# Patient Record
Sex: Female | Born: 1950 | Race: White | Hispanic: No | Marital: Single | Smoking: Current every day smoker
Health system: Southern US, Community
[De-identification: ages and names within clinical notes are randomized; demographics above are authoritative.]

## PROBLEM LIST (undated history)

## (undated) ENCOUNTER — Encounter: Attending: Anesthesiology | Primary: Anesthesiology

## (undated) ENCOUNTER — Telehealth

## (undated) ENCOUNTER — Telehealth
Attending: Student in an Organized Health Care Education/Training Program | Primary: Student in an Organized Health Care Education/Training Program

## (undated) ENCOUNTER — Ambulatory Visit: Payer: MEDICARE

## (undated) ENCOUNTER — Encounter

## (undated) ENCOUNTER — Ambulatory Visit

## (undated) ENCOUNTER — Ambulatory Visit
Payer: MEDICARE | Attending: Student in an Organized Health Care Education/Training Program | Primary: Student in an Organized Health Care Education/Training Program

## (undated) ENCOUNTER — Ambulatory Visit: Payer: Medicare (Managed Care)

## (undated) ENCOUNTER — Encounter: Attending: Physical Medicine & Rehabilitation | Primary: Physical Medicine & Rehabilitation

## (undated) ENCOUNTER — Ambulatory Visit
Payer: Medicare (Managed Care) | Attending: Student in an Organized Health Care Education/Training Program | Primary: Student in an Organized Health Care Education/Training Program

## (undated) ENCOUNTER — Telehealth: Attending: Otolaryngology | Primary: Otolaryngology

## (undated) ENCOUNTER — Encounter
Attending: Student in an Organized Health Care Education/Training Program | Primary: Student in an Organized Health Care Education/Training Program

## (undated) ENCOUNTER — Encounter: Attending: Cardiovascular Disease | Primary: Cardiovascular Disease

## (undated) ENCOUNTER — Encounter: Payer: MEDICARE | Attending: Physical Medicine & Rehabilitation | Primary: Physical Medicine & Rehabilitation

## (undated) ENCOUNTER — Telehealth: Attending: Federally Qualified Health Center (FQHC) | Primary: Federally Qualified Health Center (FQHC)

## (undated) ENCOUNTER — Ambulatory Visit: Payer: Medicare (Managed Care) | Attending: Pulmonary Disease | Primary: Pulmonary Disease

## (undated) ENCOUNTER — Encounter: Attending: Radiation Oncology | Primary: Radiation Oncology

## (undated) ENCOUNTER — Ambulatory Visit: Payer: MEDICARE | Attending: Physical Medicine & Rehabilitation | Primary: Physical Medicine & Rehabilitation

## (undated) ENCOUNTER — Encounter: Attending: Audiologist | Primary: Audiologist

## (undated) ENCOUNTER — Encounter: Attending: Naturopath | Primary: Naturopath

## (undated) ENCOUNTER — Ambulatory Visit: Attending: Physical Medicine & Rehabilitation | Primary: Physical Medicine & Rehabilitation

## (undated) ENCOUNTER — Non-Acute Institutional Stay: Payer: MEDICARE | Attending: Physical Medicine & Rehabilitation | Primary: Physical Medicine & Rehabilitation

## (undated) ENCOUNTER — Encounter: Payer: MEDICARE | Attending: Medical | Primary: Medical

## (undated) ENCOUNTER — Telehealth: Attending: Physical Medicine & Rehabilitation | Primary: Physical Medicine & Rehabilitation

## (undated) ENCOUNTER — Ambulatory Visit: Payer: MEDICARE | Attending: Otolaryngology | Primary: Otolaryngology

## (undated) ENCOUNTER — Ambulatory Visit: Attending: Family Medicine | Primary: Family Medicine

## (undated) ENCOUNTER — Encounter: Payer: MEDICARE | Attending: Cardiovascular Disease | Primary: Cardiovascular Disease

## (undated) ENCOUNTER — Encounter: Attending: Family Medicine | Primary: Family Medicine

## (undated) ENCOUNTER — Encounter: Attending: Spinal Cord Injury Medicine | Primary: Spinal Cord Injury Medicine

## (undated) DIAGNOSIS — C801 Malignant (primary) neoplasm, unspecified: Secondary | ICD-10-CM

## (undated) DIAGNOSIS — I10 Essential (primary) hypertension: Secondary | ICD-10-CM

---
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
## 2011-09-29 DIAGNOSIS — F17200 Nicotine dependence, unspecified, uncomplicated: Secondary | ICD-10-CM | POA: Insufficient documentation

## 2011-09-29 DIAGNOSIS — I70209 Unspecified atherosclerosis of native arteries of extremities, unspecified extremity: Secondary | ICD-10-CM | POA: Insufficient documentation

## 2011-09-29 DIAGNOSIS — E782 Mixed hyperlipidemia: Secondary | ICD-10-CM | POA: Insufficient documentation

## 2015-07-20 DIAGNOSIS — I10 Essential (primary) hypertension: Secondary | ICD-10-CM | POA: Insufficient documentation

## 2016-04-24 DIAGNOSIS — Z5320 Procedure and treatment not carried out because of patient's decision for unspecified reasons: Secondary | ICD-10-CM | POA: Insufficient documentation

## 2016-09-21 ENCOUNTER — Ambulatory Visit
Admission: RE | Admit: 2016-09-21 | Discharge: 2016-09-21 | Disposition: A | Discharge status: Home / Self Care | Attending: Physician Assistant

## 2016-09-21 DIAGNOSIS — G8929 Other chronic pain: Secondary | ICD-10-CM

## 2016-09-21 DIAGNOSIS — M545 Low back pain: Principal | ICD-10-CM

## 2016-09-21 MED ORDER — GABAPENTIN 300 MG CAPSULE
ORAL_CAPSULE | 2 refills | 0 days | Status: CP
Start: 2016-09-21 — End: 2016-11-02

## 2016-10-17 MED ORDER — CYCLOBENZAPRINE 10 MG TABLET
ORAL_TABLET | Freq: Every evening | ORAL | 1 refills | 0.00000 days | Status: SS
Start: 2016-10-17 — End: 2017-04-04

## 2016-10-17 MED ORDER — LISINOPRIL 20 MG-HYDROCHLOROTHIAZIDE 25 MG TABLET
ORAL_TABLET | Freq: Every day | ORAL | 1 refills | 0.00000 days | Status: SS
Start: 2016-10-17 — End: 2017-04-23

## 2016-10-17 MED ORDER — AMLODIPINE 5 MG TABLET
ORAL_TABLET | Freq: Every day | ORAL | 1 refills | 0.00000 days | Status: CP
Start: 2016-10-17 — End: 2017-04-27

## 2016-10-23 ENCOUNTER — Ambulatory Visit
Admission: RE | Admit: 2016-10-23 | Discharge: 2016-10-26 | Disposition: A | Discharge status: Home / Self Care | Attending: Rehabilitative and Restorative Service Providers" | Admitting: Rehabilitative and Restorative Service Providers"

## 2016-10-23 DIAGNOSIS — G8929 Other chronic pain: Secondary | ICD-10-CM

## 2016-10-23 DIAGNOSIS — M545 Low back pain: Principal | ICD-10-CM

## 2016-11-02 ENCOUNTER — Ambulatory Visit: Admission: RE | Admit: 2016-11-02 | Discharge: 2016-11-02

## 2016-11-02 DIAGNOSIS — H43811 Vitreous degeneration, right eye: Principal | ICD-10-CM

## 2017-01-29 MED ORDER — DICLOFENAC SODIUM 75 MG TABLET,DELAYED RELEASE
ORAL_TABLET | Freq: Two times a day (BID) | ORAL | 2 refills | 0 days | Status: CP
Start: 2017-01-29 — End: 2017-04-17

## 2017-04-02 DIAGNOSIS — G40901 Epilepsy, unspecified, not intractable, with status epilepticus: Principal | ICD-10-CM

## 2017-04-03 ENCOUNTER — Ambulatory Visit
Admit: 2017-04-03 | Discharge: 2017-04-17 | Disposition: A | Discharge status: Rehabilitation Facility | Payer: MEDICARE | Admitting: Neurology

## 2017-04-03 DIAGNOSIS — G40901 Epilepsy, unspecified, not intractable, with status epilepticus: Principal | ICD-10-CM

## 2017-04-04 DIAGNOSIS — G40901 Epilepsy, unspecified, not intractable, with status epilepticus: Principal | ICD-10-CM

## 2017-04-05 DIAGNOSIS — G40901 Epilepsy, unspecified, not intractable, with status epilepticus: Principal | ICD-10-CM

## 2017-04-06 DIAGNOSIS — G40901 Epilepsy, unspecified, not intractable, with status epilepticus: Principal | ICD-10-CM

## 2017-04-17 ENCOUNTER — Ambulatory Visit
Admission: TF | Admit: 2017-04-17 | Discharge: 2017-04-27 | Disposition: A | Discharge status: Home / Self Care | Payer: MEDICARE | Source: Intra-hospital | Admitting: Spinal Cord Injury Medicine

## 2017-04-17 MED ORDER — MULTIVITAMIN-IRON 9 MG-FOLIC ACID 400 MCG-CALCIUM AND MINERALS TABLET
Freq: Every day | ORAL | 0 refills | 0.00000 days
Start: 2017-04-17 — End: 2017-04-27

## 2017-04-17 MED ORDER — ALBUTEROL SULFATE 2.5 MG/3 ML (0.083 %) SOLUTION FOR NEBULIZATION
Freq: Three times a day (TID) | RESPIRATORY_TRACT | 11 refills | 0.00000 days
Start: 2017-04-17 — End: 2017-04-27

## 2017-04-17 MED ORDER — FLUTICASONE 250 MCG-SALMETEROL 50 MCG/DOSE BLISTR POWDR FOR INHALATION
Freq: Two times a day (BID) | RESPIRATORY_TRACT | 11 refills | 0.00000 days
Start: 2017-04-17 — End: 2017-04-27

## 2017-04-17 MED ORDER — NICOTINE (POLACRILEX) 4 MG BUCCAL LOZENGE
Freq: Every day | BUCCAL | 0 refills | 0.00000 days | PRN
Start: 2017-04-17 — End: 2017-04-27

## 2017-04-17 MED ORDER — CHLORDIAZEPOXIDE 25 MG CAPSULE
ORAL_CAPSULE | Freq: Two times a day (BID) | ORAL | 0 refills | 0.00000 days | Status: SS
Start: 2017-04-17 — End: 2017-04-20

## 2017-04-17 MED ORDER — CITALOPRAM 10 MG TABLET
ORAL_TABLET | Freq: Every day | ORAL | 0 refills | 0.00000 days
Start: 2017-04-17 — End: 2017-04-27

## 2017-04-17 MED ORDER — HYDROXYZINE HCL 25 MG TABLET
ORAL_CAPSULE | Freq: Four times a day (QID) | ORAL | 0 refills | 0.00000 days | PRN
Start: 2017-04-17 — End: 2017-04-27

## 2017-04-17 MED ORDER — FOLIC ACID 1 MG TABLET
ORAL_TABLET | Freq: Every day | ORAL | 11 refills | 0.00000 days
Start: 2017-04-17 — End: 2017-04-27

## 2017-04-17 MED ORDER — CEFDINIR 300 MG CAPSULE
Freq: Two times a day (BID) | ORAL | 0 refills | 0.00000 days
Start: 2017-04-17 — End: 2017-04-27

## 2017-04-17 MED ORDER — IPRATROPIUM BROMIDE 0.02 % SOLUTION FOR INHALATION
Freq: Three times a day (TID) | RESPIRATORY_TRACT | 12 refills | 0.00000 days
Start: 2017-04-17 — End: 2017-04-27

## 2017-04-17 MED ORDER — NICOTINE 14 MG/24 HR DAILY TRANSDERMAL PATCH
MEDICATED_PATCH | Freq: Every day | TRANSDERMAL | 0 refills | 0.00000 days | PRN
Start: 2017-04-17 — End: 2017-04-27

## 2017-04-17 MED ORDER — NICOTINE 21 MG/24 HR DAILY TRANSDERMAL PATCH
MEDICATED_PATCH | Freq: Every day | TRANSDERMAL | 0 refills | 0.00000 days
Start: 2017-04-17 — End: 2017-04-27

## 2017-04-19 MED ORDER — CHLORDIAZEPOXIDE 25 MG CAPSULE
ORAL_CAPSULE | ORAL | 0 refills | 0.00000 days
Start: 2017-04-19 — End: 2017-04-27

## 2017-04-23 MED ORDER — DOCUSATE SODIUM 100 MG CAPSULE: 100 mg | capsule | 0 refills | 0 days | Status: AC

## 2017-04-23 MED ORDER — NICOTINE 21 MG/24 HR DAILY TRANSDERMAL PATCH
0 refills | 0 days
Start: 2017-04-23 — End: 2018-04-23

## 2017-04-23 MED ORDER — FLUTICASONE 250 MCG-SALMETEROL 50 MCG/DOSE BLISTR POWDR FOR INHALATION: 1 | each | Freq: Two times a day (BID) | 0 refills | 0 days | Status: AC

## 2017-04-23 MED ORDER — MELATONIN 3 MG TABLET
Freq: Every evening | ORAL | 0 refills | 0.00000 days | Status: CP
Start: 2017-04-23 — End: 2017-04-23

## 2017-04-23 MED ORDER — LISINOPRIL 20 MG-HYDROCHLOROTHIAZIDE 25 MG TABLET
ORAL_TABLET | Freq: Every day | ORAL | 0 refills | 0.00000 days | Status: CP
Start: 2017-04-23 — End: 2018-04-30

## 2017-04-23 MED ORDER — CITALOPRAM 20 MG TABLET
ORAL_TABLET | ORAL | 0 refills | 0 days | Status: CP
Start: 2017-04-23 — End: 2018-03-19

## 2017-04-23 MED ORDER — MELATONIN 3 MG TABLET: 6 mg | tablet | Freq: Every evening | 0 refills | 0 days | Status: AC

## 2017-04-23 MED ORDER — FOLIC ACID 1 MG TABLET
ORAL_TABLET | ORAL | 0 refills | 0 days | Status: CP
Start: 2017-04-23 — End: 2018-03-19

## 2017-04-23 MED ORDER — MULTIVITAMIN-IRON 9 MG-FOLIC ACID 400 MCG-CALCIUM AND MINERALS TABLET
ORAL | 0 refills | 0 days
Start: 2017-04-23 — End: 2018-04-30

## 2017-04-23 MED ORDER — NICOTINE (POLACRILEX) 4 MG BUCCAL LOZENGE
Freq: Every day | BUCCAL | 0 refills | 0 days | Status: CP | PRN
Start: 2017-04-23 — End: 2017-05-23

## 2017-04-23 MED ORDER — OXYCODONE 5 MG TABLET
ORAL_TABLET | Freq: Four times a day (QID) | ORAL | 0 refills | 0.00000 days | Status: CP | PRN
Start: 2017-04-23 — End: 2017-04-28

## 2017-04-23 MED ORDER — GUAIFENESIN 400 MG TABLET
ORAL_TABLET | Freq: Three times a day (TID) | ORAL | 0 refills | 0 days | Status: CP
Start: 2017-04-23 — End: 2018-03-19

## 2017-04-23 MED ORDER — DOCUSATE SODIUM 100 MG CAPSULE
ORAL_CAPSULE | Freq: Two times a day (BID) | ORAL | 0 refills | 0.00000 days | Status: CP
Start: 2017-04-23 — End: 2017-04-23

## 2017-04-23 MED ORDER — FLUTICASONE 250 MCG-SALMETEROL 50 MCG/DOSE BLISTR POWDR FOR INHALATION
Freq: Two times a day (BID) | RESPIRATORY_TRACT | 0 refills | 0.00000 days | Status: CP
Start: 2017-04-23 — End: 2017-04-23

## 2017-04-23 MED ORDER — ACETAMINOPHEN 500 MG TABLET: 1000 mg | tablet | Freq: Three times a day (TID) | 0 refills | 0 days | Status: AC

## 2017-04-23 MED ORDER — AMLODIPINE 5 MG TABLET
ORAL_TABLET | ORAL | 0 refills | 0 days
Start: 2017-04-23 — End: 2017-04-23

## 2017-04-23 MED ORDER — ACETAMINOPHEN 500 MG TABLET
ORAL_TABLET | Freq: Three times a day (TID) | ORAL | 0 refills | 0.00000 days | Status: CP
Start: 2017-04-23 — End: 2017-04-23

## 2017-04-23 MED ORDER — LISINOPRIL 20 MG-HYDROCHLOROTHIAZIDE 25 MG TABLET: 1 | tablet | Freq: Every day | 0 refills | 0 days | Status: AC

## 2017-04-23 MED FILL — CITALOPRAM/20MG/TABS: CITALOPRAM/20MG/TABS | 30 days supply | Qty: 15 | Fill #0

## 2017-04-23 MED FILL — MELATONIN/3MG/TABS: MELATONIN/3MG/TABS | 50 days supply | Qty: 100 | Fill #0

## 2017-04-23 MED FILL — DOCUSATE/100MG/CAPS: DOCUSATE/100MG/CAPS | 30 days supply | Qty: 60 | Fill #0

## 2017-04-23 MED FILL — ACETAMINOPHEN/500MG/TABS: ACETAMINOPHEN/500MG/TABS | 30 days supply | Qty: 180 | Fill #0

## 2017-04-23 MED FILL — LISINOPRIL/HCTZ/20-25MG/TABS: LISINOPRIL/HCTZ/20-25MG/TABS | 30 days supply | Qty: 30 | Fill #0

## 2017-04-23 MED FILL — OXYCODONE HCL/5MG/TABS: OXYCODONE HCL/5MG/TABS | 3 days supply | Qty: 15 | Fill #0

## 2017-04-23 MED FILL — ADVAIR DISKUS/250/50/INH: ADVAIR DISKUS/250/50/INH | 30 days supply | Qty: 1 | Fill #0

## 2017-04-23 MED FILL — ASPIRIN/81MG/CHE: ASPIRIN/81MG/CHE | 36 days supply | Qty: 1 | Fill #0

## 2017-04-23 MED FILL — AMLODIPINE/5MG/TABS: AMLODIPINE/5MG/TABS | 30 days supply | Qty: 30 | Fill #0

## 2017-04-23 MED FILL — THERA-M//TAB: THERA-M//TAB | 130 days supply | Qty: 1 | Fill #0

## 2017-04-23 MED FILL — FOLIC ACID/1MG/TABS: FOLIC ACID/1MG/TABS | 30 days supply | Qty: 30 | Fill #0

## 2017-04-23 MED FILL — NICOTINE TRANSDERM/21MG/24HR/PAT: NICOTINE TRANSDERM/21MG/24HR/PAT | 28 days supply | Qty: 2 | Fill #0

## 2017-04-23 MED FILL — NICOTINE LOZENGE/4MG MINT/LOZG: NICOTINE LOZENGE/4MG MINT/LOZG | 72 days supply | Qty: 72 | Fill #0

## 2017-04-24 DIAGNOSIS — J449 Chronic obstructive pulmonary disease, unspecified: Secondary | ICD-10-CM | POA: Insufficient documentation

## 2017-04-24 MED ORDER — NICOTINE 21 MG/24 HR DAILY TRANSDERMAL PATCH
MEDICATED_PATCH | Freq: Every day | TRANSDERMAL | 0 refills | 0 days | Status: CP
Start: 2017-04-24 — End: 2017-04-24

## 2017-04-24 MED ORDER — CITALOPRAM 10 MG TABLET
ORAL_TABLET | Freq: Every day | ORAL | 0 refills | 0 days | Status: CP
Start: 2017-04-24 — End: 2018-03-19

## 2017-04-24 MED ORDER — LISINOPRIL 20 MG TABLET
ORAL_TABLET | ORAL | 0 refills | 0 days
Start: 2017-04-24 — End: 2017-04-24

## 2017-04-24 MED ORDER — FLUTICASONE 250 MCG-SALMETEROL 50 MCG/DOSE BLISTR POWDR FOR INHALATION: 1 | each | 0 refills | 0 days

## 2017-04-24 MED ORDER — FOLIC ACID 1 MG TABLET
ORAL_TABLET | Freq: Every day | ORAL | 0 refills | 0 days | Status: CP
Start: 2017-04-24 — End: 2017-04-24

## 2017-04-24 MED ORDER — ASPIRIN 81 MG CHEWABLE TABLET
ORAL_TABLET | Freq: Every day | ORAL | 0 refills | 0.00000 days | Status: CP
Start: 2017-04-24 — End: 2018-04-30

## 2017-04-24 MED ORDER — AMLODIPINE 5 MG TABLET
ORAL_TABLET | Freq: Every day | ORAL | 0 refills | 0 days | Status: CP
Start: 2017-04-24 — End: 2017-07-09

## 2017-04-24 MED ORDER — FLUTICASONE 250 MCG-SALMETEROL 50 MCG/DOSE BLISTR POWDR FOR INHALATION
Freq: Two times a day (BID) | RESPIRATORY_TRACT | 0 refills | 0.00000 days | Status: CP
Start: 2017-04-24 — End: 2018-04-24

## 2017-04-24 MED ORDER — HYDROCHLOROTHIAZIDE 25 MG TABLET
ORAL_TABLET | ORAL | 0 refills | 0 days | Status: CP
Start: 2017-04-24 — End: 2018-03-19

## 2017-04-24 MED ORDER — MELATONIN 3 MG TABLET
ORAL | 0 refills | 0 days | Status: CP
Start: 2017-04-24 — End: 2018-03-19

## 2017-04-24 MED ORDER — MULTIVITAMIN-IRON 9 MG-FOLIC ACID 400 MCG-CALCIUM AND MINERALS TABLET
ORAL_TABLET | Freq: Every day | ORAL | 0 refills | 0 days | Status: CP
Start: 2017-04-24 — End: 2017-04-24

## 2017-04-24 MED ORDER — MELATONIN 10 MG TABLET
ORAL_TABLET | Freq: Every evening | ORAL | 0 refills | 0.00000 days | Status: CP
Start: 2017-04-24 — End: 2018-03-19

## 2017-04-25 MED ORDER — HYDROCHLOROTHIAZIDE 25 MG TABLET
ORAL_TABLET | Freq: Every day | ORAL | 0 refills | 0.00000 days | Status: CP
Start: 2017-04-25 — End: 2017-04-25

## 2017-04-25 MED ORDER — LISINOPRIL 20 MG TABLET
ORAL_TABLET | Freq: Every day | ORAL | 0 refills | 0.00000 days | Status: CP
Start: 2017-04-25 — End: 2017-07-09

## 2017-07-09 ENCOUNTER — Ambulatory Visit: Admit: 2017-07-09 | Discharge: 2017-07-10 | Attending: Family Medicine | Primary: Family Medicine

## 2017-07-09 DIAGNOSIS — G40901 Epilepsy, unspecified, not intractable, with status epilepticus: Secondary | ICD-10-CM

## 2017-07-09 DIAGNOSIS — F17200 Nicotine dependence, unspecified, uncomplicated: Secondary | ICD-10-CM

## 2017-07-09 DIAGNOSIS — I10 Essential (primary) hypertension: Principal | ICD-10-CM

## 2017-07-09 DIAGNOSIS — J42 Unspecified chronic bronchitis: Secondary | ICD-10-CM

## 2017-07-09 MED ORDER — AMLODIPINE 5 MG TABLET
ORAL_TABLET | Freq: Every day | ORAL | 1 refills | 0.00000 days | Status: CP
Start: 2017-07-09 — End: 2017-07-09

## 2017-07-09 MED ORDER — LISINOPRIL 20 MG TABLET
ORAL_TABLET | Freq: Every day | ORAL | 1 refills | 0.00000 days | Status: CP
Start: 2017-07-09 — End: 2017-07-09

## 2017-07-09 MED ORDER — LISINOPRIL 20 MG TABLET: 20 mg | tablet | Freq: Every day | 1 refills | 0 days | Status: AC

## 2017-07-09 MED ORDER — AMLODIPINE 5 MG TABLET: 5 mg | tablet | Freq: Every day | 1 refills | 0 days | Status: AC

## 2017-11-13 ENCOUNTER — Ambulatory Visit: Admit: 2017-11-13 | Discharge: 2017-11-13 | Payer: MEDICARE

## 2017-11-13 ENCOUNTER — Encounter: Admit: 2017-11-13 | Discharge: 2017-11-13 | Payer: MEDICARE

## 2017-11-13 DIAGNOSIS — G8929 Other chronic pain: Secondary | ICD-10-CM

## 2017-11-13 DIAGNOSIS — M546 Pain in thoracic spine: Principal | ICD-10-CM

## 2017-11-13 DIAGNOSIS — M545 Low back pain: Principal | ICD-10-CM

## 2018-01-01 ENCOUNTER — Ambulatory Visit
Admit: 2018-01-01 | Discharge: 2018-01-30 | Payer: MEDICARE | Attending: Rehabilitative and Restorative Service Providers" | Primary: Rehabilitative and Restorative Service Providers"

## 2018-01-01 ENCOUNTER — Encounter
Admit: 2018-01-01 | Discharge: 2018-01-30 | Payer: MEDICARE | Attending: Rehabilitative and Restorative Service Providers" | Primary: Rehabilitative and Restorative Service Providers"

## 2018-01-01 DIAGNOSIS — Z7409 Other reduced mobility: Secondary | ICD-10-CM

## 2018-01-01 DIAGNOSIS — M549 Dorsalgia, unspecified: Principal | ICD-10-CM

## 2018-01-15 DIAGNOSIS — Z7409 Other reduced mobility: Secondary | ICD-10-CM

## 2018-01-15 DIAGNOSIS — M549 Dorsalgia, unspecified: Principal | ICD-10-CM

## 2018-01-29 DIAGNOSIS — M549 Dorsalgia, unspecified: Principal | ICD-10-CM

## 2018-01-29 DIAGNOSIS — Z7409 Other reduced mobility: Secondary | ICD-10-CM

## 2018-02-25 ENCOUNTER — Ambulatory Visit: Admit: 2018-02-25 | Discharge: 2018-02-25 | Disposition: A | Discharge status: Home / Self Care | Payer: MEDICARE

## 2018-02-25 ENCOUNTER — Encounter: Admit: 2018-02-25 | Discharge: 2018-02-25 | Disposition: A | Discharge status: Home / Self Care | Payer: MEDICARE

## 2018-02-25 ENCOUNTER — Emergency Department: Admit: 2018-02-25 | Discharge: 2018-02-25 | Disposition: A | Discharge status: Home / Self Care | Payer: MEDICARE

## 2018-02-25 DIAGNOSIS — M25551 Pain in right hip: Principal | ICD-10-CM

## 2018-02-25 DIAGNOSIS — J441 Chronic obstructive pulmonary disease with (acute) exacerbation: Principal | ICD-10-CM

## 2018-02-25 MED ORDER — PREDNISONE 20 MG TABLET
ORAL_TABLET | Freq: Every day | ORAL | 0 refills | 0 days | Status: CP
Start: 2018-02-25 — End: 2018-03-19

## 2018-02-25 MED ORDER — ALBUTEROL SULFATE HFA 90 MCG/ACTUATION AEROSOL INHALER
RESPIRATORY_TRACT | 0 refills | 0.00000 days | Status: CP | PRN
Start: 2018-02-25 — End: 2019-02-25

## 2018-02-25 MED ORDER — DOXYCYCLINE HYCLATE 100 MG CAPSULE
ORAL_CAPSULE | Freq: Two times a day (BID) | ORAL | 0 refills | 0.00000 days | Status: CP
Start: 2018-02-25 — End: 2018-03-19

## 2018-02-26 ENCOUNTER — Encounter
Admit: 2018-02-26 | Discharge: 2018-03-01 | Payer: MEDICARE | Attending: Rehabilitative and Restorative Service Providers" | Primary: Rehabilitative and Restorative Service Providers"

## 2018-02-26 DIAGNOSIS — M549 Dorsalgia, unspecified: Principal | ICD-10-CM

## 2018-02-26 DIAGNOSIS — Z7409 Other reduced mobility: Secondary | ICD-10-CM

## 2018-03-04 ENCOUNTER — Encounter
Admit: 2018-03-04 | Discharge: 2018-03-05 | Payer: MEDICARE | Attending: Physical Medicine & Rehabilitation | Primary: Physical Medicine & Rehabilitation

## 2018-03-04 DIAGNOSIS — M545 Low back pain: Principal | ICD-10-CM

## 2018-03-04 DIAGNOSIS — S32020S Wedge compression fracture of second lumbar vertebra, sequela: Secondary | ICD-10-CM

## 2018-03-04 DIAGNOSIS — M5136 Other intervertebral disc degeneration, lumbar region: Secondary | ICD-10-CM

## 2018-03-04 DIAGNOSIS — M47816 Spondylosis without myelopathy or radiculopathy, lumbar region: Secondary | ICD-10-CM

## 2018-03-04 DIAGNOSIS — M79604 Pain in right leg: Secondary | ICD-10-CM

## 2018-03-09 ENCOUNTER — Encounter: Admit: 2018-03-09 | Discharge: 2018-03-09 | Payer: MEDICARE

## 2018-03-09 DIAGNOSIS — M545 Low back pain: Principal | ICD-10-CM

## 2018-03-19 ENCOUNTER — Encounter
Admit: 2018-03-19 | Discharge: 2018-03-20 | Payer: MEDICARE | Attending: Cardiovascular Disease | Primary: Cardiovascular Disease

## 2018-03-19 DIAGNOSIS — I25119 Atherosclerotic heart disease of native coronary artery with unspecified angina pectoris: Principal | ICD-10-CM

## 2018-03-19 MED ORDER — METOPROLOL SUCCINATE ER 25 MG TABLET,EXTENDED RELEASE 24 HR
ORAL_TABLET | Freq: Every day | ORAL | 6 refills | 0.00000 days | Status: CP
Start: 2018-03-19 — End: 2018-05-01

## 2018-04-04 ENCOUNTER — Encounter
Admit: 2018-04-04 | Discharge: 2018-04-05 | Payer: MEDICARE | Attending: Physical Medicine & Rehabilitation | Primary: Physical Medicine & Rehabilitation

## 2018-04-04 ENCOUNTER — Encounter: Admit: 2018-04-04 | Discharge: 2018-04-05 | Payer: MEDICARE

## 2018-04-04 DIAGNOSIS — M47816 Spondylosis without myelopathy or radiculopathy, lumbar region: Principal | ICD-10-CM

## 2018-04-04 DIAGNOSIS — M47817 Spondylosis without myelopathy or radiculopathy, lumbosacral region: Principal | ICD-10-CM

## 2018-04-04 DIAGNOSIS — M7138 Other bursal cyst, other site: Secondary | ICD-10-CM

## 2018-04-12 ENCOUNTER — Encounter
Admit: 2018-04-12 | Discharge: 2018-04-13 | Payer: MEDICARE | Attending: Orthopaedic Surgery | Primary: Orthopaedic Surgery

## 2018-04-12 DIAGNOSIS — G8929 Other chronic pain: Secondary | ICD-10-CM

## 2018-04-12 DIAGNOSIS — M7061 Trochanteric bursitis, right hip: Secondary | ICD-10-CM

## 2018-04-12 DIAGNOSIS — M25551 Pain in right hip: Principal | ICD-10-CM

## 2018-04-30 ENCOUNTER — Encounter
Admit: 2018-04-30 | Discharge: 2018-05-01 | Payer: MEDICARE | Attending: Physical Medicine & Rehabilitation | Primary: Physical Medicine & Rehabilitation

## 2018-04-30 DIAGNOSIS — M5136 Other intervertebral disc degeneration, lumbar region: Principal | ICD-10-CM

## 2018-04-30 DIAGNOSIS — M79604 Pain in right leg: Principal | ICD-10-CM

## 2018-04-30 DIAGNOSIS — M5441 Lumbago with sciatica, right side: Principal | ICD-10-CM

## 2018-04-30 DIAGNOSIS — G8929 Other chronic pain: Principal | ICD-10-CM

## 2018-05-01 MED ORDER — CARVEDILOL 3.125 MG TABLET
ORAL_TABLET | Freq: Two times a day (BID) | ORAL | 6 refills | 0 days | Status: CP
Start: 2018-05-01 — End: 2018-05-31

## 2018-05-07 ENCOUNTER — Encounter: Admit: 2018-05-07 | Discharge: 2018-05-07 | Payer: MEDICARE

## 2018-05-07 DIAGNOSIS — I25119 Atherosclerotic heart disease of native coronary artery with unspecified angina pectoris: Principal | ICD-10-CM

## 2018-05-07 DIAGNOSIS — R931 Abnormal findings on diagnostic imaging of heart and coronary circulation: Principal | ICD-10-CM

## 2018-06-25 MED ORDER — TRAMADOL 50 MG TABLET
ORAL_TABLET | Freq: Four times a day (QID) | ORAL | 0 refills | 0 days | Status: CP | PRN
Start: 2018-06-25 — End: ?

## 2018-09-19 ENCOUNTER — Encounter: Admit: 2018-09-19 | Discharge: 2018-09-20 | Payer: MEDICARE

## 2018-09-19 ENCOUNTER — Encounter
Admit: 2018-09-19 | Discharge: 2018-09-20 | Payer: MEDICARE | Attending: Physical Medicine & Rehabilitation | Primary: Physical Medicine & Rehabilitation

## 2018-09-19 DIAGNOSIS — M5416 Radiculopathy, lumbar region: Principal | ICD-10-CM

## 2018-09-19 DIAGNOSIS — G8929 Other chronic pain: Secondary | ICD-10-CM

## 2018-09-19 DIAGNOSIS — M5136 Other intervertebral disc degeneration, lumbar region: Secondary | ICD-10-CM

## 2018-09-19 DIAGNOSIS — M79604 Pain in right leg: Principal | ICD-10-CM

## 2018-09-19 DIAGNOSIS — M5441 Lumbago with sciatica, right side: Secondary | ICD-10-CM

## 2018-10-14 ENCOUNTER — Encounter
Admit: 2018-10-14 | Discharge: 2018-10-15 | Payer: MEDICARE | Attending: Physical Medicine & Rehabilitation | Primary: Physical Medicine & Rehabilitation

## 2018-10-14 DIAGNOSIS — M7138 Other bursal cyst, other site: Secondary | ICD-10-CM

## 2018-10-14 DIAGNOSIS — M545 Low back pain: Secondary | ICD-10-CM

## 2018-10-14 DIAGNOSIS — M7062 Trochanteric bursitis, left hip: Secondary | ICD-10-CM

## 2018-10-14 DIAGNOSIS — M7061 Trochanteric bursitis, right hip: Secondary | ICD-10-CM

## 2018-10-14 DIAGNOSIS — M47816 Spondylosis without myelopathy or radiculopathy, lumbar region: Principal | ICD-10-CM

## 2018-11-13 ENCOUNTER — Encounter
Admit: 2018-11-13 | Discharge: 2018-11-14 | Payer: MEDICARE | Attending: Cardiovascular Disease | Primary: Cardiovascular Disease

## 2018-11-13 DIAGNOSIS — M79604 Pain in right leg: Secondary | ICD-10-CM

## 2018-11-13 DIAGNOSIS — M25472 Effusion, left ankle: Secondary | ICD-10-CM

## 2018-11-13 DIAGNOSIS — M79605 Pain in left leg: Secondary | ICD-10-CM

## 2018-11-13 DIAGNOSIS — M25471 Effusion, right ankle: Principal | ICD-10-CM

## 2018-11-14 ENCOUNTER — Encounter
Admit: 2018-11-14 | Discharge: 2018-11-14 | Payer: MEDICARE | Attending: Physical Medicine & Rehabilitation | Primary: Physical Medicine & Rehabilitation

## 2018-11-14 ENCOUNTER — Ambulatory Visit: Admit: 2018-11-14 | Discharge: 2018-11-14 | Payer: MEDICARE

## 2018-11-14 DIAGNOSIS — M7061 Trochanteric bursitis, right hip: Secondary | ICD-10-CM

## 2018-11-14 DIAGNOSIS — M25559 Pain in unspecified hip: Secondary | ICD-10-CM

## 2018-11-14 DIAGNOSIS — M47817 Spondylosis without myelopathy or radiculopathy, lumbosacral region: Secondary | ICD-10-CM

## 2018-11-14 DIAGNOSIS — M7138 Other bursal cyst, other site: Secondary | ICD-10-CM

## 2018-11-14 DIAGNOSIS — M7062 Trochanteric bursitis, left hip: Secondary | ICD-10-CM

## 2018-11-14 DIAGNOSIS — M47816 Spondylosis without myelopathy or radiculopathy, lumbar region: Secondary | ICD-10-CM

## 2018-11-20 ENCOUNTER — Encounter: Admit: 2018-11-20 | Discharge: 2018-11-21 | Payer: MEDICARE

## 2018-11-20 ENCOUNTER — Ambulatory Visit: Admit: 2018-11-20 | Discharge: 2018-11-21 | Payer: MEDICARE

## 2018-11-20 DIAGNOSIS — M25471 Effusion, right ankle: Secondary | ICD-10-CM

## 2018-11-20 DIAGNOSIS — M79605 Pain in left leg: Secondary | ICD-10-CM

## 2018-11-20 DIAGNOSIS — M25472 Effusion, left ankle: Secondary | ICD-10-CM

## 2018-11-20 DIAGNOSIS — M79604 Pain in right leg: Secondary | ICD-10-CM

## 2018-11-27 ENCOUNTER — Encounter: Admit: 2018-11-27 | Discharge: 2018-11-27 | Payer: MEDICARE

## 2018-11-27 DIAGNOSIS — R93429 Abnormal radiologic findings on diagnostic imaging of unspecified kidney: Secondary | ICD-10-CM

## 2018-11-27 DIAGNOSIS — N281 Cyst of kidney, acquired: Secondary | ICD-10-CM

## 2018-11-27 DIAGNOSIS — R93422 Abnormal radiologic findings on diagnostic imaging of left kidney: Secondary | ICD-10-CM

## 2018-11-29 ENCOUNTER — Encounter
Admit: 2018-11-29 | Discharge: 2018-11-30 | Payer: MEDICARE | Attending: Student in an Organized Health Care Education/Training Program | Primary: Student in an Organized Health Care Education/Training Program

## 2018-11-29 DIAGNOSIS — I749 Embolism and thrombosis of unspecified artery: Secondary | ICD-10-CM

## 2018-12-13 ENCOUNTER — Encounter: Admit: 2018-12-13 | Discharge: 2018-12-14 | Payer: MEDICARE

## 2018-12-13 ENCOUNTER — Encounter
Admit: 2018-12-13 | Discharge: 2018-12-14 | Payer: MEDICARE | Attending: Student in an Organized Health Care Education/Training Program | Primary: Student in an Organized Health Care Education/Training Program

## 2019-01-31 ENCOUNTER — Encounter
Admit: 2019-01-31 | Discharge: 2019-01-31 | Disposition: A | Discharge status: Home / Self Care | Payer: MEDICARE | Attending: Family Medicine

## 2019-01-31 DIAGNOSIS — J441 Chronic obstructive pulmonary disease with (acute) exacerbation: Principal | ICD-10-CM

## 2019-01-31 MED ORDER — PREDNISONE 20 MG TABLET
ORAL_TABLET | Freq: Every day | ORAL | 0 refills | 4 days | Status: CP
Start: 2019-01-31 — End: 2019-02-04

## 2019-01-31 MED ORDER — DOXYCYCLINE HYCLATE 100 MG CAPSULE
ORAL_CAPSULE | Freq: Two times a day (BID) | ORAL | 0 refills | 7.00000 days | Status: CP
Start: 2019-01-31 — End: 2019-02-07

## 2019-01-31 MED ORDER — ALBUTEROL SULFATE HFA 90 MCG/ACTUATION AEROSOL INHALER
RESPIRATORY_TRACT | 0 refills | 0 days | Status: CP | PRN
Start: 2019-01-31 — End: 2020-01-31

## 2019-03-19 ENCOUNTER — Encounter: Admit: 2019-03-19 | Discharge: 2019-03-20 | Payer: MEDICARE

## 2019-03-26 ENCOUNTER — Encounter
Admit: 2019-03-26 | Discharge: 2019-03-27 | Payer: MEDICARE | Attending: Student in an Organized Health Care Education/Training Program | Primary: Student in an Organized Health Care Education/Training Program

## 2019-03-26 MED ORDER — HYOSCYAMINE SULFATE 0.125 MG TABLET
ORAL_TABLET | ORAL | 2 refills | 15 days | Status: CP | PRN
Start: 2019-03-26 — End: ?

## 2019-04-13 ENCOUNTER — Encounter: Admit: 2019-04-13 | Discharge: 2019-04-14 | Payer: MEDICARE

## 2019-04-16 ENCOUNTER — Ambulatory Visit: Admit: 2019-04-16 | Discharge: 2019-04-17 | Payer: MEDICARE

## 2019-05-05 ENCOUNTER — Ambulatory Visit: Admit: 2019-05-05 | Discharge: 2019-05-06 | Payer: MEDICARE | Attending: Anesthesiology | Primary: Anesthesiology

## 2019-05-05 ENCOUNTER — Encounter: Admit: 2019-05-05 | Discharge: 2019-05-06 | Payer: MEDICARE

## 2019-05-05 DIAGNOSIS — M47817 Spondylosis without myelopathy or radiculopathy, lumbosacral region: Principal | ICD-10-CM

## 2019-05-17 ENCOUNTER — Ambulatory Visit: Admit: 2019-05-17 | Discharge: 2019-05-18 | Payer: MEDICARE

## 2019-05-20 ENCOUNTER — Encounter: Admit: 2019-05-20 | Discharge: 2019-05-20 | Payer: MEDICARE

## 2019-05-20 ENCOUNTER — Encounter: Admit: 2019-05-20 | Discharge: 2019-05-20 | Payer: MEDICARE | Attending: Anesthesiology | Primary: Anesthesiology

## 2019-06-03 ENCOUNTER — Encounter
Admit: 2019-06-03 | Discharge: 2019-06-04 | Payer: MEDICARE | Attending: Emergency Medicine | Primary: Emergency Medicine

## 2019-09-04 ENCOUNTER — Ambulatory Visit: Admit: 2019-09-04 | Discharge: 2019-09-05 | Payer: MEDICARE

## 2019-09-04 DIAGNOSIS — R928 Other abnormal and inconclusive findings on diagnostic imaging of breast: Principal | ICD-10-CM

## 2019-09-08 ENCOUNTER — Encounter
Admit: 2019-09-08 | Discharge: 2019-09-09 | Payer: MEDICARE | Attending: Physical Medicine & Rehabilitation | Primary: Physical Medicine & Rehabilitation

## 2019-09-11 ENCOUNTER — Encounter
Admit: 2019-09-11 | Discharge: 2019-09-12 | Payer: MEDICARE | Attending: Physical Medicine & Rehabilitation | Primary: Physical Medicine & Rehabilitation

## 2019-09-11 ENCOUNTER — Ambulatory Visit: Admit: 2019-09-11 | Discharge: 2019-09-12 | Payer: MEDICARE

## 2019-09-11 DIAGNOSIS — M47817 Spondylosis without myelopathy or radiculopathy, lumbosacral region: Principal | ICD-10-CM

## 2019-10-14 ENCOUNTER — Ambulatory Visit
Admit: 2019-10-14 | Discharge: 2019-10-15 | Payer: MEDICARE | Attending: Emergency Medicine | Primary: Emergency Medicine

## 2019-11-04 ENCOUNTER — Encounter
Admit: 2019-11-04 | Discharge: 2019-11-05 | Payer: MEDICARE | Attending: Physical Medicine & Rehabilitation | Primary: Physical Medicine & Rehabilitation

## 2019-11-04 DIAGNOSIS — M5417 Radiculopathy, lumbosacral region: Principal | ICD-10-CM

## 2019-11-04 DIAGNOSIS — M5441 Lumbago with sciatica, right side: Secondary | ICD-10-CM

## 2019-11-04 DIAGNOSIS — M5442 Lumbago with sciatica, left side: Principal | ICD-10-CM

## 2019-11-04 DIAGNOSIS — M5136 Other intervertebral disc degeneration, lumbar region: Principal | ICD-10-CM

## 2019-11-04 DIAGNOSIS — G8929 Other chronic pain: Principal | ICD-10-CM

## 2019-11-04 MED ORDER — PREGABALIN 100 MG CAPSULE
ORAL_CAPSULE | Freq: Three times a day (TID) | ORAL | 2 refills | 30.00000 days | Status: CP
Start: 2019-11-04 — End: ?

## 2019-12-06 ENCOUNTER — Encounter: Admit: 2019-12-06 | Discharge: 2019-12-07 | Payer: MEDICARE

## 2019-12-23 DIAGNOSIS — J441 Chronic obstructive pulmonary disease with (acute) exacerbation: Principal | ICD-10-CM

## 2019-12-24 ENCOUNTER — Encounter: Admit: 2019-12-24 | Discharge: 2019-12-24 | Payer: MEDICARE

## 2019-12-24 DIAGNOSIS — I2510 Atherosclerotic heart disease of native coronary artery without angina pectoris: Principal | ICD-10-CM

## 2019-12-24 DIAGNOSIS — Z87891 Personal history of nicotine dependence: Principal | ICD-10-CM

## 2019-12-24 DIAGNOSIS — J449 Chronic obstructive pulmonary disease, unspecified: Principal | ICD-10-CM

## 2019-12-24 DIAGNOSIS — F17200 Nicotine dependence, unspecified, uncomplicated: Principal | ICD-10-CM

## 2019-12-24 MED ORDER — PREDNISONE 20 MG TABLET
ORAL_TABLET | Freq: Every day | ORAL | 0 refills | 5.00000 days | Status: CP
Start: 2019-12-24 — End: ?

## 2019-12-24 MED ORDER — ALBUTEROL SULFATE 2.5 MG/3 ML (0.083 %) SOLUTION FOR NEBULIZATION
RESPIRATORY_TRACT | 3 refills | 1 days | Status: CP | PRN
Start: 2019-12-24 — End: 2020-12-23

## 2019-12-24 MED ORDER — TIOTROPIUM BROMIDE 2.5 MCG/ACTUATION MIST FOR INHALATION
Freq: Every day | RESPIRATORY_TRACT | 3 refills | 0.00000 days | Status: CP
Start: 2019-12-24 — End: ?

## 2020-01-01 ENCOUNTER — Ambulatory Visit: Admit: 2020-01-01 | Discharge: 2020-01-01 | Payer: MEDICARE

## 2020-01-01 ENCOUNTER — Encounter
Admit: 2020-01-01 | Discharge: 2020-01-01 | Payer: MEDICARE | Attending: Physical Medicine & Rehabilitation | Primary: Physical Medicine & Rehabilitation

## 2020-01-01 DIAGNOSIS — M5116 Intervertebral disc disorders with radiculopathy, lumbar region: Principal | ICD-10-CM

## 2020-01-01 DIAGNOSIS — G8929 Other chronic pain: Principal | ICD-10-CM

## 2020-01-01 DIAGNOSIS — M5442 Lumbago with sciatica, left side: Principal | ICD-10-CM

## 2020-01-01 DIAGNOSIS — M5136 Other intervertebral disc degeneration, lumbar region: Principal | ICD-10-CM

## 2020-01-01 DIAGNOSIS — M5417 Radiculopathy, lumbosacral region: Principal | ICD-10-CM

## 2020-01-01 DIAGNOSIS — M5441 Lumbago with sciatica, right side: Principal | ICD-10-CM

## 2020-01-12 ENCOUNTER — Encounter: Admit: 2020-01-12 | Discharge: 2020-01-13 | Payer: MEDICARE

## 2020-01-12 DIAGNOSIS — Z87891 Personal history of nicotine dependence: Principal | ICD-10-CM

## 2020-01-12 DIAGNOSIS — F17210 Nicotine dependence, cigarettes, uncomplicated: Principal | ICD-10-CM

## 2020-01-12 DIAGNOSIS — Z122 Encounter for screening for malignant neoplasm of respiratory organs: Principal | ICD-10-CM

## 2020-01-12 DIAGNOSIS — I25110 Atherosclerotic heart disease of native coronary artery with unstable angina pectoris: Principal | ICD-10-CM

## 2020-01-12 DIAGNOSIS — F17200 Nicotine dependence, unspecified, uncomplicated: Principal | ICD-10-CM

## 2020-01-12 DIAGNOSIS — R911 Solitary pulmonary nodule: Principal | ICD-10-CM

## 2020-01-13 DIAGNOSIS — N289 Disorder of kidney and ureter, unspecified: Principal | ICD-10-CM

## 2020-01-13 DIAGNOSIS — Z87891 Personal history of nicotine dependence: Principal | ICD-10-CM

## 2020-01-13 DIAGNOSIS — R911 Solitary pulmonary nodule: Principal | ICD-10-CM

## 2020-01-13 DIAGNOSIS — R935 Abnormal findings on diagnostic imaging of other abdominal regions, including retroperitoneum: Principal | ICD-10-CM

## 2020-02-02 ENCOUNTER — Encounter
Admit: 2020-02-02 | Discharge: 2020-02-02 | Payer: MEDICARE | Attending: Student in an Organized Health Care Education/Training Program | Primary: Student in an Organized Health Care Education/Training Program

## 2020-02-02 ENCOUNTER — Encounter: Admit: 2020-02-02 | Discharge: 2020-02-02 | Payer: MEDICARE

## 2020-02-02 DIAGNOSIS — J449 Chronic obstructive pulmonary disease, unspecified: Principal | ICD-10-CM

## 2020-02-02 DIAGNOSIS — I519 Heart disease, unspecified: Principal | ICD-10-CM

## 2020-02-02 DIAGNOSIS — I2510 Atherosclerotic heart disease of native coronary artery without angina pectoris: Principal | ICD-10-CM

## 2020-02-02 DIAGNOSIS — R0600 Dyspnea, unspecified: Principal | ICD-10-CM

## 2020-02-02 DIAGNOSIS — E785 Hyperlipidemia, unspecified: Principal | ICD-10-CM

## 2020-02-02 MED ORDER — REPATHA SURECLICK 140 MG/ML SUBCUTANEOUS PEN INJECTOR
SUBCUTANEOUS | 3 refills | 0.00000 days | Status: CP
Start: 2020-02-02 — End: ?
  Filled 2020-02-10: qty 6, 84d supply, fill #0

## 2020-02-02 MED ORDER — CARVEDILOL 3.125 MG TABLET
ORAL_TABLET | Freq: Two times a day (BID) | ORAL | 3 refills | 90 days | Status: CP
Start: 2020-02-02 — End: ?

## 2020-02-03 DIAGNOSIS — E785 Hyperlipidemia, unspecified: Principal | ICD-10-CM

## 2020-02-03 DIAGNOSIS — I2510 Atherosclerotic heart disease of native coronary artery without angina pectoris: Principal | ICD-10-CM

## 2020-02-04 MED ORDER — ALBUTEROL SULFATE 2.5 MG/3 ML (0.083 %) SOLUTION FOR NEBULIZATION
0 days
Start: 2020-02-04 — End: ?

## 2020-02-06 ENCOUNTER — Encounter
Admit: 2020-02-06 | Discharge: 2020-02-07 | Payer: MEDICARE | Attending: Emergency Medicine | Primary: Emergency Medicine

## 2020-02-06 DIAGNOSIS — M160 Bilateral primary osteoarthritis of hip: Principal | ICD-10-CM

## 2020-02-06 MED ADMIN — triamcinolone acetonide (KENALOG-40) injection 20 mg: 20 mg | INTRA_ARTICULAR | @ 21:00:00 | Stop: 2020-02-06

## 2020-02-06 NOTE — Unmapped (Signed)
ORTHOPAEDIC RETURN CLINIC NOTE     ASSESSMENT:  Tanya Gordon is a 69 y.o. female with:  1. Bilateral primary osteoarthritis of hip        PLAN:  Discussed treatment options in detail with Ms. cons that she had relief both from intra-articular and bursal injections in the past most of her pain seems to be more intra-articular at this time and she is really interested in trying a repeat shot she understands she is not a great candidate for surgery at this time but also struggling with pain and difficulty doing her exercises.  Bilateral intra-articular hip injections today done without complication    PROCEDURE NOTE:  Hip injection Bilateral  Consent   After discussing the various treatment options for the condition, It was agreed that a corticosteroid injection would be the next step in treatment. The nature of and the indications for a corticosteroid and / or local anaesthetic injection were reviewed in detail with the patient today. The inherent risks of injection including infection, bleeding, allergic reaction, increased pain, incomplete relief or temporary relief of symptoms, alterations of blood glucose levels requiring careful monitoring and treatment as indicated, tendon, ligament or articular cartilage rupture or degeneration, nerve injury, skin depigmentation, and/or fatty atrophy were discussed.     Procedure :  Bilateral Hip Intraarticular injection.  The risks and benefits of the procedure were explained,verbal consent was given, and a procedural time-out was performed. The hip joint and surrounding structures were visualized with ultrasound and the hip joint and anterior recess were identified  The site for the injection was properly marked and prepped with Chlorhexadine solution.  The injection site was anesthetized with ethyl chloride and 4cc of 1% Lidocainewith a 25 gauge 2 inch needle. Using ultrasound guidance, the hip joint was visualized and injected with 20 milligrams of Kenalog, ,and 2 cc of 0.5% Ropivicaine 3cc sterile saline using a sterile technique and a 22 gauge 3.5 inch needle. During the injection, there was unrestricted flow and care was taken not to inject corticosteroid into the skin or subcutaneous tissues. There were no complications during the procedure.    NEED FOR SONOGRAPHIC GUIDANCE    Given the complexity of this problem, the anatomic location of this structure, sonographic guidance is recommended to prevent injury to neurovascular structures and confirm accuracy of injection. The accuracy of doing these injections blind is poor and the benefit to the patient by using ultrasound guidance is significant to avoid complications.     Reference:  American Medical Society for Sports Medicine (AMSSM) position statement: interventional musculoskeletal ultrasound in sports medicine.  [name], [name], [name], [name], [name] Sports Med. 2014 Oct 20. pii: [id number]. doi: 10.1136/[id number]    Post procedure a sterile band-aide was applied. Post-injection instructions were given regarding post-procedure care, when to follow up in clinic and what to expect from the procedure. The patient tolerated the injection well and was discharged without complication.        Follow Up: Return if symptoms worsen or fail to improve.     SUBJECTIVE:   Diagnosis ICD-10-CM Associated Orders   1. Bilateral primary osteoarthritis of hip  M16.0 triamcinolone acetonide (KENALOG-40) injection 20 mg     triamcinolone acetonide (KENALOG-40) injection 20 mg       History of Present Illness:   70 y.o. female coming for follow up evaluation of bilateral hip pain seen a few times for bilateral hip pain.  Had IA injection at first visit no sig relief.  Then in Auguest has [name] injection which also helped some.   SHe feels like he prior IA injection eased the pain but not long-lasting she comes today to discuss options.      All relevant orthopedic medical chart documents reviewed.    Changes to medical history since last clinic visit per patient questionnaire  None.   Surgical History   Past Surgical History:   Procedure Laterality Date   ??? by pass surgery  2006    Aotic Femoral bypass [name])    ??? FRACTURE SURGERY      Left wrist-metal in place   ??? PR COLONOSCOPY W/BIOPSY SINGLE/MULTIPLE N/A 05/20/2019    Procedure: COLONOSCOPY, FLEXIBLE, PROXIMAL TO SPLENIC FLEXURE; WITH BIOPSY, SINGLE OR MULTIPLE;  Surgeon: Leland Her, MD;  [facility]: [facility];  Service: Gastroenterology   ??? PR COLSC FLX W/RMVL OF TUMOR POLYP LESION SNARE TQ N/A 05/20/2019    Procedure: COLONOSCOPY FLEX; W/REMOV TUMOR/LES BY SNARE;  Surgeon: Leland Her, MD;  Location: [facility];  Service: Gastroenterology      Medications   Current Outpatient Medications   Medication Sig Dispense Refill   ??? albuterol 2.5 mg /3 mL (0.083 %) nebulizer solution      ??? alendronate (FOSAMAX) 70 MG tablet  (Patient not taking: Reported on 02/02/2020)     ??? amLODIPine (NORVASC) 5 MG tablet Take 1 tablet (5 mg total) by mouth daily. 90 tablet 1   ??? aspirin 81 MG chewable tablet Chew 1 tablet (81 mg total) daily. 30 tablet 0   ??? carvediloL (COREG) 3.125 MG tablet Take 1 tablet (3.125 mg total) by mouth 2 (two) times a day with meals. 180 tablet 3   ??? cyclobenzaprine (FLEXERIL) 10 MG tablet 10 mg Three (3) times a day as needed for muscle spasms.  (Patient not taking: Reported on 02/02/2020)     ??? evolocumab (REPATHA SURECLICK) 140 mg/mL PnIj Inject 140 mg under the skin every fourteen (14) days. 6 mL 3   ??? lisinopriL (PRINIVIL,ZESTRIL) 10 MG tablet Take 1 tablet by mouth daily.     ??? naproxen (NAPROSYN) 500 MG tablet Take 500 mg by mouth two (2) times a day as needed.  (Patient not taking: Reported on 02/02/2020)     ??? omeprazole (PRILOSEC) 20 MG capsule Take 1 capsule by mouth daily.     ??? predniSONE (DELTASONE) 20 MG tablet Take 2 tablets (40 mg total) by mouth daily. As directed by your pulmonologist. (Patient not taking: Reported on 02/02/2020) 10 tablet 0   ??? tiotropium bromide (SPIRIVA RESPIMAT) 2.5 mcg/actuation inhalation mist Inhale 2 puffs daily. 4 g 3   ??? traMADoL (ULTRAM) 50 mg tablet Take 2 tablets (100 mg total) by mouth every six (6) hours as needed for pain. (Patient not taking: Reported on 02/02/2020) 40 tablet 0     No current facility-administered medications for this visit.      Allergies   Atorvastatin, Carvedilol, and Metoprolol       Review of Systems  .   Marland Kitchen       OBJECTIVE:     ??    DETAILED PHYSICAL EXAM  General Appearance ?? well-nourished and no acute distress   Mood and Affect ?? alert, cooperative and pleasant   Gait and Station ?? Antalgic gait   Cardiovascular ?? well-perfused distally and no swelling   Sensation ?? Sensation intact to light touch  distally   MUSCULOSKELETAL    Right Hip ?? Inspection/palpation: No swelling, erythema, deformity, atrophy or hypertrophy noted  ?? Range of motion: diminished range with pain.        - Flexion: 120 Degrees        - Internal rotation: 40 deg        - External Rotation 60 deg  ?? Strength: WNL  ?? Skin: No laceration, abrasion, ecchymosis, or other skin abnormality  ?? FABERS: Negative   ?? FADERS: positive    Left Hip ?? Inspection/palpation: No swelling, erythema, deformity, atrophy or hypertrophy noted  ?? Range of motion: diminished range with pain.        - Flexion: 120 Degrees        - Internal rotation: 40 deg        - External Rotation 60 deg  ?? Strength: WNL  ?? Skin: No laceration, abrasion, ecchymosis, or other skin abnormality  ?? FABERS: Negative   ?? FADERS: positive          Test Results  Imaging  Imaging reviewed inculded:Xray.  I reviewed the inages personally as well as relevant associated reports    This note was dictated using Scientist, clinical (histocompatibility and immunogenetics).  Please excuse any spelling or grammatical errors.     MEDICAL DECISION MAKING (level of service defined by 2/3 elements)     Number/Complexity of Problems Addressed 1 stable chronic illness (99203/99213)   Amount/Complexity of Data to be Reviewed/Analyzed Independent interpretation of a test performed by another physician/other qualified health care professional (99204/99214)   Risk of Complications/Morbidity/Mortality of Management Injectable Medication (99204/99214)       Outcome Score Function       Outcome Score Mental Health

## 2020-02-06 NOTE — Unmapped (Signed)
[facility]   Patient Onboarding/Medication Counseling    Tanya Gordon is a 69 y.o. female with hyperlipidemia who I am counseling today on initiation of therapy.  I am speaking to the patient.    Was a Nurse, learning disability used for this call? No    Verified patient's date of birth / HIPAA.    Specialty medication(s) to be sent: General Specialty: Repatha      Non-specialty medications/supplies to be sent: Higher education careers adviser      Medications not needed at this time: n/a         Repatha (evolocumab)    Medication & Administration     Dosage: Inject the contents of 1 pen (140mg ) under the skin every 2 weeks.    Administration: Administer under the skin of the abdomen, thigh or upper arm. Rotate sites with each injection.  ??? Injection instructions - Autoinjector:  o Remove 1 Repatha autoinjector from the refrigerator and let stand at room temperature for at least 30 minutes.  o Check the autoinjector for the following:  - Expiration date  - Absence of any cracks or damage  - The medicine is clear and colorless and does not contain any particles  - The orange cap is present and securely attached  o Choose your injection site and clean with an alcohol wipe. Allow to air dry completely.  o Pull the orange cap straight off and discard  o Pinch the skin (or stretch) with your thumb and fingers creating an area 2 inches wide  o Maintaining the pinch (or stretch) press the pen to your skin at a 90 degree angle. Firmly push the autoinjector down until the skin stops moving and the yellow safety guard is no longer visible.  o Do not touch the gray start button yet  o When you are ready to inject, press the gray start button. You will hear a click that signals the start of the injection  o Continue to press the pen to your skin and lift your thumb  o The injection may take up to 15 seconds. You will know the injection is complete when the medication window turns yellow. You may also hear a second click.  o Remove the pen from your skin and discard the pen in a sharps container.  o If there is blood at the injection site, press a cotton ball or gauze to the site. Do not rub the injection site.    Adherence/Missed dose instructions: Administer a missed dose within 7 days and resume your normal schedule.  If it has been more than 7 days and you inject every 2 weeks, skip the missed dose and resume your normal schedule..     Goals of Therapy     Lower cholesterol, prevention of cardiovascular events in patients with established cardiovascular disease    Side Effects & Monitoring Parameters   ??? Flu-like symptoms  ??? Signs of a common cold  ??? Back pain  ??? Injection site irritation  ??? Nose or throat irritation    The following side effects should be reported to the provider:  ??? Signs of an allergic reaction  ??? Signs of high blood sugar (confusion, drowsiness, increase thirst/hunger/urination, fast breathing, flushing)      Contraindications, Warnings, & Precautions     ??? Latex (the packaging of Repatha may contain natural rubber)    Drug/Food Interactions     ??? Medication list reviewed in Epic. The patient was instructed to inform the care  team before taking any new medications or supplements. No drug interactions identified.     Storage, Handling Precautions, & Disposal   ??? Repatha should be stored in the refrigerator. If necessary, Repatha may be kept at room temperature for no more than 30 days.  ??? Place used devices in a sharps container for disposal.      Current Medications (including OTC/herbals), Comorbidities and Allergies     Current Outpatient Medications   Medication Sig Dispense Refill   ??? alendronate (FOSAMAX) 70 MG tablet  (Patient not taking: Reported on 02/02/2020)     ??? amLODIPine (NORVASC) 5 MG tablet Take 1 tablet (5 mg total) by mouth daily. 90 tablet 1   ??? aspirin 81 MG chewable tablet Chew 1 tablet (81 mg total) daily. 30 tablet 0   ??? carvediloL (COREG) 3.125 MG tablet Take 1 tablet (3.125 mg total) by mouth 2 (two) times a day with meals. 180 tablet 3   ??? cyclobenzaprine (FLEXERIL) 10 MG tablet 10 mg Three (3) times a day as needed for muscle spasms.  (Patient not taking: Reported on 02/02/2020)     ??? evolocumab (REPATHA SURECLICK) 140 mg/mL PnIj Inject 140 mg under the skin every fourteen (14) days. 6 mL 3   ??? lisinopriL (PRINIVIL,ZESTRIL) 10 MG tablet Take 1 tablet by mouth daily.     ??? naproxen (NAPROSYN) 500 MG tablet Take 500 mg by mouth two (2) times a day as needed.  (Patient not taking: Reported on 02/02/2020)     ??? omeprazole (PRILOSEC) 20 MG capsule Take 1 capsule by mouth daily.     ??? predniSONE (DELTASONE) 20 MG tablet Take 2 tablets (40 mg total) by mouth daily. As directed by your pulmonologist. (Patient not taking: Reported on 02/02/2020) 10 tablet 0   ??? pregabalin (LYRICA) 100 MG capsule Take 1 capsule (100 mg total) by mouth Three (3) times a day. (Patient not taking: Reported on 12/24/2019) 90 capsule 2   ??? SPIRIVA WITH HANDIHALER 18 mcg inhalation capsule Take 1 inhalation by mouth daily.     ??? tiotropium bromide (SPIRIVA RESPIMAT) 2.5 mcg/actuation inhalation mist Inhale 2 puffs daily. 4 g 3   ??? traMADoL (ULTRAM) 50 mg tablet Take 2 tablets (100 mg total) by mouth every six (6) hours as needed for pain. (Patient not taking: Reported on 02/02/2020) 40 tablet 0     No current facility-administered medications for this visit.       Allergies   Allergen Reactions   ??? Atorvastatin Other (See Comments)     Muscle pain    ??? Carvedilol Diarrhea   ??? Metoprolol Diarrhea       Patient Active Problem List   Diagnosis   ??? Tobacco use disorder   ??? Mixed hyperlipidemia   ??? Essential hypertension   ??? Colonoscopy refused   ??? 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 Mammogram declined   ??? Status epilepticus (CMS-HCC)   ??? COPD (chronic obstructive pulmonary disease) (CMS-HCC)       Reviewed and up to date in Epic.    Appropriateness of Therapy     Is medication and dose appropriate based on diagnosis? Yes    Prescription has been clinically reviewed: Yes    Baseline Quality of Life Assessment      How many days over the past month did your hyperlipidemia  keep you from your normal activities? For example, brushing your teeth or getting up in the morning. Patient declined to  answer    Financial Information     Medication Assistance provided: Prior Authorization    Anticipated copay of $0 reviewed with patient. Verified delivery address.    Delivery Information     Scheduled delivery date: 02/11/20    Expected start date: 02/11/20    Medication will be delivered via UPS to the prescription address in [facility].  This shipment will not require a signature.      Explained the services we provide at [facility] and that each month we would call to set up refills.  Stressed importance of returning phone calls so that we could ensure they receive their medications in time each month.  Informed patient that we should be setting up refills 7-10 days prior to when they will run out of medication.  A pharmacist will reach out to perform a clinical assessment periodically.  Informed patient that a welcome packet and a drug information handout will be sent.      Patient verbalized understanding of the above information as well as how to contact the pharmacy at [phone] option 4 with any questions/concerns.  The pharmacy is open Monday through Friday 8:30am-4:30pm.  A pharmacist is available 24/7 via pager to answer any clinical questions they may have.    Patient Specific Needs     - Does the patient have any physical, cognitive, or cultural barriers? No    - Patient prefers to have medications discussed with  Patient     - Is the patient or caregiver able to read and understand education materials at a high school level or above? Yes    - Patient's primary language is  English     - Is the patient high risk? No    - Does the patient require a Care Management Plan? No     - Does the patient require physician intervention or other additional services (i.e. nutrition, smoking cessation, social work)? No      [name]  [facility] [address] [facility] Specialty Pharmacist

## 2020-02-06 NOTE — Unmapped (Signed)
[facility] Specialty Medication Onboarding    Specialty Medication: Repatha  Prior Authorization: Approved   Financial Assistance: No - copay  <$25  Final Copay/Day Supply: $0 / 84    Insurance Restrictions: None     Notes to Pharmacist:     The triage team has completed the benefits investigation and has determined that the patient is able to fill this medication at [facility]. Please contact the patient to complete the onboarding or follow up with the prescribing physician as needed.

## 2020-02-07 NOTE — Unmapped (Signed)
Thank you for coming to [facility] and our clinic today!   We aim to provide you with the highest quality, individualized care.  If you have any unanswered questions   after the visit, please do not hesitate to reach out to [place] on MyChart or leave a message on our nurse   line at [phone].    If you need to schedule future appointments, please call [phone].     We look forward to seeing you again in the future and appreciate you choosing [facility] for your care!    Thank you,    Dr. Nettie Elm, MD            We provide innovative and comprehensive patient centered care that is supported by evidence-based research                                                                                                    COMING SOON!    Please check out our current research studies to see if you or someone you know may qualify at:    [email or web address]  Intra-articular Hip injection for Hip arthritis    You had a steroid injection today for your hip osteoarthritis. The steroid we use is called Kenalog.     You received a corticosteroid injection to reduce pain and inflammation.  Please note that it can take up to 2 weeks for this injection to fully work.  While many people will feel relief sooner, please be patient.      The injection contained a corticosteroid and a numbing agent.  The numbing agent can last for 1-6 hours.  After this wears off you may have increased pain until the steroid has a chance to work.    What are some of the possible side effects of a steroid injection?    Common side effects:  ??? temporarily elevated blood sugar (in diabetic patients) that can last a few days   ??? flushing of the skin, especially the face  ??? temporary rise in blood pressure  ??? discoloration or atrophy of the skin at the injection site    Call your doctor at once if you have:  ??? persistent worsening pain or swelling, fever;  ??? blurred vision, tunnel vision, eye pain, or seeing halos around lights;  ??? fast or slow heartbeats;  ??? increased blood pressure that is associated with severe headache, blurred vision, pounding in your neck or ears, anxiety, nosebleed;  ??? headaches, ringing in your ears, dizziness, nausea, vision problems, pain behind your eyes    This is not a complete list of side effects and others may occur. Call your doctor for medical advice about side effects.     What other drugs may be affected after the injection?  Many drugs can interact with steroids. Not all possible interactions are listed here. Tell your doctor about all your current medicines and any you start or stop using, especially:  ??? an antibiotic or antifungal medication;  ??? birth control pills or hormone replacement therapy;  ??? a blood thinner (warfarin, Coumadin, and  others);  ??? a diuretic or water pill;  ??? insulin or oral diabetes medicine;  ??? medicine to treat tuberculosis;  ??? a nonsteroidal anti-inflammatory drug or NSAID (aspirin, ibuprofen, naproxen, diclofenac, indomethacin, Advil, Aleve, Celebrex, and many others); or  ??? seizure medication.      You can resume your normal daily activities, but consider resting the injected area for the next few days.             You can resume your normal daily activities, but you should not seek out any lower body exercise or vigorous activity of any kind for the next 2 weeks to give your body a chance to let the steroids take effect.     Plan to limit your walking for the next two weeks to just activities of daily living. You can take Tylenol as needed before the steroids begin to work. You may notice your hip feels full after the injection.  This should settle down after a couple of hours. You should not soak in a bath or get in a pool for 24 hours following the injection.         Patient Education        Hip Arthritis: Exercises  Introduction  Here are some examples of exercises for you to try. The exercises may be suggested for a condition or for rehabilitation. Start each exercise slowly. Ease off the exercises if you start to have pain.  You will be told when to start these exercises and which ones will work best for you.  How to do the exercises  Straight-leg raises to the outside    1. Lie on your side, with your affected hip on top.  2. Tighten the front thigh muscles of your top leg to keep your knee straight.  3. Keep your hip and your leg straight in line with the rest of your body, and keep your knee pointing forward. Do not drop your hip back.  4. Lift your top leg straight up toward the ceiling, about 12 inches off the floor. Hold for about 6 seconds, then slowly lower your leg.  5. Repeat 8 to 12 times.  6. Switch legs and repeat steps 1 through 5, even if only one hip is sore.  Straight-leg raises to the inside    1. Lie on your side with your affected hip on the floor.  2. You can either prop up your other leg on a chair, or you can bend that knee and put that foot in front of your other knee. Do not drop your hip back.  3. Tighten the muscles on the front thigh of your bottom leg to straighten that knee.  4. Keep your kneecap pointing forward and your leg straight, and lift your bottom leg up toward the ceiling about 6 inches. Hold for about 6 seconds, then lower slowly.  5. Repeat 8 to 12 times.  6. Switch legs and repeat steps 1 through 5, even if only one hip is sore.  Hip hike    1. Stand sideways on the bottom step of a staircase, and hold on to the banister or wall.  2. Keeping both knees straight, lift your good leg off the step and let it hang down. Then hike your good hip up to the same level as your affected hip or a little higher.  3. Repeat 8 to 12 times.  4. Switch legs and repeat steps 1 through 3, even if only one hip is sore.  Bridging    1. Lie on your back with both knees bent. Your knees should be bent about 90 degrees.  2. Then push your feet into the floor, squeeze your buttocks, and lift your hips off the floor until your shoulders, hips, and knees are all in a straight line.  3. Hold for about 6 seconds as you continue to breathe normally, and then slowly lower your hips back down to the floor and rest for up to 10 seconds.  4. Repeat 8 to 12 times.  Hamstring stretch (lying down)    1. Lie flat on your back with your legs straight. If you feel discomfort in your back, place a small towel roll under your lower back.  2. Holding the back of your affected leg, lift your leg straight up and toward your body until you feel a stretch at the back of your thigh.  3. Hold the stretch for at least 30 seconds.  4. Repeat 2 to 4 times.  5. Switch legs and repeat steps 1 through 4, even if only one hip is sore.  Standing quadriceps stretch    1. If you are not steady on your feet, hold on to a chair, counter, or wall. You can also lie on your stomach or your side to do this exercise.  2. Bend the knee of the leg you want to stretch, and reach behind you to grab the front of your foot or ankle with the hand on the same side. For example, if you are stretching your right leg, use your right hand.  3. Keeping your knees next to each other, pull your foot toward your buttock until you feel a gentle stretch across the front of your hip and down the front of your thigh. Your knee should be pointed directly to the ground, and not out to the side.  4. Hold the stretch for at least 15 to 30 seconds.  5. Repeat 2 to 4 times.  6. Switch legs and repeat steps 1 through 5, even if only one hip is sore.  Hip rotator stretch    1. Lie on your back with both knees bent and your feet flat on the floor.  2. Put the ankle of your affected leg on your opposite thigh near your knee.  3. Use your hand to gently push your knee away from your body until you feel a gentle stretch around your hip.  4. Hold the stretch for 15 to 30 seconds.  5. Repeat 2 to 4 times.  6. Repeat steps 1 through 5, but this time use your hand to gently pull your knee toward your opposite shoulder.  7. Switch legs and repeat steps 1 through 6, even if only one hip is sore.  Knee-to-chest    1. Lie on your back with your knees bent and your feet flat on the floor.  2. Bring your affected leg to your chest, keeping the other foot flat on the floor (or keeping the other leg straight, whichever feels better on your lower back).  3. Keep your lower back pressed to the floor. Hold for at least 15 to 30 seconds.  4. Relax, and lower the knee to the starting position.  5. Repeat 2 to 4 times.  6. Switch legs and repeat steps 1 through 5, even if only one hip is sore.  7. To get more stretch, put your other leg flat on the floor while pulling your knee to your chest.  Clamshell  1. Lie on your side, with your affected hip on top. Keep your feet and knees together and your knees bent.  2. Raise your top knee, but keep your feet together. Do not let your hips roll back. Your legs should open up like a clamshell.  3. Hold for 6 seconds.  4. Slowly lower your knee back down. Rest for 10 seconds.  5. Repeat 8 to 12 times.  6. Switch legs and repeat steps 1 through 5, even if only one hip is sore.  Follow-up care is a key part of your treatment and safety. Be sure to make and go to all appointments, and call your doctor if you are having problems. It's also a good idea to know your test results and keep a list of the medicines you take.  Where can you learn more?  Go to [facility] at https://myuncchart.org  Select Patient Education under [employer]. Enter [phone] in the search box to learn more about Hip Arthritis: Exercises.  Current as of: August 28, 2019??????????????????????????????Content Version: 13.0  ?? 2006-2021 [employer].   Care instructions adapted under license by [facility]. If you have questions about a medical condition or this instruction, always ask your healthcare professional. [employer] disclaims any warranty or liability for your use of this information.

## 2020-02-09 MED ORDER — EMPTY CONTAINER
2 refills | 0 days
Start: 2020-02-09 — End: ?

## 2020-02-10 MED FILL — REPATHA SURECLICK 140 MG/ML SUBCUTANEOUS PEN INJECTOR: 84 days supply | Qty: 6 | Fill #0 | Status: AC

## 2020-02-10 MED FILL — EMPTY CONTAINER: 120 days supply | Qty: 1 | Fill #0 | Status: AC

## 2020-02-10 MED FILL — EMPTY CONTAINER: 120 days supply | Qty: 1 | Fill #0

## 2020-03-18 NOTE — Unmapped (Signed)
Please contact pt. to set up a virtual appt. to discuss possible RFA.

## 2020-03-22 NOTE — Unmapped (Signed)
Otolaryngology New Consult Visit Note      Reason for Visit:  Ear pain    History of Present Illness    Tanya Gordon is 70 y.o. female being seen in consultation at the request of Hughie Closs, MD  for evaluation of ear pain. Treated for presumed ear infection - abx, steroids, ear drop otc which did not improve it. Bilateral tinnitus 10+ years. Pain occurs when she swallows or yawns. Stabbing pain since Dec 2021, each time it occurs it lasts a few seconds.  Is a current smoker has reduced in half. Had been on gabapentin for back and hip and stopped this past summer as she felt it was not helping much.       The patient denies shortness of breath, dysphagia, changes in voice quality or unexplained weight loss    Past Medical History     has a past medical history of Alcoholism (CMS-HCC), COPD (chronic obstructive pulmonary disease) (CMS-HCC), GERD (gastroesophageal reflux disease), Hyperlipidemia (Years ago), Hypertension, Status epilepticus (CMS-HCC), and Stroke (CMS-HCC) (2013).    Past Surgical History     has a past surgical history that includes by pass surgery (2006); Fracture surgery; pr colonoscopy w/biopsy single/multiple (N/A, 05/20/2019); and pr colsc flx w/rmvl of tumor polyp lesion snare tq (N/A, 05/20/2019).    Current Medications    Current Outpatient Medications   Medication Sig Dispense Refill   ??? albuterol 2.5 mg /3 mL (0.083 %) nebulizer solution      ??? alendronate (FOSAMAX) 70 MG tablet      ??? amLODIPine (NORVASC) 5 MG tablet Take 1 tablet (5 mg total) by mouth daily. 90 tablet 1   ??? aspirin 81 MG chewable tablet Chew 1 tablet (81 mg total) daily. 30 tablet 0   ??? carvediloL (COREG) 3.125 MG tablet Take 1 tablet (3.125 mg total) by mouth 2 (two) times a day with meals. 180 tablet 3   ??? cyclobenzaprine (FLEXERIL) 10 MG tablet 10 mg Three (3) times a day as needed for muscle spasms.      ??? empty container (SHARPS-A-GATOR DISPOSAL SYSTEM) Misc Use as directed for sharps disposal 1 each 2   ??? evolocumab (REPATHA SURECLICK) 140 mg/mL PnIj Inject the contents of 1 pen (140 mg) under the skin every fourteen (14) days. 6 mL 3   ??? lisinopriL (PRINIVIL,ZESTRIL) 10 MG tablet Take 20 mg by mouth daily.      ??? naproxen (NAPROSYN) 500 MG tablet Take 500 mg by mouth two (2) times a day as needed.      ??? omeprazole (PRILOSEC) 20 MG capsule Take 1 capsule by mouth daily.     ??? predniSONE (DELTASONE) 20 MG tablet Take 2 tablets (40 mg total) by mouth daily. As directed by your pulmonologist. (Patient taking differently: Take 40 mg by mouth once as needed. As directed by your pulmonologist.) 10 tablet 0   ??? tiotropium bromide (SPIRIVA RESPIMAT) 2.5 mcg/actuation inhalation mist Inhale 2 puffs daily. 4 g 3   ??? traMADoL (ULTRAM) 50 mg tablet Take 2 tablets (100 mg total) by mouth every six (6) hours as needed for pain. (Patient taking differently: Take 100 mg by mouth once as needed for pain. ) 40 tablet 0     No current facility-administered medications for this visit.       Allergies    Allergies   Allergen Reactions   ??? Atorvastatin Other (See Comments)     Muscle pain    ??? Carvedilol Diarrhea   ???  Metoprolol Diarrhea       Family History    family history includes Alcohol abuse in her brother, father, maternal grandmother, mother, and sister; Asthma in her brother and sister; COPD in her brother and sister; Diabetes in her sister; Early death in her father; Glaucoma in her mother; Heart disease in her father, mother, and sister; Hyperlipidemia in her sister; Hypertension in her brother, father, and sister; Neuropathy in her mother; Stroke in her maternal grandmother and mother.    Social History:     reports that she has been smoking cigarettes. She started smoking about 47 years ago. She has a 40.00 pack-year smoking history. She has never used smokeless tobacco.   reports previous alcohol use.   reports no history of drug use.    Review of Systems    A full  review of systems is documented and negative except as noted in HPI.  Patient intake form reviewed.    Vital Signs  Pulse 71  - Resp 17  - SpO2 96%     Physical Exam    General: Well-developed, well-nourished. Appropriate, comfortable, and in no apparent distress.  Head/Face: On external examination there is no obvious asymmetry or scars. On palpation there is no tenderness over maxillary sinuses or masses within the salivary glands. Cranial nerves V and VII are intact through all distributions.  Eyes: PERRL, EOMI, the conjunctiva are not injected and sclera is non-icteric.  Ears:   Right ear: On external exam, there is no obvious lesions or asymmetry. occluded with cerumen. No lesions in the EAC. unable to visualize TM due to wax  Left ear: On external exam, there is no obvious lesions or asymmetry. occluded with cerumen. No lesions in the EAC. unable to visualize TM due to wax  Hearing is grossly intact  Nose:  No external masses, lesions, or deformity. Inspection of nasal mucosa, septum, and turbinates reveals normal nasal exam.   Oral cavity/oropharynx: The mucosa of the lips, gums, hard and soft palate, posterior pharyngeal wall, tongue, floor of mouth, and buccal region are without masses or lesions and are normally hydrated. fair dentitionoropharynx with normal mucosa no lesions or masses noted, uvula midline, tongue protrudes midlineTonsils are are symmetric without any masses or lesions and 1+  [id number] crisp, unable to visualize vocal cords due to gag reflex  Voice: clear   Neck/Lymphatics: There is no asymmetry or masses. Trachea is midline. There is no enlargement of the thyroid or palpable thyroid nodules. There is no palpable lymphadenopathy along the jugulodiagastric, submental, or posterior cervical chains.  Respiratory: No audible wheeze, unlabored respirations.  Neurologic: Cranial nerve???s II-XII are grossly intact. Exam is non-focal.  Cardiovascular: No cyanosis, clubbing or edema.    Procedure  Flexible Fiberoptic Nasopharyngolaryngoscopy (CPT [id number]): To better evaluate the patient???s symptoms, fiberoptic laryngoscopy is indicated.  After discussion of risks and benefits, and topical decongestion and anesthesia, the endoscopy was performed without complications. A time out identifying the patient, the procedure, the location of the procedure and any concerns was performed prior to beginning the procedure.    Findings:  Absent adenoidal pad. Bilateral patent Eustachian tube orifices. Healthy, symmetric nasopharyngeal and pharyngeal mucosa with no masses, lesions, or friable mucosa. Symmetric base of tongue with clear vallecula bilaterally. No lingual tonsil hypertrophy.    Inspection of the larynx reveals no evidence of supraglottic or glottic masses. Epiglottis crisp. There is bilateral true vocal cord full range of motion with glottic competence. The piriform sinuses are clear bilaterally. No  pooling of secretions in the piriform sinuses. Very dry nasal cavities bilaterally, mm clear bilat.      Audiogram  The audiogram was reviewed. 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 female with a history of  has a past medical history of Alcoholism (CMS-HCC), COPD (chronic obstructive pulmonary disease) (CMS-HCC), GERD (gastroesophageal reflux disease), Hyperlipidemia (Years ago), Hypertension, Status epilepticus (CMS-HCC), and Stroke (CMS-HCC) (2013). 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 She is agreeable to trying gabapentin again, this time for the ear and will titrate up. Had been taking 3600 a day last summer but will start with 100 qd and titrate up to TID this month. Recheck in 4 wks.     You were seen by an Ears, Nose and Throat doctor for hearing loss. You have asymmetric hearing loss meaning that one ear is worse than the other. There is a small chance that this is due to a mass (called an acoustic neuroma or vestibular schwannoma) in the nerve of your ear. To make sure that there is not a mass there, a MRI of the brain is recommended. We discussed the risk and benefits of getting the scan vs. Observation.     You decided to procede with MRI. I will call you with the results.         The patient voiced complete understanding of plan as detailed above and is in full agreement.

## 2020-03-24 ENCOUNTER — Encounter: Admit: 2020-03-24 | Discharge: 2020-03-25 | Payer: MEDICARE

## 2020-03-24 DIAGNOSIS — F17200 Nicotine dependence, unspecified, uncomplicated: Principal | ICD-10-CM

## 2020-03-24 DIAGNOSIS — J449 Chronic obstructive pulmonary disease, unspecified: Principal | ICD-10-CM

## 2020-03-24 DIAGNOSIS — R0902 Hypoxemia: Principal | ICD-10-CM

## 2020-03-24 DIAGNOSIS — Z87891 Personal history of nicotine dependence: Principal | ICD-10-CM

## 2020-03-24 NOTE — Unmapped (Signed)
Pulmonary Clinic - Initial Visit    Referring Physician :  Clydene Fake  PCP:     Hughie Closs, MD  Reason for Consult:   COPD       ASSESSMENT and PLAN     Emmaleah Meroney is a 70 y.o. female with a history of COPD, active tobacco use disorder (80 pack year smoking history), HLD, HTN, prior alcohol use disorder (quit in 208, with withdrawal seizures),Peripheral vascular disease, possibly aortobifemoral bypass in 2006, and CAD whom we are seeing in consultation requested by Clydene Fake for evaluation of COPD    COPD: Grade 2B at this time, spirometry proven.  She is currently not in exacerbation.  -She qualifies for low-dose chest CT to screen for lung cancer as she is actively smoking, has a 80-pack-year history of smoking, and is over the age of 30.  We discussed the risks and benefits of this and elected to proceed with screening that was completed 01/12/2020. This demonstrated a stable LLL nodule, 0.4 cm, category 2S (separate left renal lesion, stable since 12/13/2018), with plans to follow-up with LDCT in 1 year.  Or a subsequent renal ultrasound to further characterize this renal lesion, but upon discussion with Ms. Coalson, given that she had an ultrasound last year and the stability of the lesion, she wished to defer any additional imaging at this time.  - A1AT testing ordered in clinic.  This was ordered at her initial visit with me, but appears to have been lost.  We performed another alpha-1 antitrypsin genetic test today.  - Due for Pneumovax (23 valent) in July 2022 (last received July 2017), s/p Prevnar   - Changed previously from Spiriva HandiHaler to Respimat as she likely has minimal benefit from the HandiHaler due to the degree of difficulty with using it  - Nocturnal O2 study.  She has signs and symptoms of sleep apnea and would likely most benefit from a true sleep study, but she has a busy schedule with training dogs and has difficulty coming to appointments. Additionally, she is not particularly interested in noninvasive mechanical ventilation therapy at night. Will order Nocturnal oximetry today.   -Vitamin D testing normal  -I have given her a 5-day course of prednisone 40 mg to be used as needed; she will let us know if she needs to use this  -We will give her an nebulizer and albuterol treatments to see if these are more efficacious than the inhaler.  -Attempting to cut back smoking, using e-cigarettes. Down to 0.5 PPD. Her new roommate is attempting to quit, also. Not interested in referral to smoking cessation clinic at this time.  -Influenza vaccine completed for 2021-2022  -COVID Moderna 02/2019, 03/2019. Will give Pfizer booster today    LV systolic dysfunction with CAD: Being managed by cardiology. Plans for Repatha, repeat TTE, and GDMT.    There are no diagnoses linked to this encounter.    Plan of care was discussed with the patient who acknowledged understanding and is in agreement.    Patient will return to clinic in 3-4 months or sooner if needed.    This patient was seen and discussed with attending physician, Dr. Leticia Clas, who agrees with the assessment and plan above.     AV:WUJWJ Glynis Smiles, Hughie Closs, MD    Eustaquio Boyden, MD  Fellow, Pulmonary/Critical Care Medicine  Pager: [phone]      HISTORY:     Original History of Present Illness from 12/24/19:  Ms. Scrivens is a 70 y.o. female with a history of COPD, active tobacco use disorder (80 pack year smoking history), HLD, HTN, prior alcohol use disorder (quit in 208, with withdrawal seizures), Peripheral vascular disease with aortobifemoral bypass in 2006, and CAD with MI whom we are seeing in consultation requested by Clydene Fake for evaluation of COPD    Initially went to the hospital in January/February of 2018 due to alcohol withdrawal seizures. Was given a diagnosis of COPD without PFTs. Was put on Spiriva and Advair at that time. Stopped Advair due to concerns weight gain. mMRC is 2 points; can walk around 20 yards due to pain. Coughs daily, is productive in nature. Prior hx of MI and follows with cardiology.     -Has tried nicotine patches and lozenges in the past. Not interested in Chantix due to nightmares.   -Started smoking at [age]. 3 ppd for 15 years, 1 ppd for 35 years. 80 pack year smoking history.   -Lives at home with her 2 dogs. Has lives in [place], [place], [place]. Never lived overseas.   -Now retired. Was previously a mental health therapist.   -Was also a painter in the past.   -Uses Albuterol inhaler PRN,   -Uses Spiriva handihaler, but can't really tell whether it helps.   -Last course of abx/steroids was in December of 2020  - Moderna vaccinated, last dose February 2021.   -Needs 23-valent PNA vaccine in July 2022  -Has never been told she has sleep apnea.     No family hx of liver issues. Sister passed away recently due to asthma/likely COPD. No family hx of lung cancer.     Interval History 03/24/20    Continues to have chronic cough, relatively stable. Sometimes worsen when she uses her Respimat. Hasn't used her albuterol inhaler at all. Had an ear infection over Christmas that she was given abx for, but no COPD exacerbations. Has an ENT appointment in 2 weeks. Cutting back her cigarettes to 0.5 PPD. Using e-cigarettes as a replacement.  Now living with a new roommate who also wants to quit smoking    Past Medical History:  Past Medical History:   Diagnosis Date   ??? Alcoholism (CMS-HCC)    ??? COPD (chronic obstructive pulmonary disease) (CMS-HCC)    ??? GERD (gastroesophageal reflux disease)     Take OTC meds for this   ??? Hyperlipidemia Years ago    cannot take statins   ??? Hypertension    ??? Status epilepticus (CMS-HCC)    ??? Stroke (CMS-HCC) 2013    TIA     Past Surgical History:   Procedure Laterality Date   ??? by pass surgery  2006    Aotic Femoral bypass [name])    ??? FRACTURE SURGERY      Left wrist-metal in place   ??? PR COLONOSCOPY W/BIOPSY SINGLE/MULTIPLE N/A 05/20/2019    Procedure: COLONOSCOPY, FLEXIBLE, PROXIMAL TO SPLENIC FLEXURE; WITH BIOPSY, SINGLE OR MULTIPLE;  Surgeon: Leland Her, MD;  [facility];  Service: Gastroenterology   ??? PR COLSC FLX W/RMVL OF TUMOR POLYP LESION SNARE TQ N/A 05/20/2019    Procedure: COLONOSCOPY FLEX; W/REMOV TUMOR/LES BY SNARE;  Surgeon: Leland Her, MD;  Location: [facility];  Service: Gastroenterology       Other History:  The social history and family history were personally reviewed and updated in the patient's electronic medical record.    Family History   Problem Relation Age  of Onset   ??? Heart disease Mother    ??? Neuropathy Mother    ??? Alcohol abuse Mother    ??? Stroke Mother    ??? Glaucoma Mother    ??? Heart disease Father    ??? Hypertension Father    ??? Alcohol abuse Father    ??? Early death Father         Died at age 18, heart attack   ??? Heart disease Sister    ??? Asthma Brother    ??? COPD Brother    ??? Alcohol abuse Sister    ??? Alcohol abuse Brother    ??? Alcohol abuse Maternal Grandmother    ??? Stroke Maternal Grandmother    ??? Asthma Sister    ??? COPD Sister    ??? Diabetes Sister    ??? Hyperlipidemia Sister    ??? Hypertension Sister    ??? Hypertension Brother      Social History     Socioeconomic History   ??? Marital status: Single     Spouse name: Not on file   ??? Number of children: Not on file   ??? Years of education: Not on file   ??? Highest education level: Not on file   Occupational History   ??? Not on file   Tobacco Use   ??? Smoking status: Current Every Day Smoker     Packs/day: 1.00     Years: 40.00     Pack years: 40.00     Types: Cigarettes     Start date: 07/19/1972   ??? Smokeless tobacco: Never Used   ??? Tobacco comment: pack a day   Vaping Use   ??? Vaping Use: Never used   Substance and Sexual Activity   ??? Alcohol use: Not Currently     Alcohol/week: 0.0 standard drinks     Comment: sober for 2 yrs    ??? Drug use: No   ??? Sexual activity: Not Currently   Other Topics Concern   ??? Exercise No   ??? Living Situation Yes   Social History Narrative   ??? Not on file     Social Determinants of Health     Financial Resource Strain: Not on file   Food Insecurity: Not on file   Transportation Needs: Not on file   Physical Activity: Not on file   Stress: Not on file   Social Connections: Not on file       Home Medications:  Current Outpatient Medications on File Prior to Visit   Medication Sig Dispense Refill   ??? albuterol 2.5 mg /3 mL (0.083 %) nebulizer solution      ??? alendronate (FOSAMAX) 70 MG tablet  (Patient not taking: Reported on 02/02/2020)     ??? amLODIPine (NORVASC) 5 MG tablet Take 1 tablet (5 mg total) by mouth daily. 90 tablet 1   ??? aspirin 81 MG chewable tablet Chew 1 tablet (81 mg total) daily. 30 tablet 0   ??? carvediloL (COREG) 3.125 MG tablet Take 1 tablet (3.125 mg total) by mouth 2 (two) times a day with meals. 180 tablet 3   ??? cyclobenzaprine (FLEXERIL) 10 MG tablet 10 mg Three (3) times a day as needed for muscle spasms.  (Patient not taking: Reported on 02/02/2020)     ??? empty container (SHARPS-A-GATOR DISPOSAL SYSTEM) Misc Use as directed for sharps disposal 1 each 2   ??? evolocumab (REPATHA SURECLICK) 140 mg/mL PnIj Inject the contents of 1 pen (140 mg) under  the skin every fourteen (14) days. 6 mL 3   ??? lisinopriL (PRINIVIL,ZESTRIL) 10 MG tablet Take 1 tablet by mouth daily.     ??? naproxen (NAPROSYN) 500 MG tablet Take 500 mg by mouth two (2) times a day as needed.  (Patient not taking: Reported on 02/02/2020)     ??? omeprazole (PRILOSEC) 20 MG capsule Take 1 capsule by mouth daily.     ??? predniSONE (DELTASONE) 20 MG tablet Take 2 tablets (40 mg total) by mouth daily. As directed by your pulmonologist. (Patient not taking: Reported on 02/02/2020) 10 tablet 0   ??? tiotropium bromide (SPIRIVA RESPIMAT) 2.5 mcg/actuation inhalation mist Inhale 2 puffs daily. 4 g 3   ??? traMADoL (ULTRAM) 50 mg tablet Take 2 tablets (100 mg total) by mouth every six (6) hours as needed for pain. (Patient not taking: Reported on 02/02/2020) 40 tablet 0   ??? [DISCONTINUED] lisinopril-hydrochlorothiazide (PRINZIDE,ZESTORETIC) 20-25 mg per tablet Take 1 tablet by mouth daily. 30 tablet 0     No current facility-administered medications on file prior to visit.       Allergies:  Allergies as of 03/24/2020 - Reviewed 03/24/2020   Allergen Reaction Noted   ??? Atorvastatin Other (See Comments) 04/24/2016   ??? Carvedilol Diarrhea 11/13/2018   ??? Metoprolol Diarrhea 11/13/2018       Review of Systems:  A comprehensive review of systems was completed and negative except as noted in HPI.    PHYSICAL EXAM:   Wt 70.8 kg (156 lb)  - BMI 25.96 kg/m??   GEN: NAD, sitting up in bed  EYES: EOMI, sclera anicteric  ENT: Trachea midline, MMM  CV: RRR, no murmurs appreciated  PULM: Normal work of breathing, no wheezing with forced expiration, no stridor  ABD: soft, non-tender, non-distended  EXT: No edema  NEURO: Grossly Non-focal, moving all extremities normally  PSYCH: A+Ox3, appropriate  GU: No CVA tenderness  MSK: No spinal tenderness, no obvious joint deformities of b/l hands       LABORATORY and RADIOLOGY DATA:     Pulmonary Function Tests/Interpretation:    Date FEV1  (Pre/Post) FVC  (Pre/Post) FEV1/FVC  (Pre/Post) DLCO   03/24/2020  1.42 (58.0%) / 1.44 (58.9%) 2.83 (89.3%) / 2.81 (88.9%) 50% /51% 13.32 (63.1%)       12/24/18: Moderate obstructive lung disease.  No improvement with bronchodilator.  DLCO is moderately reduced.  Normal expiratory limb of flow volume loop.          Pertinent Imaging Data:    No results found.

## 2020-03-24 NOTE — Unmapped (Signed)
Administered pfizer booster in L deltoid. Pt tolerated inj well. Had pt wait 15 min.

## 2020-03-24 NOTE — Unmapped (Signed)
Great to see you again today!    Please let us know how we can help you with quitting smoking

## 2020-03-30 ENCOUNTER — Telehealth
Admit: 2020-03-30 | Discharge: 2020-03-31 | Payer: MEDICARE | Attending: Physical Medicine & Rehabilitation | Primary: Physical Medicine & Rehabilitation

## 2020-03-30 DIAGNOSIS — M5442 Lumbago with sciatica, left side: Principal | ICD-10-CM

## 2020-03-30 DIAGNOSIS — M5441 Lumbago with sciatica, right side: Principal | ICD-10-CM

## 2020-03-30 DIAGNOSIS — M7138 Other bursal cyst, other site: Principal | ICD-10-CM

## 2020-03-30 DIAGNOSIS — G8929 Other chronic pain: Principal | ICD-10-CM

## 2020-03-30 DIAGNOSIS — M47816 Spondylosis without myelopathy or radiculopathy, lumbar region: Principal | ICD-10-CM

## 2020-03-30 NOTE — Unmapped (Addendum)
Dear Ms. Roulhac,    Thank you for coming to see [place] today. It was a pleasure to see you. This summary reviews the goals and plans we discussed at your visit today.     Below, you will see: a) your working diagnosis b) your treatment plan and c) your next steps and followup plan.    We care about your quality of life and are committed to helping optimize your functionality.     DIAGNOSIS:   1. Chronic bilateral low back pain with bilateral sciatica    2. Lumbar spondylosis    3. Synovial cyst of lumbar facet joint       TREATMENT PLAN:   Schedule bilateral L4-5, L5-S1 Facet targeted RFA.    You may contact me through MyChart of call our office with any questions.     If you develop any red flag signs such as new or progressive motor weakness, sensory deficits, saddle anesthesia (groin numbness), bowel/bladder dysfunction, gait/coordination disturbance, weight loss, or night pain, you should call our office and/or seek prompt evaluation at the nearest ER.    All my best,    Asa Saunas, MD  Assistant Professor - PM&R  Interventional Spine Specialist - [facility]  Staff Physiatrist [facility]   [place] [facility] [place] [employer]

## 2020-03-30 NOTE — Unmapped (Signed)
Last procedure (ESI) helped a lot, especially with left hip pain. Helped with mobility. Still some pain, but not the constant pain. Still providing relief. Still having tiredness in legs when trying to walk.     Still having intense lower back pain that is worse at the end of the day. Somewhat better right after last ESI. Had been getting relief with facet injections for 4 months at a time, but switched to [facility] given presence of radicular pain. Now no radicular pain, just fatigue and ache in the legs, but still gets ache in lower back. This lower back pain is worse, asking about moving forward with RFA. We discussed indications and expectations, and agreed to proceed with bilateral L4-5, L5-S1 Facet targeted RFA. She has tolerated previous procedures well.       Patient Virtual Encounter    Exam noted below reflects most recent in-person visit if available and is included as a reference regarding patient's functional status.  Any additional exam that could be conducted with audio/visual communication was added as necessary.     Location of provider during encounter: Home          The patient reports they are currently: not at home. I spent 12 minutes on the real-time audio and video with the patient on the date of service. I spent an additional 4 minutes on pre- and post-visit activities on the date of service.     The patient was physically located in [place] or a state in which I am permitted to provide care. The patient and/or parent/guardian understood that s/he may incur co-pays and cost sharing, and agreed to the telemedicine visit. The visit was reasonable and appropriate under the circumstances given the patient's presentation at the time.    The patient and/or parent/guardian has been advised of the potential risks and limitations of this mode of treatment (including, but not limited to, the absence of in-person examination) and has agreed to be treated using telemedicine. The patient's/patient's family's questions regarding telemedicine have been answered.     If the visit was completed in an ambulatory setting, the patient and/or parent/guardian has also been advised to contact their provider???s office for worsening conditions, and seek emergency medical treatment and/or call 911 if the patient deems either necessary.

## 2020-04-05 ENCOUNTER — Encounter: Admit: 2020-04-05 | Discharge: 2020-04-06 | Payer: MEDICARE

## 2020-04-05 ENCOUNTER — Encounter: Admit: 2020-04-05 | Discharge: 2020-04-06 | Payer: MEDICARE | Attending: Audiologist | Primary: Audiologist

## 2020-04-05 DIAGNOSIS — G521 Disorders of glossopharyngeal nerve: Principal | ICD-10-CM

## 2020-04-05 DIAGNOSIS — H9201 Otalgia, right ear: Principal | ICD-10-CM

## 2020-04-05 DIAGNOSIS — H918X9 Other specified hearing loss, unspecified ear: Principal | ICD-10-CM

## 2020-04-05 DIAGNOSIS — Z87891 Personal history of nicotine dependence: Principal | ICD-10-CM

## 2020-04-05 DIAGNOSIS — H903 Sensorineural hearing loss, bilateral: Principal | ICD-10-CM

## 2020-04-05 DIAGNOSIS — Z01118 Encounter for examination of ears and hearing with other abnormal findings: Principal | ICD-10-CM

## 2020-04-05 DIAGNOSIS — H9313 Tinnitus, bilateral: Principal | ICD-10-CM

## 2020-04-05 MED ORDER — GABAPENTIN 100 MG CAPSULE
ORAL_CAPSULE | Freq: Three times a day (TID) | ORAL | 3 refills | 90 days | Status: CP
Start: 2020-04-05 — End: 2021-04-05

## 2020-04-05 NOTE — Unmapped (Signed)
AUDIOLOGIC EVALUATION    [PAIN 7/10 - right ear]. Tanya Gordon presents to [facility] for an audiologic evaluation. Patient is being seen in conjunction with Dr. Joretta Bachelor.    HISTORY: Tanya Gordon is a 70 y.o. female with a history of right otalgia which radiates down to her throat since Dec 2021. Patient reports trying antibiotics, steroids, and every OTC drop available without improvement in right ear status. Constant bilateral tinnitus which began 10+ years ago and is non-bothersome today but can get louder and quite bothersome. No vertigo or noise exposure.     RESULTS: Today's results were obtained using insert headphones with good reliability.    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  RIGHT ear: Consistent with normal middle ear function  LEFT ear: Consistent with normal middle ear function    *SEE AUDIOGRAM IMAGE IN MEDIA TAB*    RECOMMENDATIONS:  ??? ENT - patient is seeing Dr. Joretta Bachelor today  ??? Re-evaluate per MD or sooner with any changes or concerns regarding hearing status  ??? Consider trial with amplification pending medical clearance and patient motivation      I wore appropriate PPE throughout entire appointment (face mask and eye protection). Patient also wore face mask appropriately for the entire appointment.    Procedure(s):  1) CPT 92557 - Comprehensive Audio Eval & Speech Recognition  2) CPT E974542 - Tympanometry    Visit Time: 30 min    Alm Bustard, AuD  Clinical Audiologist

## 2020-04-26 NOTE — Unmapped (Signed)
Tanya Gordon reports her Repatha injections have been going well - she'll plan for a cardiology visit in a few weeks.     [facility] Clinical Assessment & Refill Coordination Note    Tanya Gordon, DOB: [birth year]  Phone: [phone] (home)     All above HIPAA information was verified with patient.     Was a Nurse, learning disability used for this call? No    Specialty Medication(s):   General Specialty: Repatha     Current Outpatient Medications   Medication Sig Dispense Refill   ??? albuterol 2.5 mg /3 mL (0.083 %) nebulizer solution      ??? alendronate (FOSAMAX) 70 MG tablet      ??? amLODIPine (NORVASC) 5 MG tablet Take 1 tablet (5 mg total) by mouth daily. 90 tablet 1   ??? aspirin 81 MG chewable tablet Chew 1 tablet (81 mg total) daily. 30 tablet 0   ??? carvediloL (COREG) 3.125 MG tablet Take 1 tablet (3.125 mg total) by mouth 2 (two) times a day with meals. 180 tablet 3   ??? cyclobenzaprine (FLEXERIL) 10 MG tablet 10 mg Three (3) times a day as needed for muscle spasms.      ??? empty container (SHARPS-A-GATOR DISPOSAL SYSTEM) Misc Use as directed for sharps disposal 1 each 2   ??? evolocumab (REPATHA SURECLICK) 140 mg/mL PnIj Inject the contents of 1 pen (140 mg) under the skin every fourteen (14) days. 6 mL 3   ??? gabapentin (NEURONTIN) 100 MG capsule Take 1 capsule (100 mg total) by mouth Three (3) times a day. 270 capsule 3   ??? lisinopriL (PRINIVIL,ZESTRIL) 10 MG tablet Take 20 mg by mouth daily.      ??? naproxen (NAPROSYN) 500 MG tablet Take 500 mg by mouth two (2) times a day as needed.      ??? omeprazole (PRILOSEC) 20 MG capsule Take 1 capsule by mouth daily.     ??? predniSONE (DELTASONE) 20 MG tablet Take 2 tablets (40 mg total) by mouth daily. As directed by your pulmonologist. (Patient taking differently: Take 40 mg by mouth once as needed. As directed by your pulmonologist.) 10 tablet 0   ??? tiotropium bromide (SPIRIVA RESPIMAT) 2.5 mcg/actuation inhalation mist Inhale 2 puffs daily. 4 g 3   ??? traMADoL (ULTRAM) 50 mg tablet Take 2 tablets (100 mg total) by mouth every six (6) hours as needed for pain. (Patient taking differently: Take 100 mg by mouth once as needed for pain. ) 40 tablet 0     No current facility-administered medications for this visit.        Changes to medications: Tanya Gordon reports no changes at this time.    Allergies   Allergen Reactions   ??? Atorvastatin Other (See Comments)     Muscle pain    ??? Carvedilol Diarrhea   ??? Metoprolol Diarrhea       Changes to allergies: No    SPECIALTY MEDICATION ADHERENCE     Repatha - 0 left    Medication Adherence    Patient reported X missed doses in the last month: 0  Specialty Medication: Repatha 140 mg/mL          Specialty medication(s) dose(s) confirmed: Regimen is correct and unchanged.     Are there any concerns with adherence? No    Adherence counseling provided? Not needed    CLINICAL MANAGEMENT AND INTERVENTION      Clinical Benefit Assessment:    Do you feel the  medicine is effective or helping your condition? unknown - no repeat labs yet for cholesterol, plan for this at follow up in ~ 2 weeks    Clinical Benefit counseling provided? Not needed    Adverse Effects Assessment:    Are you experiencing any side effects? No    Are you experiencing difficulty administering your medicine? No    Quality of Life Assessment:    How many days over the past month did your hyperlipidemia  keep you from your normal activities? For example, brushing your teeth or getting up in the morning. 0    Have you discussed this with your provider? Not needed    Therapy Appropriateness:    Is therapy appropriate? Yes, therapy is appropriate and should be continued    DISEASE/MEDICATION-SPECIFIC INFORMATION      For patients on injectable medications: Patient currently has 0 doses left.  Next injection is scheduled for Wed, 3/2.    PATIENT SPECIFIC NEEDS     - Does the patient have any physical, cognitive, or cultural barriers? No    - Is the patient high risk? No    - Does the patient require a Care Management Plan? No     - Does the patient require physician intervention or other additional services (i.e. nutrition, smoking cessation, social work)? No      SHIPPING     Specialty Medication(s) to be Shipped:   General Specialty: Repatha    Other medication(s) to be shipped: No additional medications requested for fill at this time     Changes to insurance: No    Delivery Scheduled: Yes, Expected medication delivery date: Wed, 3/2.     Medication will be delivered via UPS to the confirmed prescription address in [facility].    The patient will receive a drug information handout for each medication shipped and additional FDA Medication Guides as required.  Verified that patient has previously received a Conservation officer, historic buildings.    All of the patient's questions and concerns have been addressed.    Tanya Gordon   [facility] Specialty Pharmacist

## 2020-04-27 MED FILL — REPATHA SURECLICK 140 MG/ML SUBCUTANEOUS PEN INJECTOR: SUBCUTANEOUS | 84 days supply | Qty: 6 | Fill #1

## 2020-05-04 ENCOUNTER — Encounter: Admit: 2020-05-04 | Discharge: 2020-05-05 | Payer: MEDICARE

## 2020-05-04 MED ADMIN — gadobenate dimeglumine (MULTIHANCE) 529 mg/mL (0.1mmol/0.2mL) solution 15 mL: 15 mL | INTRAVENOUS | @ 21:00:00 | Stop: 2020-05-04

## 2020-05-05 NOTE — Unmapped (Signed)
Spoke with pt regarding MRI results and that a referral to Dr. Harvie Heck in our department has been made. She is agreeable and also recommended yearly f/u for her hearing.

## 2020-05-10 ENCOUNTER — Ambulatory Visit
Admit: 2020-05-10 | Discharge: 2020-05-11 | Payer: MEDICARE | Attending: Student in an Organized Health Care Education/Training Program | Primary: Student in an Organized Health Care Education/Training Program

## 2020-05-10 ENCOUNTER — Encounter: Admit: 2020-05-10 | Discharge: 2020-05-11 | Payer: MEDICARE

## 2020-05-10 DIAGNOSIS — I519 Heart disease, unspecified: Principal | ICD-10-CM

## 2020-05-10 DIAGNOSIS — R0600 Dyspnea, unspecified: Principal | ICD-10-CM

## 2020-05-10 DIAGNOSIS — I10 Essential (primary) hypertension: Principal | ICD-10-CM

## 2020-05-10 MED ORDER — CARVEDILOL 6.25 MG TABLET
ORAL_TABLET | Freq: Two times a day (BID) | ORAL | 1 refills | 90 days | Status: CP
Start: 2020-05-10 — End: ?

## 2020-05-10 MED ORDER — LISINOPRIL 20 MG TABLET
ORAL_TABLET | Freq: Every day | ORAL | 3 refills | 90.00000 days | Status: CP
Start: 2020-05-10 — End: ?

## 2020-05-10 NOTE — Unmapped (Signed)
Please increase your Coreg to TWO tablets twice daily (this will help better control your blood pressure). This dose will be 6.25 mg twice daily. I will send this higher dose to your pharmacy for when you run out of the current, smaller dose you have at home (your current tablets are 3.125 mg each).    We will plan to check your cholesterol again in 6 months at the time of your visit.

## 2020-05-10 NOTE — Unmapped (Signed)
Cardiology Consultation Note  ASSESSMENT / PLAN    CAD with ischemic disease: Extensive diseased noted on CT scan from 01/12/2020. PET stress in 04/2018 notable for moderate sized fixed defect of the inferior segments ost consistent with scar with a small area of peri-infarct ischemia noted. Patient has significant smoking history, about 80 pack years, and is currently following with Pulmonology for management of COPD. She is now down to half a pack per day (using E-cigarettes to wean herself). Currently she is not on a statin given severe muscle pain with these medications (patient tried multiple statins 2-3 years ago, all of which were discontinued). She was then trialed on ezetimibe and niacin, though these medications have not been shown to improve mortality in those with severe CAD. Given the severity of her coronary calcifications, evidence of missed MI on PET stress in 04/2018, and LV systolic dysfunction on echo, she requires evidence based management of her dyslipidemia in order to mitigate the risk of further ischemic events. Given this, Repatha started 01/2020. No changes today.  - Continue ASA 81 mg daily  - Repatha q2 weeks    LV systolic dysfunction: EF mildly decreased 45-50% per TTE in 2019, now normalized per 04/2020 TTE. Patient denies symptoms of volume overload or hospitalizations for heart failure. Given extensive CAD with probable ischemic disease as noted above, etiology of systolic dysfunction could be ischemic. She is currently on ACEi and beta blocker for GDMT.   - Continue lisinopril 20 mg daily (recently increased per PCP)  - Increase to carvedilol 6.25 mg bid     HTN: Managed by patient's PCP. She is currently on amlodipine and lisinopril, BP 133/79 today. Patient's PCP recently increased her lisinopril to 20 mg daily from 10 mg daily. Will continue this dose and increase her Coreg as well (as above).   - Lisinopril 20 mg daily  - Increase Coreg as above  - Continue amlodipine 5 mg daily    RTC in 6 months with lipid panel to be ordered in clinic    Cheryll Dessert, MD, MPH  Fellow, Cardiovascular Disease  [facility]    ______________________________________________________________________    PCP: Hughie Closs, MD  Patient: Tanya Gordon  DOB: 1950/05/23    SUBJECTIVE    HPI: Tanya Gordon is a 70 y.o. female with a history of CAD (extensive coronary calcifications and likely missed MI),   COPD, active tobacco use disorder (80 pack year smoking history), HLD, HTN, prior alcohol use disorder (quit in 2008, hx of withdrawal seizures), peripheral vascular disease (s/p aortobifemoral bypass 2006) and self-reported iliac stents who presents today to establish care.     Patient has a known history of CAD, and was reportedly following with an outside cardiologist until this person recently left the practice. LRCT 12/2019 shows extensive coronary calcifications, and PET stress from 04/2018 demonstrates inferior scar, likely due to missed MI. TTE from 03/2017 shows mild reduction in LV systolic function. Patient states that she is aware of her CAD and missed MI, and reports a strong family history of CAD and MIs (sister had MI at age 45, father had heart disease). She states that she takes a baby aspirin daily, but that she was unable to tolerate statins due to severe muscle pain. She was on niacin and ezetimibe at one point, but is no longer taking these. Also states that she has tried both metoprolol and carvedilol, but says that these medications seemed to cause GI upset, though  she states that she was on several new medications at that time, and does not know if she can fully attribute her GI upset to the beta blockers.     Interval History    Patient was last seen in clinic by me on 02/02/2020. Since that time, patient states that she has done well, and has no complaints today. She states that she has been taking the Repatha since 01/2020, and has had no issues with this medication. She denies chest pain, SOB, fatigue, lightheadedness, dizziness, palpitations, swelling, and syncope.    ______________________________________________________________________  Cardiovascular History & Procedures:    Cath / PCI:  None    CV Surgery:  None    EP Procedures and Devices:  None    Non-Invasive Evaluation(s):  TTE, 05/10/2020:  Normal EF, formal read pending    TTE, 04/06/2017:  SUMMARY:  ?? Technically difficult study due to chest wall/lung interference  ?? Ultrasound enhancing agent utilized to improve endocardial border definition  ?? Mildly decreased left ventricular systolic function, ejection fraction 45 to 50%  ?? Segmental wall motion abnormality - (basal inferior and basal inferoseptal)  ?? Dilated left atrium - mild  ?? Mitral annular calcification  ?? Mitral regurgitation - mild  ?? Normal right ventricular systolic function  ?? Elevated right atrial pressure    PET Stress, 05/07/2018:  Impressions:  - Abnormal myocardial perfusion study  - There is a moderate in size, severe, fixed defect involving the basal inferior, basal inferolateral and basal inferoseptal segments. This is consistent with probable scar.  - There is a small in size, mild in severity, partially reversible defect involving the mid inferior segment. This is consistent with probable peri-infarct ischemia.  - During stress: Global systolic function is mildly reduced. The ejection fraction calculated at 40%.   - Coronary calcifications are noted  - Mitral annular calcification is noted  - 1.5 cm exophytic renal soft tissue density in the posterior aspect of the left kidney, recommend renal ultrasound if clinically concerned  ______________________________________________________________________    ______________________________________________________________________   MEDICATIONS:  Reviewed in Epic:  Current Outpatient Medications   Medication Sig Dispense Refill   ??? albuterol 2.5 mg /3 mL (0.083 %) nebulizer solution ??? alendronate (FOSAMAX) 70 MG tablet      ??? amLODIPine (NORVASC) 5 MG tablet Take 1 tablet (5 mg total) by mouth daily. 90 tablet 1   ??? aspirin 81 MG chewable tablet Chew 1 tablet (81 mg total) daily. 30 tablet 0   ??? carvediloL (COREG) 3.125 MG tablet Take 1 tablet (3.125 mg total) by mouth 2 (two) times a day with meals. 180 tablet 3   ??? cyclobenzaprine (FLEXERIL) 10 MG tablet 10 mg Three (3) times a day as needed for muscle spasms.      ??? empty container (SHARPS-A-GATOR DISPOSAL SYSTEM) Misc Use as directed for sharps disposal 1 each 2   ??? evolocumab (REPATHA SURECLICK) 140 mg/mL PnIj Inject the contents of 1 pen (140 mg) under the skin every fourteen (14) days. 6 mL 3   ??? gabapentin (NEURONTIN) 100 MG capsule Take 1 capsule (100 mg total) by mouth Three (3) times a day. 270 capsule 3   ??? lisinopriL (PRINIVIL,ZESTRIL) 10 MG tablet Take 20 mg by mouth daily.      ??? naproxen (NAPROSYN) 500 MG tablet Take 500 mg by mouth two (2) times a day as needed.      ??? omeprazole (PRILOSEC) 20 MG capsule Take 1 capsule by mouth daily.     ???  predniSONE (DELTASONE) 20 MG tablet Take 2 tablets (40 mg total) by mouth daily. As directed by your pulmonologist. (Patient taking differently: Take 40 mg by mouth once as needed. As directed by your pulmonologist.) 10 tablet 0   ??? tiotropium bromide (SPIRIVA RESPIMAT) 2.5 mcg/actuation inhalation mist Inhale 2 puffs daily. 4 g 3   ??? traMADoL (ULTRAM) 50 mg tablet Take 2 tablets (100 mg total) by mouth every six (6) hours as needed for pain. (Patient taking differently: Take 100 mg by mouth once as needed for pain. ) 40 tablet 0     No current facility-administered medications for this visit.       ______________________________________________________________________   ALLERGIES:  Reviewed in Epic:  Allergies   Allergen Reactions   ??? Atorvastatin Other (See Comments)     Muscle pain    ??? Carvedilol Diarrhea   ??? Metoprolol Diarrhea ______________________________________________________________________    ______________________________________________________________________  FAMILY HISTORY:    Family History   Problem Relation Age of Onset   ??? Heart disease Mother    ??? Neuropathy Mother    ??? Alcohol abuse Mother    ??? Stroke Mother    ??? Glaucoma Mother    ??? Heart disease Father    ??? Hypertension Father    ??? Alcohol abuse Father    ??? Early death Father         Died at age 77, heart attack   ??? Heart disease Sister    ??? Asthma Brother    ??? COPD Brother    ??? Alcohol abuse Sister    ??? Alcohol abuse Brother    ??? Alcohol abuse Maternal Grandmother    ??? Stroke Maternal Grandmother    ??? Asthma Sister    ??? COPD Sister    ??? Diabetes Sister    ??? Hyperlipidemia Sister    ??? Hypertension Sister    ??? Hypertension Brother      ______________________________________________________________________  ROS:  All other systems reviewed and negative except for what is listed in the HPI  ______________________________________________________________________  OBJECTIVE  BP 133/79 (BP Site: R Arm, BP Position: Sitting)  - Pulse 84  - Resp 18  - Ht 167.6 cm (5' 6)  - Wt 75.5 kg (166 lb 6.4 oz)  - SpO2 98%  - BMI 26.86 kg/m??     PHYSICAL EXAMINATION:   GENERAL:  Alert, NAD  EYES: Sclerae clear, EOMI b/l  NECK: Supple, trachea midline  CARDIOVASCULAR:  Normal rate and regular rhythm, normal S1/S2, no murmurs, rubs, or gallops. No peripheral edema.  RESPIRATORY:  Reduced breath sounds in the upper lung fields, clear to auscultation bilaterally.  No wheezes, crackles, or rhonchi. Normal work of breathing.  ABDOMEN/GI:  Soft, non-tender, non-distended with normoactive bowel sounds.  MSK: No effusions  NEUROLOGIC: Motor exam grossly non-focal. Alert and appropriately conversational  SKIN: No visible rashes  PSYCH:  Normal mental status, mood, and affect.  ______________________________________________________________________  EKG  02/02/2020  NSR, Inferior Q waves    OTHER PERTINENT LABS/STUDIES:  Lab Results   Component Value Date    LDL Calculated 143 (H) 02/02/2020    LDL Calculated 149 (H) 06/20/2016    Non-HDL Cholesterol 176 (H) 02/02/2020    HDL 65 (H) 02/02/2020    HDL 69 06/20/2016    INR 0.91 04/03/2017    Creatinine 0.70 05/20/2019    Creatinine Whole Blood, POC 0.9 12/13/2018    Potassium 3.8 05/20/2019    Potassium 4.5 10/16/2011    BUN  10 05/20/2019    BUN 11 10/16/2011

## 2020-05-13 MED ORDER — TIOTROPIUM BROMIDE 2.5 MCG/ACTUATION MIST FOR INHALATION
Freq: Every day | RESPIRATORY_TRACT | 3 refills | 0.00000 days | Status: CP
Start: 2020-05-13 — End: ?

## 2020-05-13 NOTE — Unmapped (Signed)
Received a fax from [facility] pharmacy for refill of Spiriva Respimat. Refill sent into to patients local pharmacy.

## 2020-05-22 NOTE — Unmapped (Signed)
 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     History of Present Illness:   The patient is a 70 y.o. 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     The patient was last seen in clinic on 04/05/2020 by Dr. Joretta Bachelor for evaluation of right otalgia and associated throat pain. Her symptoms began 4-5 months ago, and are exacerbated by talking, swallowing, and yawning. She denies headaches, vision changes, facial numbness, nasal congestion, or nasal drainage. She reports mild relief of pain from Tramadol, denies improvement with gabapentin.  She has previously been on antibiotics and steroids with minimal relief of symptoms. Audiogram in Dr. 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 Subsequent MRI brain 05/04/20 did not demonstrate IAC or CPA abnormalities, however did demonstrate small foci of enhancement within the left Meckel[place] cave concerning for schwannomas.     The patient denies fevers, chills, shortness of breath, chest pain, nausea, vomiting, diarrhea, inability to lie flat, dysphagia, odynophagia, hemoptysis, hematemesis, changes in vision, changes in voice quality, otalgia, otorrhea, vertiginous symptoms, focal deficits, or other concerning symptoms.    Past Medical History:  Past Medical History:   Diagnosis Date   ??? Alcoholism (CMS-HCC)    ??? COPD (chronic obstructive pulmonary disease) (CMS-HCC)    ??? GERD (gastroesophageal reflux disease)     Take OTC meds for this   ??? Hyperlipidemia Years ago    cannot take statins   ??? Hypertension    ??? Status epilepticus (CMS-HCC)    ??? Stroke (CMS-HCC) 2013    TIA       Past Surgical History:  Past Surgical History:   Procedure Laterality Date   ??? by pass surgery  2006    Aotic Femoral bypass [name])    ??? FRACTURE SURGERY      Left wrist-metal in place   ??? PR COLONOSCOPY W/BIOPSY SINGLE/MULTIPLE N/A 05/20/2019    Procedure: COLONOSCOPY, FLEXIBLE, PROXIMAL TO SPLENIC FLEXURE; WITH BIOPSY, SINGLE OR MULTIPLE;  Surgeon: Leland Her, MD;  Location: [facility];  Service: Gastroenterology   ??? PR COLSC FLX W/RMVL OF TUMOR POLYP LESION SNARE TQ N/A 05/20/2019    Procedure: COLONOSCOPY FLEX; W/REMOV TUMOR/LES BY SNARE;  Surgeon: Leland Her, MD;  Location: [facility];  Service: Gastroenterology       Current Medications:  Current Outpatient Medications   Medication Sig Dispense Refill   ??? albuterol 2.5 mg /3 mL (0.083 %) nebulizer solution      ??? alendronate (FOSAMAX) 70 MG tablet      ??? amLODIPine (NORVASC) 5 MG tablet Take 1 tablet (5 mg total) by mouth daily. 90 tablet 1   ??? aspirin 81 MG chewable tablet Chew 1 tablet (81 mg total) daily. 30 tablet 0   ??? carvediloL (COREG) 6.25 MG tablet Take 1 tablet (6.25 mg total) by mouth Two (2) times a day. 180 tablet 1   ??? cyclobenzaprine (FLEXERIL) 10 MG tablet 10 mg Three (3) times a day as needed for muscle spasms.      ??? empty container (SHARPS-A-GATOR DISPOSAL SYSTEM) Misc Use as directed for sharps disposal 1 each 2   ??? evolocumab (REPATHA SURECLICK) 140 mg/mL PnIj Inject the contents  of 1 pen (140 mg) under the skin every fourteen (14) days. 6 mL 3   ??? gabapentin (NEURONTIN) 100 MG capsule Take 1 capsule (100 mg total) by mouth Three (3) times a day. 270 capsule 3   ??? lisinopriL (PRINIVIL,ZESTRIL) 20 MG tablet Take 1 tablet (20 mg total) by mouth daily. 90 tablet 3   ??? naproxen (NAPROSYN) 500 MG tablet Take 500 mg by mouth two (2) times a day as needed.      ??? omeprazole (PRILOSEC) 20 MG capsule Take 1 capsule by mouth daily.     ??? predniSONE (DELTASONE) 20 MG tablet Take 2 tablets (40 mg total) by mouth daily. As directed by your pulmonologist. (Patient taking differently: Take 40 mg by mouth once as needed. As directed by your pulmonologist.) 10 tablet 0   ??? tiotropium bromide (SPIRIVA RESPIMAT) 2.5 mcg/actuation inhalation mist Inhale 2 puffs daily. 4 g 3   ??? traMADoL (ULTRAM) 50 mg tablet Take 2 tablets (100 mg total) by mouth every six (6) hours as needed for pain. (Patient taking differently: Take 100 mg by mouth once as needed for pain. ) 40 tablet 0     No current facility-administered medications for this visit.       Allergies:  Allergies   Allergen Reactions   ??? Atorvastatin Other (See Comments)     Muscle pain    ??? Carvedilol Diarrhea   ??? Metoprolol Diarrhea       Family History:  Family History   Problem Relation Age of Onset   ??? Heart disease Mother    ??? Neuropathy Mother    ??? Alcohol abuse Mother    ??? Stroke Mother    ??? Glaucoma Mother    ??? Heart disease Father    ??? Hypertension Father    ??? Alcohol abuse Father    ??? Early death Father         Died at age 75, heart attack   ??? Heart disease Sister    ??? Asthma Brother    ??? COPD Brother    ??? Alcohol abuse Sister    ??? Alcohol abuse Brother    ??? Alcohol abuse Maternal Grandmother    ??? Stroke Maternal Grandmother    ??? Asthma Sister    ??? COPD Sister    ??? Diabetes Sister    ??? Hyperlipidemia Sister    ??? Hypertension Sister    ??? Hypertension Brother        Social History:  Social History     Tobacco Use   Smoking Status Current Every Day Smoker   ??? Packs/day: 1.00   ??? Years: 40.00   ??? Pack years: 40.00   ??? Types: Cigarettes   ??? Start date: 07/19/1972   Smokeless Tobacco Never Used   Tobacco Comment    pack a day     Social History     Substance and Sexual Activity   Alcohol Use Not Currently   ??? Alcohol/week: 0.0 standard drinks    Comment: sober for 2 yrs      Social History     Substance and Sexual Activity   Drug Use No       Review of Systems:  A 12 system review of systems was performed and is negative other than that noted in the history of present illness.    Vital Signs:  Blood pressure 147/81, pulse 77, temperature 36.7 ??C (98.1 ??F), resp. rate 18, height 167.6 cm (5' 6), weight 75.1  kg (165 lb 8 oz).    Physical Exam:  General: Well-developed, well-nourished. Appropriate, comfortable, and in no apparent distress.  Head/Face: On external examination there is no obvious asymmetry or scars. On palpation there is no tenderness over maxillary sinuses or masses within the salivary glands. Cranial nerves V and VII are intact through all distributions.  Eyes: PERRL, EOMI, the conjunctiva are not injected and sclera is non-icteric.  Ears: On external exam, there is no obvious lesions or asymmetry. The EACs are bilaterally without cerumen or lesions. The TMs are in the neutral position and are mobile to pneumatic otoscopy bilaterally. There are no middle ear masses or fluid noted. Hearing is grossly intact bilaterally.  Nose: On external exam there are neither lesions nor asymmetry of the nasal tip/ dorsum. On anterior rhinoscopy, visualization posteriorly is limited on anterior examination. For this reason, to adequately evaluate posteriorly for masses, polypoid disease and/or signs of infections, nasal endoscopy is indicated (see procedure below).  Oral cavity/oropharynx: Good dentition. Tongue protrudes midline. Supraglottis not visualized due to gag reflex. Right soft-hard palate junction is with 1cm by 1cm submucosal firmness that is TTP. Sessile leukoplakic change along the area. Biopsy was taken of this area (see procedure below).   Neck: There is no asymmetry or masses. Trachea is midline. There is no enlargement of the thyroid or palpable thyroid nodules.   Lymphatics: There is no palpable lymphadenopathy along the jugulodiagastric, submental, or posterior cervical chains.  Neurologic: Cranial nerve???s II-XII are grossly intact. Exam is non-focal.  Extremities: No cyanosis, clubbing or edema.    Procedure:  Sinonasal Endoscopy (CPT G5073727): To better evaluate the patient???s symptoms, sinonasal endoscopy is indicated.  After discussion of risks and benefits, and topical decongestion and anesthesia, an endoscope was used to perform nasal endoscopy on each side. A time out identifying the patient, the procedure, the location of the procedure and any concerns was performed prior to beginning the procedure.    Findings:   Examination on the left reveals an intact nasal septum with no associated masses, lesions, or friable mucosa. The left middle meatus and sphenoethmoidal recesss are clear with no evidence of purulence, polyposis, or polypoid edema.    Examination on the right reveals an intact nasal septum with no associated masses, lesions, or friable mucosa. The right middle meatus and sphenoethmoidal recess are clear with no evidence of purulence, polyposis, or polypoid edema.    Continued posterior examination reveals an absent adenoidal pad. Bilateral patent Eustachian tube orifices. Healthy, symmetric nasopharyngeal and pharyngeal mucosa with no masses, lesions, or friable mucosa. Symmetric base of tongue with clear vallecula bilaterally. The piriform sinuses are clear bilaterally.     Inspection of the larynx reveals no evidence of supraglottic or glottic masses. There is bilateral true vocal cord motion.    Oral Cavity Biopsy (CPT [id number]): Due to the patient???s lesion, biopsy is indicated. A time out identifying the patient, the procedure, the location of the procedure and any concerns was performed prior to beginning the procedure.    Findings:  1 cm by 1 cm sessile leukoplakic change along the right soft-hard palate junction. TTP. The patient tolerated the procedure well and no significant bleeding was encountered.     Assessment/Recommendations:  The patient is a 70 y.o. 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     The patient's physical examination findings including endoscopy and diagnostic imaging were thoroughly  discussed.    Biopsies were taken in clinic today to evaluate her right palatal leukoplakic lesion. The patient tolerated the procedure well and no significant pain was encountered. I will order a CT sinus without and CT neck with contrast to better evaluate this lesion and the surrounding regions.     We discussed that the findings shown on the MRI are consistent schwannomas within the left Meckel's cave. We discussed that these tumors are benign and typically are very slow growing, thus at this point we would not recommend surgical intervention. We will continue to monitor this with repeat yearly MRI.     I will call her to discuss once the results of her biopsy are available.     The patient voiced complete understanding of the plan as detailed above and is in full agreement.    Scribe's Attestation: Milus Mallick, MD obtained and performed the history, physical exam and medical decision making elements that were entered into the chart. Signed by Frazier Butt, Scribe, on May 28, 2020 10:28 AM    ----------------------------------------------------------------------------------------------------------------------  June 11, 2020 12:47 AM. Documentation assistance provided by the Scribe. I was present during the time the encounter was recorded as detailed above. I personally performed all the noted procedures. The information recorded by the Scribe was done at my direction and has been reviewed and validated by me. ----------------------------------------------------------------------------------------------------------------------  ATTENDING ATTESTATION:  I evaluated the patient performing the history and physical examination. I personally performed the noted procedures. I discussed the findings, assessment and plan with the Resident and agree with the findings and plan as documented in the note.  Oris Drone Harvie Heck, MD

## 2020-05-28 ENCOUNTER — Encounter
Admit: 2020-05-28 | Discharge: 2020-05-29 | Payer: MEDICARE | Attending: Student in an Organized Health Care Education/Training Program | Primary: Student in an Organized Health Care Education/Training Program

## 2020-05-28 ENCOUNTER — Encounter: Admit: 2020-05-28 | Discharge: 2020-05-29 | Payer: MEDICARE

## 2020-05-28 DIAGNOSIS — K1379 Other lesions of oral mucosa: Principal | ICD-10-CM

## 2020-05-28 DIAGNOSIS — D3610 Benign neoplasm of peripheral nerves and autonomic nervous system, unspecified: Principal | ICD-10-CM

## 2020-05-28 MED ADMIN — iohexoL (OMNIPAQUE) 350 mg iodine/mL solution 75 mL: 75 mL | INTRAVENOUS | @ 19:00:00 | Stop: 2020-05-28

## 2020-05-28 NOTE — Unmapped (Signed)
Flexible endoscope serial number [id number] used today by Milus Mallick, MD

## 2020-05-28 NOTE — Unmapped (Signed)
Contact Information:  You saw Oris Drone. Harvie Heck, MD for your visit today.    For appointments call [phone] OPT 1    To contact a nurse call:  Pat Kocher RN, BSN  Voicemail: [phone]                     OR  Carmelina Peal, CMA  Voicemail: [phone]    To schedule surgery contact Christiana Fuchs:  Phone: [phone]    After hours, for URGENT concerns only, please call [phone] and ask for the ENT physician on call.    Main phone tree:  [phone]  Referral line:  [phone]  Triage M-F: 8:15-4:00pm [phone]  Office fax #:  [phone]  Thermon Leyland (Clinic Manager) [phone]

## 2020-06-04 NOTE — Unmapped (Signed)
Pre-procedure call. Denies recent infection/antibiotics. Continues to take aspirin 81 mg. Denies taking any recent NSAIDs.         06/04/20 1615   Pre-op Phone Call   Surgery Time Verified Yes   Arrival Time Verified Yes   Surgery Location Verified Yes   Ride and Caregiver Arranged Yes

## 2020-06-07 NOTE — Unmapped (Signed)
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  M47.816  POST-PROCEDURE DIAGNOSIS: Same  PERFORMED BY:  Dr. Eustaquio Maize  ASSISTANT:  Dr. Lamount Cranker  Anesthesia: Local    INTERIM HISTORY:     This is a 70yo female with low back pain and hip pain. Hip pain recently improved after ESI. Has had benefit from L4/5 and L5/S1 facet joint injections in the past. Referred from PM&R for bilateral lumbar medial branch RFA today.    PROCEDURE PLANNED:  Lumbar facet radiofrequency thermocoagulation neurotomies of the bilateral L3, L4, L5 medial branch nerves was decided to be performed. Informed consent was obtained and potential risks discussed including, but not limited to: bleeding, bruising, severe allergic reaction to components of the injection materials, compression of the spinal cord, infection (superficial, deep, abscess and meningitis), nerve or spinal cord damage, paralysis, inability to place the needle properly, arachnoiditis, the possibility of no benefit (pain relief) derived from the injection, or in rare occasions worsening of pain or denervation neuritis. Questions were answered to the patient's satisfaction and the patient wishes to proceed. Alternative options for treatment have previously been discussed and explored with the patient.  The patient denies taking antiplatelet or anticoagulation medications and has a driver today. A PIV was placed in the upper extremity.     Prior to entry into the fluoroscopy room, the patient was marked on the appropriate side. Once in the procedure room, the patient was laid in the prone position on the fluoroscopic table with pressure points padded. Appropriate monitors were applied and a timeout protocol was performed. A sterile prep with ChloraPrep x2 was performed and a sterile drape was applied.    DESCRIPTION OF PROCEDURE:  With the patient in prone position and all routine monitoring applied to include blood pressure and continuous pulse oximetry, the lumbar back was widely prepped with chlorhexadine and the operative area was draped in sterile fashion. Meticulous aseptic technique was maintained.    The anatomic position of the medial branch nerves at the the junction of the right L4, L5 transverse process and the base of the superior articular process was identified, as well as the junction of L5/S1 at the level of sacral ala using a multiplanar c-arm fluoroscopy.  The target area for the L4 medial branch is at the junction of the L5 transverse process and the L5 superior articulating process.  The target area for the L3 medial branch is at the junction of the L4 transverse process and the L4 superior articulating process.  The skin and subcutaneous tissues were anesthetized using small amount of 0.5% lidocaine. Then a 20 gauge 10cm 10 mm active curved tip radiofrequency cannula was placed at each target site using multiplanar fluoroscopic guidance.  Then one additional canula was advanced in a coaxial manner towards the junction of the S1 superior articulating process for the L5 medial branch and the sacral ala and advanced just very slightly pass the contact with os.      With impedance greater than 200 0hms, the cannula was stimulated at 50 Hz up to 1 volt the needle position was redefined such that the patient experienced aching, burning, or tingling back pain. At each level this was obtained at approximately below 1 volt at 50 Hz. Motor stimulation at each level was then undertaken using 2 Hz at up to three times the sensory voltage, and the patient exhibited no radicular motor stimulation with final  needle placement. At this point 1 ml of 2% lidocaine was injected at each level, and 120 seconds later each level was heated to 80 degrees C for 90 seconds using radiofrequency generator.  The cannulas then were rotated 180 degrees and again heated to 80 degrees C for 90 seconds.   Then 0.61ml of a solution containing 2ml 2% lidocaine and 10mg  dexamethasone was injected into each cannula and the needles were removed intact.    The procedure was performed in an identical fashion on the contralateral, left, side.    The patient did tolerate the procedure well and there were no apparent complications. All injection sites were sterilely dressed. A neurological assessment 15 minutes following the procedure was unchanged. The patient was discharged after an appropriate period of observation and post procedural education was given.    Total Fluoroscopic time 21 seconds.  Pre-procedure pain score was 6 /10  Post-procedure pain score is 2/10    Post-procedure the patient was ambulating without any apparent weakness    DISPOSITION:     Follow up with Dr. Oneida Arenas    No orders of the defined types were placed in this encounter.        Requested Prescriptions      No prescriptions requested or ordered in this encounter

## 2020-06-08 ENCOUNTER — Encounter: Admit: 2020-06-08 | Discharge: 2020-06-09 | Payer: MEDICARE

## 2020-06-08 ENCOUNTER — Ambulatory Visit: Admit: 2020-06-08 | Discharge: 2020-06-09 | Payer: MEDICARE | Attending: Anesthesiology | Primary: Anesthesiology

## 2020-06-08 DIAGNOSIS — M47817 Spondylosis without myelopathy or radiculopathy, lumbosacral region: Principal | ICD-10-CM

## 2020-06-08 MED ADMIN — lidocaine (XYLOCAINE) 20 mg/mL (2 %) injection: @ 13:00:00 | Stop: 2020-06-08

## 2020-06-08 MED ADMIN — dexamethasone (DECADRON) injection: @ 13:00:00 | Stop: 2020-06-08

## 2020-06-08 MED ADMIN — lidocaine (XYLOCAINE) 5 mg/mL (0.5 %) injection: INTRAMUSCULAR | @ 13:00:00 | Stop: 2020-06-08

## 2020-06-10 NOTE — Unmapped (Deleted)
Otolaryngology-Head and Neck Surgery Consult Note    Date of Service:  June 09, 2020 10:34 PM  Referring Provider: Dr. Nadean Corwin MD  Reason for Consult: Right tonsillar mass         No chief complaint on file.      History of Present Illness:    Ketzia Guzek is a 70 y.o. Caucasian  pleasant female being seen in consultation at the request of Dr. Lorie Apley for evaluation and opinion of a right tonsillar mass seen on CT imaing from 05/28/20.     The patient denies fevers, chills, weight loss, SOB, pain, dysphagia, odynophagia, hoarseness, hemoptysis, otalgia, or neck masses/growths.      Past Medical History    Past Medical History:   Diagnosis Date   ??? Alcoholism (CMS-HCC)    ??? COPD (chronic obstructive pulmonary disease) (CMS-HCC)    ??? GERD (gastroesophageal reflux disease)     Take OTC meds for this   ??? Hyperlipidemia Years ago    cannot take statins   ??? Hypertension    ??? Status epilepticus (CMS-HCC)    ??? Stroke (CMS-HCC) 2013    TIA         Past Surgical History    Past Surgical History:   Procedure Laterality Date   ??? by pass surgery  2006    Aotic Femoral bypass [name])    ??? FRACTURE SURGERY      Left wrist-metal in place   ??? PR COLONOSCOPY W/BIOPSY SINGLE/MULTIPLE N/A 05/20/2019    Procedure: COLONOSCOPY, FLEXIBLE, PROXIMAL TO SPLENIC FLEXURE; WITH BIOPSY, SINGLE OR MULTIPLE;  Surgeon: Leland Her, MD;  Location: [facility];  Service: Gastroenterology   ??? PR COLSC FLX W/RMVL OF TUMOR POLYP LESION SNARE TQ N/A 05/20/2019    Procedure: COLONOSCOPY FLEX; W/REMOV TUMOR/LES BY SNARE;  Surgeon: Leland Her, MD;  Location: [facility];  Service: Gastroenterology         Medications      Current Outpatient Medications   Medication Sig Dispense Refill   ??? albuterol 2.5 mg /3 mL (0.083 %) nebulizer solution      ??? alendronate (FOSAMAX) 70 MG tablet      ??? amLODIPine (NORVASC) 5 MG tablet Take 1 tablet (5 mg total) by mouth daily. 90 tablet 1   ??? aspirin 81 MG chewable tablet Chew 1 tablet (81 mg total) daily. 30 tablet 0   ??? carvediloL (COREG) 6.25 MG tablet Take 1 tablet (6.25 mg total) by mouth Two (2) times a day. 180 tablet 1   ??? cyclobenzaprine (FLEXERIL) 10 MG tablet 10 mg Three (3) times a day as needed for muscle spasms.      ??? empty container (SHARPS-A-GATOR DISPOSAL SYSTEM) Misc Use as directed for sharps disposal 1 each 2   ??? evolocumab (REPATHA SURECLICK) 140 mg/mL PnIj Inject the contents of 1 pen (140 mg) under the skin every fourteen (14) days. 6 mL 3   ??? gabapentin (NEURONTIN) 100 MG capsule Take 1 capsule (100 mg total) by mouth Three (3) times a day. 270 capsule 3   ??? lisinopriL (PRINIVIL,ZESTRIL) 20 MG tablet Take 1 tablet (20 mg total) by mouth daily. 90 tablet 3   ??? naproxen (NAPROSYN) 500 MG tablet Take 500 mg by mouth two (2) times a day as needed.      ??? omeprazole (PRILOSEC) 20 MG capsule Take 1 capsule by mouth daily.     ??? predniSONE (DELTASONE) 20 MG tablet Take  2 tablets (40 mg total) by mouth daily. As directed by your pulmonologist. (Patient taking differently: Take 40 mg by mouth once as needed. As directed by your pulmonologist.) 10 tablet 0   ??? tiotropium bromide (SPIRIVA RESPIMAT) 2.5 mcg/actuation inhalation mist Inhale 2 puffs daily. 4 g 3   ??? traMADoL (ULTRAM) 50 mg tablet Take 2 tablets (100 mg total) by mouth every six (6) hours as needed for pain. (Patient taking differently: Take 100 mg by mouth once as needed for pain. ) 40 tablet 0     No current facility-administered medications for this visit.         Allergies    Atorvastatin, Carvedilol, and Metoprolol      Social History:    Tobacco use:  reports that she has been smoking cigarettes. She started smoking about 47 years ago. She has a 40.00 pack-year smoking history. She has never used smokeless tobacco.  Alcohol use:  reports previous alcohol use.  Drug use:  reports no history of drug use.    Occupation:  ***  Family: ***      Family History    The patient's family history includes Alcohol abuse in her brother, father, maternal grandmother, mother, and sister; Asthma in her brother and sister; COPD in her brother and sister; Diabetes in her sister; Early death in her father; Glaucoma in her mother; Heart disease in her father, mother, and sister; Hyperlipidemia in her sister; Hypertension in her brother, father, and sister; Neuropathy in her mother; Stroke in her maternal grandmother and mother..      Review of Systems  A full 12-system review of systems is documented and negative except as noted in HPI.  Patient intake form reviewed and scanned into the patient's EMR under media.      Objective     Vital Signs  There were no vitals taken for this visit.      Physical Exam    General:  NAD, AAO, well developed, well nourished, well groomed, pleasant female, speaks in a clear voice, no stridor.  Psychiatric/Neuro:  A&Ox3, Normal mood and affect, Following commands, MAE. Cranial nerves 2-12 intact, No focal deficits.   Head and Face: Skin with no masses or lesions, no facial tenderness, no masses palpated in bilateral parotid or submandibular glands.  Facial strength and sensation intact bilaterally.   Eyes:  EOM Intact, PERRLA, sclera anicteric, no conjunctival injection, no diplopia or epiphora   Nose:  Normal external nasal structure. Anterior rhinoscopy shows clear mucosa, intact septum, and normal turbinates without any mucosal lesions.   Ears:    Right - normal auricle, clear EAC, TM clear and mobile on otoscopy.     Left - normal auricle, clear EAC, TM clear and mobile on otoscopy  Hearing:  Normal hearing to whispered voice.  Oral Cavity:  Tongue midline and mobile without any lesions.  Remainder of mucosa clear of any lesions, pink and moist.  Floor of mouth is soft.  Clear saliva from bilateral Stenson and Wharton ducts.    Oropharynx:  No masses or lesions.  Tonsil are {tonsil size:48500}.  BOT soft, soft palate is mobile and symmetric, posterior pharyngeal wall clear of any lesions.    Neck:  No masses, no thyroid nodules, no thyromegaly, trachea midline. Normal laryngeal elevation.  Larynx moves freely over the prevertebral fascia.    Lymphatics:  No cervical lymphadenopathy.  Respiratory:  Normal respiratory effort, symmetric chest rise, no accessory muscle usage.  Cardiovascular:  No clubbing, cyanosis,  swelling, edema or varicosities in extremities, Regular Rate.    ***    Flexible fiberoptic laryngoscopy - Procedure Note    Pre-operative Diagnosis: No diagnosis found.    Post-operative Diagnosis: same    Anesthesia: Lidocaine 4% and Oxymetazoline 0.05%    Surgeon: Farris Has. Roma Schanz, MD    Endoscopy Type:  Flexible Fiberoptic Laryngoscopy     Indications: To better evaluate the patient???s ***  Procedure Details:    The patient was placed in the sitting position.  After topical anesthesia and decongestion, the 4 mm laryngoscope was passed.  The nasal cavities, nasopharynx, oropharynx, hypopharynx, and larynx were all examined.  Vocal cords were examined during respiration and phonation.  The following findings were noted:    Findings:  Nasal cavities:  Normal mucosa, patent, no masses, lesions, normal turbinates and septum, mucosa pink and moist.  Nasopharynx:  Normal pink moist mucosa, normal eustachian tubes, fossa of Rosenmueller clear, no masses.  Oropharynx:  Normal palate, tonsils, pharyngeal wall, and base of tongue, no masses, mucosa pink and moist.  Valleculae clear, no pooling.  Epiglottis crisp.    Hypopharynx:  Normal pharyngeal walls and pyriform sinuses, no pooling of secretions  Larynx:  Mucosa of the supraglottis, false and true vocal cord is clear without lesion.  Right vocal cord: {vocal cord mobillity:48501}  Left vocal cord:  {vocal cord mobillity:48501}  Visualized subglottis is patent.    Condition:  Stable.  Patient tolerated procedure well.    Complications:  None    Imaging    05/28/20 CT Maxillofacial:  IMPRESSION:  No mass identified in the hard palate as clinically questioned.    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 Recommend direct visualization and biopsy for tissue diagnosis.  - Prominent right level 2 lymph nodes with suspicious morphology concerning for nodal metastasis.  - Prominent thyroid nodules as above, consider biopsy based upon and white paper criteria.    I have personally reviewed all pertinent imaging results.    Test Results    05/28/20 Surgical Pathology Exam:  Diagnosis   A: Palate lesion, right, biopsy  - Three small fragments of benign squamous epithelium with hyperkeratosis (see comment)  - Scant benign submucosa with stromal hemosiderin  - No dysplasia or malignancy identified    Diagnosis Comment    The specimen consists of three tiny fragments (2 mm in gross aggregate) of hyperkeratotic surface squamous epithelium with minimal submucosa present for evaluation. 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 The findings here do not account for a submucosal mass lesion, and additional clinical and radiologic correlation is recommended with consideration for additional tissue sampling as clinically indicated.        I have personally reviewed all pertinent test results.    Assessment/Plan:  Jouri Threat is  a  70 y.o. year old female presenting with a right tonsillar mass     -  - They have my contact information and know to contact me with any questions or concerns.        ***

## 2020-06-11 ENCOUNTER — Encounter: Admit: 2020-06-11 | Discharge: 2020-06-12 | Payer: MEDICARE | Attending: Otolaryngology | Primary: Otolaryngology

## 2020-06-11 DIAGNOSIS — C109 Malignant neoplasm of oropharynx, unspecified: Principal | ICD-10-CM

## 2020-06-11 DIAGNOSIS — C760 Malignant neoplasm of head, face and neck: Principal | ICD-10-CM

## 2020-06-11 MED ORDER — OXYCODONE 5 MG TABLET
ORAL_TABLET | Freq: Four times a day (QID) | ORAL | 0 refills | 8 days | Status: CP | PRN
Start: 2020-06-11 — End: ?
  Filled 2020-06-11: qty 30, 7d supply, fill #0

## 2020-06-11 MED ORDER — LIDOCAINE HCL 2 % MUCOSAL SOLUTION
OROMUCOSAL | 1 refills | 1 days | Status: CP | PRN
Start: 2020-06-11 — End: 2020-07-11
  Filled 2020-06-11: qty 100, 1d supply, fill #0

## 2020-06-11 NOTE — Unmapped (Signed)
Otolaryngology-Head and Neck Surgery Consult Note    Date of Service:  June 11, 2020  Referring Provider: Sarajane Marek, MD  Reason for Consult: Right tonsil mass      Chief Complaint   Patient presents with   ??? New Diagnosis     History of Present Illness:    Tanya Gordon is a 70 y.o. Caucasian  pleasant female being seen in consultation at the request of Dr. Harvie Heck for evaluation and opinion of a right tonsil mass seen on CT from 05/28/20.    The patient presents today for further evaluation and discussion of treatment options. She was evaluated in February by Dr. Joretta Bachelor for right-sided otalgia and associated throat pain. These symptoms began around 5 months ago, and are exacerbated by talking, swallowing, and yawning. The patient reports that she has been treated with antibiotics and steroids without improvement. She was seen in Dr. Barbaraann Boys office on 05/28/20 where a small lesion was noted to the soft palate; biopsy at the time was benign. A CT neck later that day demonstrated a mass in the right tonsil suspicious for a malignancy. She denies noticing any masses in the neck, and denies weight loss. She is not currently taking blood thinners.     The patient is a current smoker, citing 1 ppd at the moment. However, she has smoked 2 ppd or more in the past, approximately 80-pack-year history. The patient has had bilateral aortic femoral bypasses in 2005/6, and since then has had issues with hip pain. Denies calf pain.     The patient denies fevers, chills, weight loss, SOB, hoarseness, hemoptysis, otalgia, or neck masses/growths.    Past Medical History    Past Medical History:   Diagnosis Date   ??? Alcoholism (CMS-HCC)    ??? COPD (chronic obstructive pulmonary disease) (CMS-HCC)    ??? GERD (gastroesophageal reflux disease)     Take OTC meds for this   ??? Hyperlipidemia Years ago    cannot take statins   ??? Hypertension    ??? Status epilepticus (CMS-HCC)    ??? Stroke (CMS-HCC) 2013    TIA   Peripheral vascular disease    Past Surgical History    Past Surgical History:   Procedure Laterality Date   ??? by pass surgery  2006    Aotic Femoral bypass [name])    ??? FRACTURE SURGERY      Left wrist-metal in place   ??? PR COLONOSCOPY W/BIOPSY SINGLE/MULTIPLE N/A 05/20/2019    Procedure: COLONOSCOPY, FLEXIBLE, PROXIMAL TO SPLENIC FLEXURE; WITH BIOPSY, SINGLE OR MULTIPLE;  Surgeon: Leland Her, MD;  Location: [facility];  Service: Gastroenterology   ??? PR COLSC FLX W/RMVL OF TUMOR POLYP LESION SNARE TQ N/A 05/20/2019    Procedure: COLONOSCOPY FLEX; W/REMOV TUMOR/LES BY SNARE;  Surgeon: Leland Her, MD;  Location: [facility];  Service: Gastroenterology     Medications      Current Outpatient Medications   Medication Sig Dispense Refill   ??? albuterol 2.5 mg /3 mL (0.083 %) nebulizer solution      ??? alendronate (FOSAMAX) 70 MG tablet      ??? amLODIPine (NORVASC) 5 MG tablet Take 1 tablet (5 mg total) by mouth daily. 90 tablet 1   ??? aspirin 81 MG chewable tablet Chew 1 tablet (81 mg total) daily. 30 tablet 0   ??? carvediloL (COREG) 6.25 MG tablet Take 1 tablet (6.25 mg total) by mouth Two (2) times a  day. 180 tablet 1   ??? cyclobenzaprine (FLEXERIL) 10 MG tablet 10 mg Three (3) times a day as needed for muscle spasms.      ??? empty container (SHARPS-A-GATOR DISPOSAL SYSTEM) Misc Use as directed for sharps disposal 1 each 2   ??? evolocumab (REPATHA SURECLICK) 140 mg/mL PnIj Inject the contents of 1 pen (140 mg) under the skin every fourteen (14) days. 6 mL 3   ??? gabapentin (NEURONTIN) 100 MG capsule Take 1 capsule (100 mg total) by mouth Three (3) times a day. 270 capsule 3   ??? lisinopriL (PRINIVIL,ZESTRIL) 20 MG tablet Take 1 tablet (20 mg total) by mouth daily. 90 tablet 3   ??? naproxen (NAPROSYN) 500 MG tablet Take 500 mg by mouth two (2) times a day as needed.      ??? omeprazole (PRILOSEC) 20 MG capsule Take 1 capsule by mouth daily.     ??? predniSONE (DELTASONE) 20 MG tablet Take 2 tablets (40 mg total) by mouth daily. As directed by your pulmonologist. (Patient taking differently: Take 40 mg by mouth once as needed. As directed by your pulmonologist.) 10 tablet 0   ??? tiotropium bromide (SPIRIVA RESPIMAT) 2.5 mcg/actuation inhalation mist Inhale 2 puffs daily. 4 g 3   ??? traMADoL (ULTRAM) 50 mg tablet Take 2 tablets (100 mg total) by mouth every six (6) hours as needed for pain. (Patient taking differently: Take 100 mg by mouth once as needed for pain. ) 40 tablet 0     No current facility-administered medications for this visit.     Allergies    Atorvastatin, Carvedilol, and Metoprolol    Social History:    Tobacco use:  reports that she has been smoking cigarettes. She started smoking about 47 years ago. She has a 40.00 pack-year smoking history. She has never used smokeless tobacco.  Alcohol use:  reports previous alcohol use.  Drug use:  reports no history of drug use.    Family: Lives in a mobile home in [place]    Family History    The patient's family history includes Alcohol abuse in her brother, father, maternal grandmother, mother, and sister; Asthma in her brother and sister; COPD in her brother and sister; Diabetes in her sister; Early death in her father; Glaucoma in her mother; Heart disease in her father, mother, and sister; Hyperlipidemia in her sister; Hypertension in her brother, father, and sister; Neuropathy in her mother; Stroke in her maternal grandmother and mother.    Review of Systems  A full 12-system review of systems is documented and negative except as noted in HPI.  Patient intake form reviewed and scanned into the patient's EMR under media.    Objective     Vital Signs  BP 140/85  - Pulse 87  - Temp 36.7 ??C (98 ??F)  - Resp 18  - Ht 167.6 cm (5' 6)  - Wt 74.8 kg (165 lb)  - SpO2 99%  - BMI 26.63 kg/m??     Physical Exam    General:  NAD, AAO, well developed, well nourished, well groomed, pleasant female, speaks in a clear voice, no stridor.  Psychiatric/Neuro:  A&Ox3, Normal mood and affect, Following commands, MAE. Cranial nerves 2-12 intact, No focal deficits.   Head and Face: Skin with no masses or lesions, no facial tenderness, no masses palpated in bilateral parotid or submandibular glands.  Facial strength and sensation intact bilaterally. Melanotic changes over her face, particularly at the right temple.  Eyes:  EOM  Intact, PERRLA, sclera anicteric, no conjunctival injection, no diplopia or epiphora. Right sided cataract.   Nose:  Normal external nasal structure. Anterior rhinoscopy shows clear mucosa, intact septum, and normal turbinates without any mucosal lesions. Left-sided septal deviation.   Ears: Normal auricles.  Hearing:  Normal hearing to whispered voice.  Oral Cavity: Tongue midline and mobile without any lesions. Floor of mouth is soft.  Clear saliva from bilateral Stenson and Wharton ducts. Leukoplakic/leukoedema changes to the bilateral buccal mucosa. Tobacco stained teeth and tongue. Poor dentition.    Oropharynx: BOT soft, soft palate is mobile and symmetric, posterior pharyngeal wall clear of any lesions. Symmetric elevation of the soft palate. There is a bulge of the right tonsillar fossa, firm to palpation. This is at the junction of the posterolateral tongue and the anterior tonsillar pillar.   Neck: No palpable masses. No thyroid nodules, no thyromegaly, trachea midline. Normal laryngeal elevation.  Larynx moves freely over the prevertebral fascia.    Lymphatics: As above.  Respiratory:  Normal respiratory effort, symmetric chest rise, no accessory muscle usage.  Cardiovascular:  No clubbing, cyanosis, swelling, edema or varicosities in extremities, Regular Rate.    Minor Procedure Note:  Pre-operative diagnosis: Right tonsil mass  Post-operative diagnosis: same  Procedure: Cup forceps biopsy of the right tonsil    Surgeon: Farris Has. Roma Schanz    Anesthesia: 1%Lidocaine with 1:100,000 epinephrine local injection 1 cc    Indications: To obtain tissue sample for pathologic analysis of the right tonsil mass.    Procedure Detail: Informed consent was obtained for the cup forceps biopsy. The risks and benefits were discussed with the patient, including but not limited to bleeding and pain. A brief time out performed to indicate the correct patient, procedure, side, allergies and any other pertinent information (eg: blood thinners). 1% lido 1:100000 w/ epi was injected into the right tonsil mass. Using cup forceps, four samples were grasped. Hemostasis was achieved via direct pressure. Patient tolerated the procedure well. The biopsy was sent to pathology for processing and evaluation.       Laryngoscopy  Procedure Note    Pre-operative Diagnosis: Right tonsil mass, smoking history    Post-operative Diagnosis: same    Anesthesia: None    Surgeon: Farris Has. Raymie Trani    Endoscopy Type:  Flexible Fiberoptic Laryngoscopy     Laryngoscope H (serial number [id number]) was used during this visit on June 11, 2020.    Indications: To better evaluate the patient???s tumor site    Procedure Details:    The patient was placed in the sitting position.  After topical anesthesia and decongestion, the 4 mm laryngoscope was passed.  The nasal cavities, nasopharynx, oropharynx, hypopharynx, and larynx were all examined.  Vocal cords were examined during respiration and phonation.  The following findings were noted:    Findings:  Nasal cavities:  Normal mucosa, patent, no masses, lesions, normal turbinates and septum, mucosa pink and moist.  Nasopharynx:  Normal pink moist mucosa, normal eustachian tubes, no masses.   Oropharynx:  Normal palate, tonsils, and pharyngeal wall, mucosa pink and moist. Papilloma on the inferior aspect of the lateral pharyngeal wall on the left. On the right BOT, there is a mucocele versus extension of the tumor onto the tongue.   Hypopharynx: Normal pharyngeal walls and pyriform sinuses, no pooling of secretions. Edema of the arytenoids and post cricoid space. Larynx:  Supraglottis, false and true vocal cord are normal.  Vocal cord mobility is normal.  Subglottis is patent.  Condition:  Stable.  Patient tolerated procedure well.    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  Nodular mucosal thickening of the soft palate, suspicious for synchronous tumor versus spread of the oral pharyngeal cancer. Recommend direct visualization and biopsy for tissue diagnosis.  ??  Prominent right level 2 lymph nodes with suspicious morphology concerning for nodal metastasis.  ??  Prominent thyroid nodules as above, consider biopsy based upon and white paper criteria.    I have personally reviewed all pertinent imaging results.    Test Results    05/28/20 Right palate biopsy:  Diagnosis   A: Palate lesion, right, biopsy  - Three small fragments of benign squamous epithelium with hyperkeratosis (see comment)  - Scant benign submucosa with stromal hemosiderin  - No dysplasia or malignancy identified     I have personally reviewed all pertinent test results.    Assessment/Plan:  Tanya Gordon is a 70 y.o. year old female presenting with a right tonsil mass suspicious for malignancy, cT2-3N1Mx.     - I had a discussion with the patient regarding her right tonsil mass. On exam, she has a firm mass to the junction of her anterior tonsillar pillar and posterolateral tongue concerning for malignancy. We discussed that tonsil cancers are often caused by HPV; however, the patient has an extensive smoking history which likely contributed significantly. I elected to biopsy the tumor today to confirm diagnosis.  Based on the extent of the mass and proximity to vasculature in the parapharyngeal space I recommended definitive chemoradiotherapy.  - Ordered a PET/CT to complete staging.   - Placed referrals to med/onc and rad/onc.   - Will prescribe Oxycodone and viscous lidocaine for pain management. - Follow up with me in 4 months after completion of treatment.  - They have my contact information and know to contact me with any questions or concerns.       Scribe's Attestation: Farris Has. Roma Schanz, MD obtained and performed the history, physical exam and medical decision making elements that were entered into the chart. Signed by Earvin Hansen and Rozann Lesches, Scribes, on June 11, 2020 at 9:54 AM.    ----------------------------------------------------------------------------------------------------------------------  June 14, 2020 5:18 PM. Documentation assistance provided by the Scribe. I was present during the time the encounter was recorded. The information recorded by the Scribe was done at my direction and has been reviewed and validated by me.  ----------------------------------------------------------------------------------------------------------------------

## 2020-06-11 NOTE — Unmapped (Addendum)
At [facility], we believe in information transparency, and we believe you deserve to see your information as soon as it is available. We believe this builds trust and better relationships.    We release most results to you as soon as they are available. Therefore, you may see some results even before we do. Please give [place] 2 business days to review and let you know our thoughts. We look at every result. We will contact you with any results that concern [place].    If your results are not concerning, we will mail a letter, or send an online message about the results. If your results are concerning, we may reach out by phone or schedule a follow-up visit. However, if you have an immediate concern, you can send [place] a message or call our clinic. Otherwise we prefer that you wait 2 business days for [place] to contact you or that we discuss the results at your next appointment.   Vedia Pereyra: [phone]    For urgent nursing questions please call Pricilla Larsson, triage nurse, at [phone].    For oncology related questions, call the Nurse Navigator, Byron, at [phone].    For surgery scheduling please call, Oren Section, at [phone].    For scheduling clinic appointments please call [phone].    For appointments or questions about radiology appointments please call [phone];  Option 1 for CT, MRI, and ultrasounds.   Option 2 for PET scans.  Your ordering provider will contact you with results.    FAX: [phone] Attn: Margaretmary Dys, RN    Please include Name and Date of Birth on all faxes.  Please allow 7-10 Business Days to complete any FMLA or Disability paperwork.    Non-urgent concerns can be communicated through our secure messaging service through your [facility].   Non-urgent concerns voiced via MyChart will be addressed within 24 business hours.   Please remember, clinic is only open from 8am-4:30pm and is closed over the weekend as well as major holidays.   MyChart messages will not be seen or answered during times clinic is closed.  All MyChart messages are filtered through the ENT Nurse Triage Pool prior to directly reaching your provider.    Please call [phone] for urgent matters.  After hours, please call [phone] and ask for the ENT physician on call.  If you are concerned, please do not hesitate to seek medical care at your local emergency department.

## 2020-06-14 NOTE — Unmapped (Signed)
Laryngoscope serial number [id number] was used during this visit on 06/11/2020.    Salomon Mast, RN

## 2020-06-15 NOTE — Unmapped (Signed)
RE: please ensure that pt is aware of PET appointment  Received: Today  Cindy Hazy  Suzy Bouchard, RN  Patient is aware :)

## 2020-06-15 NOTE — Unmapped (Signed)
Left a message trying to relay the results of her recent biopsies.  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  I will make sure she has consultations with medical and radiation oncology.    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  A single focus of submucosa with invasive carcinoma is present

## 2020-06-15 NOTE — Unmapped (Signed)
-----   Message from Suzy Bouchard, [place] sent at 06/15/2020  2:59 PM EDT -----  Regarding: please ensure that pt is aware of PET appointment  ENT team,    Please ensure that pt is aware of PET appointment.    Thank you.  ----- Message -----  From: Marolyn Haller  Sent: 06/15/2020   2:21 PM EDT  To: Milbert Coulter, Suzy Bouchard, RN, #  Subject: RE: Clinic note 06/11/20 needs signature          I am not sure. Christina @ [phone] is the one that called the schedulers to schedule the appointment for this patient.    Thanks,  Geanie Berlin  ----- Message -----  From: Milbert Coulter  Sent: 06/15/2020   2:15 PM EDT  To: Marolyn Haller, Suzy Bouchard, RN, #  Subject: RE: Clinic note 06/11/20 needs signature          Adding Tanya Gordon is the patient aware of the PET appointment?    Thank you,  Fleet Contras  ----- Message -----  From: Suzy Bouchard, RN  Sent: 06/15/2020   2:00 PM EDT  To: Milbert Coulter, #  Subject: RE: Clinic note 06/11/20 needs signature          Thank you, Fleet Contras.     Is the pt aware of the appointment?  ----- Message -----  From: Milbert Coulter  Sent: 06/15/2020   7:47 AM EDT  To: Suzy Bouchard, RN, Noe Gens, MD  Subject: RE: Clinic note 06/11/20 needs signature          So Tanya Gordon has already worked the PET and gotten the authorization.    Fleet Contras  ----- Message -----  From: Noe Gens, MD  Sent: 06/14/2020   5:24 PM EDT  To: Milbert Coulter  Subject: RE: Clinic note 06/11/20 needs signature          done  ----- Message -----  From: Milbert Coulter  Sent: 06/14/2020   9:21 AM EDT  To: Milbert Coulter, Penelope Galas, #  Subject: Clinic note 06/11/20 needs signature              Hi Dr Roma Schanz and Jerrye Beavers,    The clinic note from 06/11/20 needs your signature so we can use it for the authorization. Please let us know when signed note is available.    Best regards,  Milbert Coulter, RN   [facility] Utilization Manager   Pre-Arrival Radiology  P: [phone]  F: [phone]

## 2020-06-18 ENCOUNTER — Encounter: Admit: 2020-06-18 | Discharge: 2020-06-19 | Payer: MEDICARE

## 2020-06-18 MED ADMIN — Fluorine F-18 FDG 4-40 mCi IV: 11.55 | INTRAVENOUS | @ 19:00:00 | Stop: 2020-06-18

## 2020-06-21 NOTE — Unmapped (Signed)
Called to report the results of her recent PET scan.  This showed only local regional disease.  There was concern for bilateral level 2 lymph nodes.  Her p16 status was positive, but her HPV 16 and 18 ISH was negative.  They are sending this out for further analysis for other subtypes.  We also discussed tumor board consensus which was for definitive chemo rads.    She has appointments with both radiation oncology and medical oncology.  We also discussed the extreme importance of her stopping smoking.  She has been linked with smoking cessation and will do her best to quit.  I will see her back in 4 months.    CT Maxillofacial Wo Contrast    Result Date: 05/28/2020  EXAM: Computed tomography, maxillofacial area without contrast material. DATE: 05/28/2020 2:59 PM ACCESSION: [id number] UN DICTATED: 05/28/2020 3:43 PM INTERPRETATION LOCATION: [facility]     CLINICAL INDICATION: 70 years old Female with right palate lesion  - K13.79 - Lesion of palate      COMPARISON: None     TECHNIQUE: Axial CT images through the face without contrast. Coronal and sagittal reformatted images, bone and soft tissue algorithm are provided.     FINDINGS: There is no maxillofacial fracture identified. The visualized paranasal sinuses are pneumatized and free of fluid. There is rightward nasal septum deviation. The mastoid air cells are clear. The globes are intact. There is no mass lesion identified.     Odontogenic disease with multiple missing teeth and incidental metallic implants causing streak artifact. Degenerative changes of the partially visualized atlantodental joint.         No mass identified in the hard palate as clinically questioned.    CT Soft Tissue Neck with contrast    Result Date: 05/28/2020  EXAM: Computed tomography, soft tissue neck with contrast material. DATE: 05/28/2020 2:59 PM ACCESSION: [id number] UN DICTATED: 05/28/2020 4:17 PM INTERPRETATION LOCATION: [facility]     CLINICAL INDICATION: 70 years old Female with right palate lesion  - K13.79 - Lesion of palate      COMPARISON: None     TECHNIQUE: Axial CT images of the neck from the skull base through the thoracic inlet after the administration of intravenous contrast. Coronal and sagittal reformatted images, bone and soft tissue algorithm are provided.     FINDINGS: The visualized portions of the brain and the posterior fossa are normal.     The paranasal sinuses are normal. The orbits are normal. Rightward deviation of the nasal septum with right projecting 4 mm spur. The nasal cavity and nasopharynx are normal.     Dental amalgam related metallic artifacts preclude optimal evaluation of the oral cavity and oropharynx. Nodular mucosal thickening of the soft palate (image 56 of series 11) which is contiguous with a mildly enhancing right tonsillar mass measuring 1.5 x 1.3 x 1.8 cm in TV x AP x CC dimensions (image 118 of series 4). The parapharyngeal spaces are clear. The salivary glands are normal.     The larynx and hypopharynx are normal.     Prominent right level 2 lymph nodes including 8 mm rounded node on image 124 series 4     Left thyroid lobe nodules measuring up to 1.8 cm axial (image 197 of series 4) and 2 cm coronal (9:38).     Degenerative disc changes at suggestion of at least moderate spinal canal narrowing at C5-C6. The lung apices are normal.      Normal intravascular enhancement is seen.  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 Nodular mucosal thickening of the soft palate, suspicious for synchronous tumor versus spread of the oral pharyngeal cancer. Recommend direct visualization and biopsy for tissue diagnosis.     Prominent right level 2 lymph nodes with suspicious morphology concerning for nodal metastasis.     Prominent thyroid nodules as above, consider biopsy based upon and white paper criteria.    Statistic Only Fluoro Count    Result Date: 06/08/2020  This order was placed for internal RIS purposes.  This order does not require a diagnostic report.     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  Initial treatment strategy.Marland Kitchen     RADIOPHARMACEUTICAL: F-18 Fluorodeoxyglucose (FDG), IV  TECHNIQUE: Following the administration of radiopharmaceutical, PET images were acquired using 3D-acquisition and reconstructed with attenuation-correction.  A single-breathhold CT scan was obtained at quiet end-expiration without oral or IV contrast for anatomic localization and attenuation-correction. Additional dedicated head and neck PET/CT images were acquired. The coregistered PET and CT images were evaluated in axial, coronal, and sagittal planes.  Scanner: [employer] mCT [employer] Injected activity: 12 mCi Uptake time: 72 minutes (body), 85 minutes (head and neck) Site of injection: Right antecubital fossa Serum glucose: 92 mg/dL     COMPARISON: Chest CT dated 01/12/2020, CTA of the abdomen dated 12/13/2018  FINDINGS: HEAD, NECK and SUPRACLAVICULAR REGIONS: Intense uptake in the right palatine tonsil (CT 40). Bilateral level 2A nodes (CT 42), right level 4 node (CT 62) may represent spread of disease.     CHEST: Thyroid: Grossly unremarkable. Breasts: Grossly unremarkable. Lungs and Pleura: Subcentimeter left lower lobe nodule (CT 93) is too small to characterize by PET. No pleural effusion.] Mediastinum, Hila and Axillae: No suspicious hypermetabolic lymph nodes. Cardiovascular: [Coronary artery, aortic, and mitral valve calcifications. No pericardial effusion.     ABDOMEN and PELVIS: Liver: No suspicious hypermetabolic lesions. Gallbladder: No cholelithiasis. Spleen: No suspicious hypermetabolic lesions or diffuse uptake. No splenomegaly. Pancreas: No suspicious hypermetabolic lesions. GI/Peritoneum/Mesentery: No suspicious hypermetabolic lesions or diffuse uptake. There is a small fat-containing midline ventral hernia and tiny left fat-containing inguinal hernia. Adrenals: No suspicious hypermetabolic lesions. Kidneys: Hyperattenuating left renal lesion is not metabolically active. GU: Grossly unremarkable. Adenopathy: No suspicious hypermetabolic lymph nodes. Vasculature: Atherosclerotic calcification of the abdominal aorta and its major branches. There is an aortobifemoral bypass graft.     MUSCULOSKELETAL: No suspicious osseous or soft tissue lesions. Degenerative changes of the spine are present.         - Intense uptake in right palatine tonsil with bilateral level 2A and right level 4 nodes, possibly reflecting spread of disease.

## 2020-06-25 NOTE — Unmapped (Signed)
RADIATION ONCOLOGY INITIAL CONSULTATION NOTE    Encounter Date: 06/29/2020  Patient Name: Tanya Gordon  Medical Record Number: [id number]    Referring Physician: Noe Gens, MD  [address]  [place],  [place] [address]  .  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    Xerostomia: 0 No Problems  Dysphagia:  1 Symptomatic, able to eat regular diet    RECOMMENDATIONS:  Today we discussed the rationale, risks, benefits, and logistics of radiation therapy. I recommended a course of definitive chemoradiotherapy to a total dose of 7000 cGy in 35 daily fractions.  I recommended an intensity modulated radiotherapy technique to minimize radiation dose to normal structures such as the pharyngeal constrictors, salivary glands, skin, spinal cord, and other organs at risk.  I would utilize a simultaneous integrated boost technique to deliver a lower dose to elective lymph node areas.  I recommended concomitant radiosensitizing chemotherapy which would be delivered at the discretion of medical oncology.     I discussed alternatives this treatment, which could include induction chemotherapy followed either by definitive radiotherapy or surgical resection.  I also discussed upfront surgery followed by radiation therapy with or without radiosensitizing chemotherapy, but discussion of a multidisciplinary team thought chemoradiation would give optimal outcomes. Given the patient's overall limited disease burden, I do not feel that she would benefit from induction chemotherapy as multiple randomized studies have failed to demonstrate a benefit in this setting (e.g. Choen et al., JCO 2014; Haddad et al., Lancet Oncol 2013; Hitt et al., Ann Oncol 2014).    Risks of radiation can include but are not limited to: dermatitis, desquamation, fatigue, fibrosis, dysphagia, odynophagia, xerostomia, dysgeusia, hoarseness, and long-term risks of fibrosis, lymphedema, dysphagia, odynophagia, swallowing problems, xerostomia, hypothyroidism, dental disease, osteoradionecrosis, and a low long-term risk of secondary malignancy.    Patient prefers to be treated closer to home. We will send referral to [facility]. We discussed we are happy to see her back if she changes her mind and prefer to be treated here. As she would prefer to get chemotherapy there as well, she will not meet Medical oncology here and we will send that referral.     We discussed the importance of quitting smoking and sent rx for nicotine patches. We also refilled pain medications until she is able to get in to see [name] physicians. Lastly, discussed the importance of dental health with regards to radiation and recommended she visit a dentist for possible extractions prior to radiation.         HISTORY OF PRESENT ILLNESS:  Information pertinent to today's evaluation are the following:    03/2020: Presented with right sided otalgia and throat pain which started ~ 5 months prior and exacerbated by talking, swallowing, yawning.     05/28/20: Seen by Dr.Thorp and biopsied soft palate lesion, benign. Later that day, scans done: CT Maxillofacial: No mass identified in the hard palate as clinically questioned.  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  Nodular mucosal thickening of the soft palate, suspicious for synchronous tumor versus spread of the oral pharyngeal cancer. Recommend direct visualization and biopsy for tissue diagnosis.  Prominent right level 2 lymph nodes with suspicious morphology concerning for nodal metastasis.  Prominent thyroid nodules.  06/11/20: Seen by Dr. Roma Schanz. Exam showed bulge of right tonsillare fossa that is firm to palpation (junction of posterolateral tongue and anterior tonsillar pillar). 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 On the right BOT, there is a mucocele versus extension of the tumor onto the tongue. Right throat biopsy: SqCC. P16+, HPV16/18-    06/18/20: PET/CT: - Intense uptake in right palatine tonsil with bilateral level 2A and right level 4 nodes, possibly reflecting spread of disease.    The patient is a current smoker, citing 1 ppd at the moment. However, she has smoked 2 ppd or more in the past, approximately 80-pack-year history.    Today, she reports her ear pain has been severe since Christmas and she also has severe throat pain which limits her eating sometimes. She does not have difficulty physically swallowing however, but it is limited by pain.     I have reviewed old/outside medical records (please see above for summary).    REVIEW OF SYSTEMS:    A comprehensive review of 10 systems was negative except for pertinent positives noted in HPI.    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    Status epilepticus (CMS-HCC)     Stroke (CMS-HCC) 2013    TIA      Prior Radiation Therapy:no  Pacemaker: no  Pregnancy status: Negative pregnancy test/infertile  Collagen Vascular Disease:no    Past Surgical History:   Procedure Laterality Date    by pass surgery  2006    Aotic Femoral bypass ([place])     FRACTURE SURGERY      Left wrist-metal in place    PR COLONOSCOPY W/BIOPSY SINGLE/MULTIPLE N/A 05/20/2019    Procedure: COLONOSCOPY, FLEXIBLE, PROXIMAL TO SPLENIC FLEXURE; WITH BIOPSY, SINGLE OR MULTIPLE;  Surgeon: Leland Her, MD;  Location: [facility];  Service: Gastroenterology    PR COLSC FLX W/RMVL OF TUMOR POLYP LESION SNARE TQ N/A 05/20/2019    Procedure: COLONOSCOPY FLEX; W/REMOV TUMOR/LES BY SNARE;  Surgeon: Leland Her, MD;  Location: [facility];  Service: Gastroenterology        Family History   Problem Relation Age of Onset    Heart disease Mother     Neuropathy Mother     Alcohol abuse Mother     Stroke Mother     Glaucoma Mother     Heart disease Father     Hypertension Father     Alcohol abuse Father     Early death Father         Died at age 69, heart attack    Heart disease Sister     Asthma Brother     COPD Brother     Alcohol abuse Sister     Alcohol abuse Brother     Alcohol abuse Maternal Grandmother     Stroke Maternal Grandmother     Asthma Sister     COPD Sister     Diabetes Sister     Hyperlipidemia Sister     Hypertension Sister     Hypertension Brother         Social  History     Occupational History    Not on file   Tobacco Use    Smoking status: Current Every Day Smoker     Packs/day: 1.00     Years: 40.00     Pack years: 40.00     Types: Cigarettes     Start date: 07/19/1972    Smokeless tobacco: Never Used    Tobacco comment: pack a day   Vaping Use    Vaping Use: Every day    Substances: Nicotine    Devices: Disposable   Substance and Sexual Activity    Alcohol use: Not Currently     Alcohol/week: 0.0 standard drinks     Comment: sober for 2 yrs     Drug use: No    Sexual activity: Not Currently       ALLERGIES/MEDICATIONS:  Reviewed in EPIC    PHYSICAL EXAM:     Vital Signs for this encounter:  BSA: 1.86 meters squared  Temp 36 ??C (96.8 ??F) (Temporal)  - Wt 74.4 kg (164 lb)  - BMI 26.47 kg/m??   Pain:  Pain Score (0 - 10): 5  Pain Location: Ear, Throat   Karnofsky/Lansky Performance Status: 80, Normal activity with effort; some signs or symptoms of disease (ECOG equivalent 1)  General/Constitutional:   No acute distress, alert and oriented X 4   Neurological:   Normal Gait and Cognition.  CN II-XII focally intact  Ears/Nose/Mouth/Throat: Normal tongue mobility. No Trismus.  Poor Dentition. Fullness of right tonsil with small exophytic lesion between tonsillar pillars.   Lymphatics:  No palpable adenopathy on either side in the occipital, parotid, or facial nodes or in the level 1-6 nodal stations  Skin: No concerning lesions   Respiratory:  Regular work of breathing  Gastrointestinal:  Non-distended, non-tender   Musculoskeletal:  Normal gait   Psychiatric:  Normal affect/mood     SPECIAL DIAGNOSTIC PROCEDURES:   Not Performed    RADIOLOGY:  Imaging was personally reviewed as detailed in the history (see above).      PATHOLOGY:   Pathology was personally reviewed as detailed in the HPI.       Arna Medici, MD PhD  Radiation Oncology PGY-5  Pager: [phone]  06/29/2020 2:14 PM    ATTENDING ATTESTATION:  I saw and evaluated/examined the patient, participating in the key portions of the service.  I discussed the findings, assessment, and plan of care with the resident/PA/NP.  I agree with the findings and plan as documented in their note.    Kayleen Memos. Camelia Phenes, MD  Assistant Professor  Department of Radiation Oncology  [employer] [facility]

## 2020-06-28 NOTE — Unmapped (Deleted)
canceled

## 2020-06-29 ENCOUNTER — Encounter: Admit: 2020-06-29 | Discharge: 2020-07-27 | Payer: MEDICARE

## 2020-06-29 ENCOUNTER — Ambulatory Visit: Admit: 2020-06-29 | Payer: MEDICARE | Attending: Medical Oncology | Primary: Medical Oncology

## 2020-06-29 DIAGNOSIS — C109 Malignant neoplasm of oropharynx, unspecified: Secondary | ICD-10-CM | POA: Insufficient documentation

## 2020-06-29 MED ORDER — OXYCODONE 5 MG TABLET
ORAL_TABLET | Freq: Four times a day (QID) | ORAL | 0 refills | 8 days | Status: CP | PRN
Start: 2020-06-29 — End: ?

## 2020-06-29 MED ORDER — NICOTINE 21 MG/24 HR DAILY TRANSDERMAL PATCH
MEDICATED_PATCH | TRANSDERMAL | 3 refills | 28 days | Status: CP
Start: 2020-06-29 — End: ?

## 2020-06-29 MED ADMIN — oxymetazoline (AFRIN) 0.05 % nasal spray 2 spray: 2 | NASAL | @ 17:00:00 | Stop: 2020-06-29

## 2020-06-29 MED ADMIN — lidocaine 1% in sodium chloride 0.9% nasal spray 2 spray: 2 | NASAL | @ 17:00:00 | Stop: 2020-06-29

## 2020-06-29 NOTE — Unmapped (Signed)
06/29/2020      Subjective/Assessment/Recommendations:    1. Prior radiation: No prior radiation treatment  2. Pacemaker/defibrillator: No  3. Pregnancy: No - history of menopause, hysterectomy, or tubal ligation  4. Patient education: Patient education materials and attending physician packet provided and reviewed with pt and family  5. Meaningful use: Reviewed  6. Resources: deferred      Wt Readings from Last 3 Encounters:   06/11/20 74.8 kg (165 lb)   05/28/20 75.1 kg (165 lb 8 oz)   05/10/20 75.5 kg (166 lb 6.4 oz)     Wt Readings from Last 3 Encounters:   06/11/20 74.8 kg (165 lb)   05/28/20 75.1 kg (165 lb 8 oz)   05/10/20 75.5 kg (166 lb 6.4 oz)     Temp Readings from Last 3 Encounters:   06/11/20 36.7 ??C (98 ??F)   05/28/20 36.7 ??C (98.1 ??F)   03/24/20 36.3 ??C (97.3 ??F) (Temporal)     BP Readings from Last 3 Encounters:   06/11/20 140/85   06/08/20 141/84   05/28/20 147/81     Pulse Readings from Last 3 Encounters:   06/11/20 87   06/08/20 76   05/28/20 77

## 2020-06-30 ENCOUNTER — Ambulatory Visit
Admission: RE | Admit: 2020-06-30 | Discharge: 2020-06-30 | Disposition: A | Discharge status: Home / Self Care | Payer: Self-pay | Source: Ambulatory Visit | Attending: Radiation Oncology | Admitting: Radiation Oncology

## 2020-06-30 ENCOUNTER — Other Ambulatory Visit: Payer: Self-pay | Admitting: Radiation Oncology

## 2020-06-30 DIAGNOSIS — C109 Malignant neoplasm of oropharynx, unspecified: Secondary | ICD-10-CM

## 2020-07-01 ENCOUNTER — Telehealth: Payer: Self-pay | Admitting: Hematology and Oncology

## 2020-07-01 NOTE — Unmapped (Signed)
-----   Message from Jana Half sent at 07/01/2020 10:36 AM EDT -----  Regarding: RE: Referrals to [facility] - Tuesday, 07-13-20  Dr. Andrey Campanile,  Brayton Mars is scheduled for her consults on Tuesday, 07-13-20.  She is seeing Dr. Burnice Logan Iruku in medical oncology and Dr. Lonie Peak in radiation oncology.  Thanks, Rivka Barbara  ----- Message -----  From: Barbaraann Share, MD  Sent: 06/30/2020  11:34 AM EDT  To: Lorin Picket, MD  Subject: RE: [facility]errals to [facility] - pending review           Thank you Gentry Fitz  ----- Message -----  From: Jana Half  Sent: 06/30/2020  11:26 AM EDT  To: Alger Memos, MD, #  Subject: RE: [facility]errals [facility] - pending review           Dr. Andrey Campanile,  I made urgent referrals to [facility] for radiation oncology and medical oncology consults.   Will let you know when Ms. Tensley is scheduled.  Thanks, Rivka Barbara  ----- Message -----  From: Barbaraann Share, MD  Sent: 06/30/2020  10:06 AM EDT  To: Lorin Picket, MD  Subject: RE: [facility]errals [facility] - pending                  Thanks Rivka Barbara, my note is signed now.     ----- Message -----  From: Jana Half  Sent: 06/29/2020   2:57 PM EDT  To: Alger Memos, MD, #  Subject: RE: [facility]errals [facility] - pending                  Dr. Andrey Campanile,  [facility] is pushing Patty Mcculley's scans to [facility].  I will fax the referrals - records as soon as the consult note is signed.  Thanks, Rivka Barbara  ----- Message -----  From: Barbaraann Share, MD  Sent: 06/29/2020   1:45 PM EDT  To: Lorin Picket, MD  Subject: Referrals to [facility]                                Teressa Lower,  I placed a med onc and rad onc consult to [facility] for Ms. Dupre, can you make sure these get scheduled ASAP?  Thank you!  Shanda Bumps

## 2020-07-01 NOTE — Telephone Encounter (Signed)
Received a new pt referral from Dr. Colon Branch for Malignant neoplasm of oropharynx. Cathy Knight has been cld and scheduled to see Dr. Chryl Heck on 5/17 at 11:20am. Pt aware to arrive 20 minutes early. Pt preferred to have her appt on the same day as her radiation appt.

## 2020-07-08 NOTE — Progress Notes (Signed)
Oncology Nurse Navigator Documentation  Placed introductory call to new referral patient Cathy Knight.   Introduced myself as the H&N oncology nurse navigator that works with Dr. Isidore Moos and Dr. Chryl Heck to whom she has been referred by Dr. Colon Branch.  She confirmed understanding of referral.  Briefly explained my role as her navigator, provided my contact information.   Confirmed understanding of upcoming appts and [place] location, explained arrival and registration process.  I explained the purpose of a dental evaluation prior to starting RT, indicated he would be contacted by [facility] to arrange an appt.    I encouraged her to call with questions/concerns as she moves forward with appts and procedures.    She verbalized understanding of information provided, expressed appreciation for my call.   Navigator Initial Assessment . Employment Status:she is retired but working part time restoring homes.  . Currently on FMLA / STD: no . Living Situation: She lives with family . Support System: friends, family . PCP: Vickie Epley MD at [facility].  Marland Kitchen PCD: none . Financial Concerns: no . Transportation Needs: no . Sensory Deficits: no . Language Barriers/Interpreter Needed:  no . Ambulation Needs: no . DME Used in Home: no . Psychosocial Needs:  no . Concerns/Needs Understanding Cancer:  addressed/answered by navigator to best of ability . Self-Expressed Needs: no   Harlow Asa RN, BSN, OCN Head & Neck Oncology Nurse [place] at [facility] Phone # [phone]  Fax # [phone]

## 2020-07-12 NOTE — Unmapped (Signed)
[facility] Refill Coordination Note    Specialty Medication(s) to be Shipped:   General Specialty: Repatha    Other medication(s) to be shipped: No additional medications requested for fill at this time     Tanya Gordon, DOB: 1950-03-07  Phone: [phone] (home)       All above HIPAA information was verified with patient.     Was a Nurse, learning disability used for this call? No    Completed refill call assessment today to schedule patient's medication shipment from the [facility] [phone]).  All relevant notes have been reviewed.     Specialty medication(s) and dose(s) confirmed: Regimen is correct and unchanged.   Changes to medications: Zsazsa reports no changes at this time.  Changes to insurance: No  New side effects reported not previously addressed with a pharmacist or physician: None reported  Questions for the pharmacist: No    Confirmed patient received a Conservation officer, historic buildings and a Surveyor, mining with first shipment. The patient will receive a drug information handout for each medication shipped and additional FDA Medication Guides as required.       DISEASE/MEDICATION-SPECIFIC INFORMATION        For patients on injectable medications: Patient currently has 1 doses left.  Next injection is scheduled for 07/14/20.    SPECIALTY MEDICATION ADHERENCE     Medication Adherence    Patient reported X missed doses in the last month: 0  Specialty Medication: Repatha 140 mg/mL  Patient is on additional specialty medications: No  Patient is on more than two specialty medications: No              Were doses missed due to medication being on hold? No    Repatha 140 mg/ml: 3 days of medicine on hand       REFERRAL TO PHARMACIST     Referral to the pharmacist: Not needed      [facility]     Shipping address confirmed in Epic.     Delivery Scheduled: Yes, Expected medication delivery date: 07/23/20.     Medication will be delivered via UPS to the prescription address in Epic WAM.    Nancy Nordmann [facility] Specialty Technician

## 2020-07-12 NOTE — Progress Notes (Signed)
Head and Neck Cancer Location of Tumor / Histology:  Squamous cell carcinoma of hypopharynx, p16(+)  Patient presented with symptoms of: was evaluated in February by Dr. Cherylann Ratel for right-sided otalgia and associated throat pain. These symptoms began around 5 months ago, and are exacerbated by talking, swallowing, and yawning. The patient reports that she has been treated with antibiotics and steroids without improvement. She was seen in Dr. Aaron Edelman Thorp's office on 05/28/20 where a small lesion was noted to the soft palate; biopsy at the time was benign. A CT neck later that day demonstrated a mass in the right tonsil suspicious for a malignancy. She denies noticing any masses in the neck, and denies weight loss. PET Scan  06/18/2020 IMPRESSION:  --Intense uptake in right palatine tonsil with bilateral level 2A and right level 4 nodes, possibly reflecting spread of disease. CT Neck w/ Contrast 05/28/2020 IMPRESSION:  --Enhancing right tonsillar mass, concerning for primary oropharyngeal cancer.  --Nodular mucosal thickening of the soft palate, suspicious for synchronous tumor versus spread of the oral pharyngeal cancer. Recommend direct visualization and biopsy for tissue diagnosis.  --Prominent right level 2 lymph nodes with suspicious morphology concerning for nodal metastasis.  --Prominent thyroid nodules as above, consider biopsy based upon and white paper criteria. CT Maxillofacial w/o Contrast 05/28/2020 IMPRESSION:  --No mass identified in the hard palate as clinically questioned  Biopsies revealed:  06/11/2020 Diagnosis   A: Throat, right, biopsy - Fragments of keratinizing well differentiated squamous carcinoma.  A single focus of submucosa with invasive carcinoma is present --Addendum   The p16 immunostain is positive. HR HPV study ordered. --Addendum 2 In situ study for HPV 16/18 is negative. Slides will ne sent to [facility] for a broader HR HPV panel --Addendum 3 Noted  07/01/2000: Tissue was sent to [facility] for a broad HR HPV ISH study.  Results were: Negative.  The controls were appropriate  05/28/2020 Diagnosis   A: Palate lesion, right, biopsy - Three small fragments of benign squamous epithelium with hyperkeratosis (see comment) - Scant benign submucosa with stromal hemosiderin - No dysplasia or malignancy identified   Nutrition Status Yes No Comments  Weight changes? '[x]'  '[]'  Lost a few pounds  Swallowing concerns? '[x]'  '[]'  pain  PEG? '[]'  '[x]'     Referrals Yes No Comments  Social Work? '[x]'  '[]'    Dentistry? '[x]'  '[]'    Swallowing therapy? '[x]'  '[]'    Nutrition? '[x]'  '[]'    Med/Onc? '[x]'  '[]'  Dr. Arletha Pili Iruku   Safety Issues Yes No Comments  Prior radiation? '[x]'  '[]'    Pacemaker/ICD? '[x]'  '[]'    Possible current pregnancy? '[]'  '[x]'  No--postmenopausal  Is the patient on methotrexate? '[x]'  '[]'     Tobacco/Marijuana/Snuff/ETOH use: Current every day smoker (smokes about 1 pack/day, which is down from previous history of 2 packs/day). Denies any smokeless tobacco or recreational drug use. 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 Neysa Hotter [facility]) Cup forceps biopsy of the right tonsil  Past/Anticipated interventions by medical oncology, if any:  Met with Dr. Arletha Pili Iruku this morning   Current Complaints / other details: Patient has received the first 2 Moderna vaccines as well as a [place] booster Patient and significant other discussed some issues that might come. Patient continues to smoke but will try to quit. Discussed ways to quit smoking.

## 2020-07-13 ENCOUNTER — Ambulatory Visit
Admission: RE | Admit: 2020-07-13 | Discharge: 2020-07-13 | Disposition: A | Discharge status: Home / Self Care | Payer: Medicare Other | Source: Ambulatory Visit | Attending: Radiation Oncology | Admitting: Radiation Oncology

## 2020-07-13 ENCOUNTER — Encounter: Payer: Self-pay | Admitting: Hematology and Oncology

## 2020-07-13 ENCOUNTER — Encounter: Payer: Self-pay | Admitting: Radiation Oncology

## 2020-07-13 ENCOUNTER — Inpatient Hospital Stay: Payer: Medicare Other

## 2020-07-13 ENCOUNTER — Inpatient Hospital Stay: Payer: Medicare Other | Attending: Hematology and Oncology | Admitting: Hematology and Oncology

## 2020-07-13 ENCOUNTER — Other Ambulatory Visit: Payer: Self-pay

## 2020-07-13 ENCOUNTER — Telehealth: Payer: Self-pay | Admitting: Hematology and Oncology

## 2020-07-13 DIAGNOSIS — C090 Malignant neoplasm of tonsillar fossa: Secondary | ICD-10-CM

## 2020-07-13 DIAGNOSIS — C109 Malignant neoplasm of oropharynx, unspecified: Secondary | ICD-10-CM

## 2020-07-13 DIAGNOSIS — F17210 Nicotine dependence, cigarettes, uncomplicated: Secondary | ICD-10-CM | POA: Insufficient documentation

## 2020-07-13 DIAGNOSIS — R221 Localized swelling, mass and lump, neck: Secondary | ICD-10-CM | POA: Diagnosis not present

## 2020-07-13 DIAGNOSIS — G893 Neoplasm related pain (acute) (chronic): Secondary | ICD-10-CM | POA: Diagnosis not present

## 2020-07-13 DIAGNOSIS — Z79899 Other long term (current) drug therapy: Secondary | ICD-10-CM | POA: Diagnosis not present

## 2020-07-13 DIAGNOSIS — Z1329 Encounter for screening for other suspected endocrine disorder: Secondary | ICD-10-CM

## 2020-07-13 DIAGNOSIS — R634 Abnormal weight loss: Secondary | ICD-10-CM

## 2020-07-13 LAB — CBC WITH DIFFERENTIAL/PLATELET
Abs Immature Granulocytes: 0.03 10*3/uL (ref 0.00–0.07)
Basophils Absolute: 0.1 10*3/uL (ref 0.0–0.1)
Basophils Relative: 1 %
Eosinophils Absolute: 0.2 10*3/uL (ref 0.0–0.5)
Eosinophils Relative: 3 %
HCT: 40.8 % (ref 36.0–46.0)
Hemoglobin: 13.9 g/dL (ref 12.0–15.0)
Immature Granulocytes: 0 %
Lymphocytes Relative: 25 %
Lymphs Abs: 2.2 10*3/uL (ref 0.7–4.0)
MCH: 32.2 pg (ref 26.0–34.0)
MCHC: 34.1 g/dL (ref 30.0–36.0)
MCV: 94.4 fL (ref 80.0–100.0)
Monocytes Absolute: 0.5 10*3/uL (ref 0.1–1.0)
Monocytes Relative: 6 %
Neutro Abs: 6 10*3/uL (ref 1.7–7.7)
Neutrophils Relative %: 65 %
Platelets: 289 10*3/uL (ref 150–400)
RBC: 4.32 MIL/uL (ref 3.87–5.11)
RDW: 13.4 % (ref 11.5–15.5)
WBC: 9.1 10*3/uL (ref 4.0–10.5)
nRBC: 0 % (ref 0.0–0.2)

## 2020-07-13 LAB — CMP (CANCER CENTER ONLY)
ALT: 8 U/L (ref 0–44)
AST: 14 U/L — ABNORMAL LOW (ref 15–41)
Albumin: 3.8 g/dL (ref 3.5–5.0)
Alkaline Phosphatase: 55 U/L (ref 38–126)
Anion gap: 9 (ref 5–15)
BUN: 10 mg/dL (ref 8–23)
CO2: 28 mmol/L (ref 22–32)
Calcium: 9.5 mg/dL (ref 8.9–10.3)
Chloride: 104 mmol/L (ref 98–111)
Creatinine: 0.85 mg/dL (ref 0.44–1.00)
GFR, Estimated: >60 mL/min (ref >60)
Glucose, Bld: 90 mg/dL (ref 70–99)
Potassium: 3.9 mmol/L (ref 3.5–5.1)
Sodium: 141 mmol/L (ref 135–145)
Total Bilirubin: 0.3 mg/dL (ref 0.3–1.2)
Total Protein: 6.6 g/dL (ref 6.5–8.1)

## 2020-07-13 MED ORDER — BUPROPION HCL ER (SR) 150 MG PO TB12: ORAL_TABLET | ORAL | 2 refills | Status: DC

## 2020-07-13 MED ORDER — OXYCODONE HCL 10 MG PO TABS: 10.0000 mg | ORAL_TABLET | Freq: Once | ORAL | 0 refills | Status: DC | PRN

## 2020-07-13 NOTE — Assessment & Plan Note (Signed)
She has severe pain in the right neck radiating to the right ear especially with certain movement related to the primary oropharyngeal cancer.  She is currently taking oxycodone about 2 tablets once a day.  I have clearly recommended that she does not take tramadol while she is taking oxycodone.  She acknowledged it. Medication has been refilled today to the pharmacy of her choice.  We have discussed that concurrent use of tramadol and oxycodone can lead to synergistic side effects which can occasionally be fatal.

## 2020-07-13 NOTE — Assessment & Plan Note (Signed)
This is a very pleasant 70 year old female patient with a newly diagnosed squamous cell carcinoma of the oropharynx, p16 positive referred to medical oncology for recommendations.  She had persistent sore throat and right-sided ear pain which warranted further investigation.  Imaging showed intense uptake in the right tonsil suspicious for malignancy, PET/CT showed uptake in the right palatine tonsil with bilateral level 2A and right level 4 nodes reflecting spread of disease. No evidence of distant metastasis.  Given the bilateral lymphadenopathy, I do agree that chemoradiation would be the best option to proceed with.  We have discussed the curative intent of treatment.  I have discussed about adverse effects of cisplatin including fatigue, nausea, vomiting, myelosuppression, ototoxicity, nephrotoxicity.  She understands that some of the side effects can be permanent.  Hair loss is not usually expected with cisplatin. We have discussed that concurrent therapy can also cause severe mucositis and can limit nourishment and hence we usually recommend a G-tube for nourishment during the treatment.  She is agreeable with this. We have discussed about port for chemotherapy administration since she is a hard stick. She is agreeable to this. We will make a referral to nutrition for discussion.  She has not lost much weight but she thinks she is starting to lose some weight in the past week since she has lost appetite. Smoking cessation strongly encouraged.

## 2020-07-13 NOTE — Telephone Encounter (Signed)
Scheduled appt per 5/17 sch msg. Called pt, no answer. Left msg with appt date and time.  

## 2020-07-13 NOTE — Progress Notes (Signed)
Radiation Oncology         ([phone]) [phone] ________________________________  Initial Outpatient Consultation  Name: Cathy Knight MRN: [id number]  Date: 07/13/2020  DOB: 12-12-1950  CC:Pcp, No  Gretta Began, MD   REFERRING PHYSICIAN: Gretta Began, MD  DIAGNOSIS:    ICD-10-CM   1. Screening for hypothyroidism  Z13.29 TSH  2. 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 buPROPion (WELLBUTRIN SR) 150 MG 12 hr tablet  3. Loss of weight  R63.4 TSH   Cancer Staging Cancer of tonsillar fossa (HCC) Staging form: Pharynx - HPV-Mediated Oropharynx, AJCC 8th Edition - Clinical stage from 07/13/2020: Stage II (cT2, cN2, cM0, p16+) - Signed by Eppie Gibson, MD on 07/13/2020 Stage prefix: Initial diagnosis  Squamous cell carcinoma of oropharynx ([place]) Staging form: Pharynx - HPV-Mediated Oropharynx, AJCC 8th Edition - Clinical stage from 07/13/2020: Stage II (cT1, cN2, cM0) - Signed by Benay Pike, MD on 07/13/2020 Stage prefix: Initial diagnosis   NOTE: We will discuss patient staging and path at tumor board; at this time I have staged her as T2* N2 M0 p16 positive stage II, though she may have stage IVA disease if deemed p16/HPV negative based totality of pathologic tests (*tumor appears to be just over 2 cm on her composite imaging)  CHIEF COMPLAINT: Here to discuss management of throat cancer  HISTORY OF PRESENT ILLNESS::Cathy Knight is a 70 y.o. female who presented with five-month history of right-sided otalgia and associated throat pain.  Initially she was treated with antibiotics and steroids without improvement.  CT scan of the neck was performed on 05/28/2020 showing a right tonsillar mass and nodular mucosal thickening of the soft palate with prominent right level 2 lymph nodes  Subsequently, the patient saw Dr. Vallarie Mare, who noted a small lesion to the soft palate. Biopsy of the right palate lesion at that time revealed three small fragments of benign squamous epithelium  with hyperkeratosis and scant benign submucosa with stromal hemosiderin. There was no dysplasia or malignancy identified.  Biopsy of the right throat on 06/11/2020 revealed: fragments of keratinizing well differentiated squamous cell carcinoma. There was also a single focus of submucosa with invasive carcinoma present. The p16 immunostain was positive. In-situ study for HPV 16/18 was negative. Broad HR HPV ISH study was negative.  Have personally reviewed her imaging Pertinent imaging thus far includes: 1. CT scan of neck on 05/28/2020 that showed an enhancing right tonsillar mass that was concerning for primary oropharyngeal cancer. There was also noted to be nodular mucosal thickening of the soft palate that was suspicious for synchronous tumor versus spread of the oral pharyngeal cancer. Additionally, there were prominent right level 2 lymph nodes with suspicious morphology that was concerning for nodal metastasis. 2. Maxillofacial CT scan on 05/28/2020 that did not identify a mass in the hard palate. 3. PET scan on 06/18/2020 that showed intense update in the right palatine tonsil with bilateral level 2A and right level 4 nodes, possibly reflecting spread of disease.(To my eye the hypermetabolic activity in the nodes is faint)   Nutrition Status Yes No Comments  Weight changes? _0  _1  Lost a few pounds  Swallowing concerns? _2  _3  pain  PEG? _4  _5     Referrals Yes No Comments  Social Work? _6  _7    Dentistry? _8  _9    Swallowing therapy? _10  _11    Nutrition? _12  _13    Med/Onc? _14  _15  Dr. Arletha Pili Iruku   Safety Issues Yes No Comments  Prior radiation? _16  _17    Pacemaker/ICD? [  x] _0    Possible current pregnancy? _1  _2  No--postmenopausal  Is the patient on methotrexate? _3  _4     Tobacco/Marijuana/Snuff/ETOH use: Current every day smoker (smokes about 1 pack/day, which is down from previous history of 2 packs/day). Denies any smokeless tobacco or recreational drug use. 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 Neysa Hotter [facility]) Cup forceps biopsy of the right tonsil  Past/Anticipated interventions by medical oncology, if any:  Met with Dr. Arletha Pili Iruku this morning  Current Complaints / other details: Patient has received the first 2 Moderna vaccines as well as a [place] booster Patient and significant other discussed some issues that might come. Patient continues to smoke but will try to quit. Discussed ways to quit smoking.  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  She has tolerated Wellbutrin in the past and wishes to receive at prescription for that.  She already has nicotine patches but has not started them  She recently lost a pet and this was a very difficult loss.  She is here with her significant other.  She was seen by an oncologic team at [facility] and asked for a referral to [facility] so that she can get treatment closer to home.   PREVIOUS RADIATION THERAPY: No  PAST MEDICAL HISTORY:  has no past medical history on file.    PAST SURGICAL HISTORY:History reviewed. No pertinent surgical history.  FAMILY HISTORY: family history is not on file.  SOCIAL HISTORY:  reports that she has been smoking cigarettes. She has a 15.00 pack-year smoking history. She does not have any smokeless tobacco history on file.  ALLERGIES: Atorvastatin  MEDICATIONS:  Current Outpatient Medications  Medication Sig Dispense Refill  . albuterol (PROVENTIL) (2.5 MG/3ML) 0.083% nebulizer solution     . alendronate (FOSAMAX) 70 MG tablet     . amLODipine (NORVASC) 5 MG tablet Take 1 tablet by mouth daily.    Marland Kitchen aspirin 81 MG chewable tablet Chew by mouth.    Marland Kitchen buPROPion (WELLBUTRIN SR) 150 MG 12 hr tablet Start one week before quit date. Take 1 tab daily x 3 days, then 1 tab BID thereafter. 60 tablet 2  . calcium  citrate-vitamin D (CITRACAL+D) 315-200 MG-UNIT tablet Take 1 tablet by mouth 2 (two) times daily.    . carvedilol (COREG) 6.25 MG tablet Take by mouth.    . Cholecalciferol (VITAMIN D3) 125 MCG (5000 UT) CAPS Take by mouth.    . co-enzyme Q-10 30 MG capsule Take 30 mg by mouth 3 (three) times daily.    . cyclobenzaprine (FLEXERIL) 10 MG tablet 10 mg Three (3) times a day as needed for muscle spasms.    . Evolocumab (REPATHA SURECLICK) 503 MG/ML SOAJ Inject into the skin.    . Flaxseed, Linseed, (FLAXSEED OIL) 1000 MG CAPS Take by mouth.    Marland Kitchen lisinopril (ZESTRIL) 20 MG tablet Take 1 tablet by mouth daily.    . Multiple Vitamins-Minerals (MULTIVITAMIN WOMEN 50+ PO) Take by mouth.    . naproxen (NAPROSYN) 500 MG tablet Take by mouth.    . nicotine (NICODERM CQ - DOSED IN MG/24 HOURS) 21 mg/24hr patch Place onto the skin.    Marland Kitchen omeprazole (PRILOSEC) 20 MG capsule Take 1 capsule by mouth daily.    . potassium chloride (KLOR-CON) 10 MEQ tablet Take 10 mEq by mouth daily.    Marland Kitchen  predniSONE (DELTASONE) 20 MG tablet Take by mouth as needed.    . sharps container Use as directed for sharps disposal    . SPIRIVA RESPIMAT 2.5 MCG/ACT AERS SMARTSIG:2 Puff(s) By Mouth Daily    . vitamin B-12 (CYANOCOBALAMIN) 1000 MCG tablet Take 1,000 mcg by mouth daily.    . vitamin B-12 (CYANOCOBALAMIN) 500 MCG tablet Take 500 mcg by mouth daily.    Marland Kitchen oxyCODONE 10 MG TABS Take 1 tablet (10 mg total) by mouth once as needed for severe pain (cancer pain). 30 tablet 0   No current facility-administered medications for this encounter.    REVIEW OF SYSTEMS:  Notable for that above.   PHYSICAL EXAM:      Vitals:   07/13/20 1125  BP: (!) 162/75  Pulse: 77  Resp: 17  Temp: 97.8 F (36.6 C)  SpO2: 99%      Filed Weights   07/13/20 1125  Weight: 162 lb 14.4 oz (73.9 kg)    General: Alert and oriented, in no acute distress HEENT: Head is normocephalic. Extraocular movements are intact. Oropharynx is notable for  endophytic tumor in right tonsillar fossa, approx 2cm.  There is a plaque like tan benign lesion, less than a centimeter, on the right hard palate; suboptimal condition of dentition Neck: Neck is notable for no palpable adenopathy bilaterally Heart: Regular in rate and rhythm with no murmurs, rubs, or gallops. Chest: Clear to auscultation bilaterally, with no rhonchi, wheezes, or rales. Abdomen: Soft, nontender, nondistended, with no rigidity or guarding. Extremities: No cyanosis or edema. Lymphatics: see Neck Exam Neurologic: Cranial nerves II through XII are grossly intact. No obvious focalities. Speech is fluent. Coordination is intact. Psychiatric: Judgment and insight are intact. Affect is appropriate.   ECOG = 1  0 - Asymptomatic (Fully active, able to carry on all predisease activities without restriction)  1 - Symptomatic but completely ambulatory (Restricted in physically strenuous activity but ambulatory and able to carry out work of a light or sedentary nature. For example, light housework, office work)  2 - Symptomatic, <50% in bed during the day (Ambulatory and capable of all self care but unable to carry out any work activities. Up and about more than 50% of waking hours)  3 - Symptomatic, >50% in bed, but not bedbound (Capable of only limited self-care, confined to bed or chair 50% or more of waking hours)  4 - Bedbound (Completely disabled. Cannot carry on any self-care. Totally confined to bed or chair)  5 - Death   Eustace Pen MM, Creech RH, Tormey DC, et al. [phone]). "Toxicity and response criteria of the [facility]". [place] Oncol. 5 (6): 649-55   LABORATORY DATA:  Lab Results  Component Value Date   WBC 9.1 07/13/2020   HGB 13.9 07/13/2020   HCT 40.8 07/13/2020   MCV 94.4 07/13/2020   PLT 289 07/13/2020   CMP     Component Value Date/Time   NA 141 07/13/2020 1239   K 3.9 07/13/2020 1239   CL 104 07/13/2020 1239   CO2 28 07/13/2020  1239   GLUCOSE 90 07/13/2020 1239   BUN 10 07/13/2020 1239   CREATININE 0.85 07/13/2020 1239   CALCIUM 9.5 07/13/2020 1239   PROT 6.6 07/13/2020 1239   ALBUMIN 3.8 07/13/2020 1239   AST 14 (L) 07/13/2020 1239   ALT 8 07/13/2020 1239   ALKPHOS 55 07/13/2020 1239   BILITOT 0.3 07/13/2020 1239   GFRNONAA >60 07/13/2020 1239  No results found for: TSH   RADIOGRAPHY:    As above    IMPRESSION/PLAN:  This is a delightful patient with head and neck cancer. I recommend radiotherapy for this patient.  She expects concurrent chemotherapy.  She will be discussed tomorrow at tumor board  We discussed the potential risks, benefits, and side effects of radiotherapy. We talked in detail about acute and late effects. We discussed that some of the most bothersome acute effects may be mucositis, dysgeusia, salivary changes, skin irritation, hair loss, dehydration, weight loss and fatigue. We talked about late effects which include but are not necessarily limited to dysphagia, hypothyroidism, nerve injury, vascular injury, spinal cord injury, xerostomia, trismus, neck edema, and potential injury to any of the tissues in the head and neck region. No guarantees of treatment were given. A consent form was signed and placed in the patient's medical record. The patient is enthusiastic about proceeding with treatment. I look forward to participating in the patient's care.    Simulation (treatment planning) will take place after she is cleared by dentistry  We also discussed that the treatment of head and neck cancer is a multidisciplinary process to maximize treatment outcomes and quality of life. For this reason the following referrals have been or will be made:   Medical oncology to discuss chemotherapy    Dentistry for dental evaluation, possible extractions in the radiation fields, and /or advice on reducing risk of cavities, osteoradionecrosis, or other oral issues.  I have sent dose estimates to Dr.  Benson Norway   Nutritionist for nutrition support during and after treatment.   Speech language pathology for swallowing and/or speech therapy.   Social work for social support.    Physical therapy due to risk of lymphedema in neck and deconditioning.   Baseline labs including TSH.  I asked the patient today about tobacco use. The patient uses tobacco.  I advised the patient to quit. Services were offered by me today including outpatient counseling and pharmacotherapy. I assessed for the willingness to attempt to quit and provided encouragement and demonstrated willingness to make referrals and/or prescriptions to help the patient attempt to quit. The patient has follow-up with the oncologic team to touch base on their tobacco use and /or cessation efforts.  Over 3 minutes were spent on this issue.  She has nicotine patches.  She would like to start Wellbutrin.  We discussed that there may be a slight risk of seizures given her seizure history, though since the seizures were related to alcohol withdrawal - it is reasonably safe to take Wellbutrin and I believe the benefits outweigh the risks.  She has tolerated it in the past.  Prescription has been made and patient instructed on how to take this.  On date of service, in total, I spent 65 minutes on this encounter. Patient was seen in person.  __________________________________________   Eppie Gibson, MD  This document serves as a record of services personally performed by Eppie Gibson, MD. It was created on his behalf by Clerance Lav, a trained medical scribe. The creation of this record is based on the scribe's personal observations and the provider's statements to them. This document has been checked and approved by the attending provider.

## 2020-07-13 NOTE — Progress Notes (Signed)
Dental Form with Estimates of Radiation Dose      Diagnosis:    ICD-10-CM   1. Screening for hypothyroidism  Z13.29 TSH  2. 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 buPROPion (WELLBUTRIN SR) 150 MG 12 hr tablet  3. Loss of weight  R63.4 TSH    Prognosis: curative  Anticipated # of fractions: 35    Daily?: yes  # of weeks of radiotherapy: 7  Chemotherapy?: yes  Anticipated xerostomia:  Mild permanent   Pre-simulation needs:  Scatter protection   Simulation: ASAP   Other Notes: Dose will get close to or overlap roots on RIGHT  Please contact Eppie Gibson, MD, with patient's disposition after evaluation and/or dental treatment.

## 2020-07-13 NOTE — Progress Notes (Signed)
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  She had persistent sore throat and right-sided ear pain which warranted further investigation.  Imaging showed intense uptake in the right tonsil suspicious for malignancy, PET/CT showed uptake in the right palatine tonsil with bilateral level 2A and right level 4 nodes reflecting spread of disease. No evidence of distant metastasis.  Given the bilateral lymphadenopathy, I do agree that chemoradiation would be the best option to proceed with.  We have discussed the curative intent of treatment.  I have discussed about adverse effects of cisplatin including fatigue, nausea, vomiting, myelosuppression, ototoxicity, nephrotoxicity.  She understands that some of the side effects can be permanent.  Hair loss is not usually expected with cisplatin. We have discussed that concurrent therapy can also cause severe mucositis and can limit nourishment and hence we usually recommend a G-tube for nourishment during the treatment.  She is agreeable with this. We have discussed about port for chemotherapy administration since she is a hard stick. She is agreeable to this. We will make a referral to nutrition for discussion.  She has not lost much weight but she thinks she is starting to lose some weight in the past week since she has lost appetite. Smoking cessation strongly encouraged.  Cancer related pain She has severe pain in the right neck radiating to the right ear especially with certain movement related to the primary oropharyngeal cancer.  She is currently taking oxycodone about 2 tablets once a day.   I have clearly recommended that she does not take tramadol while she is taking oxycodone.  She acknowledged it. Medication has been refilled today to the pharmacy of her choice.  We have discussed that concurrent use of tramadol and oxycodone can lead to synergistic side effects which can occasionally be fatal.  Orders Placed This Encounter  Procedures  . IR IMAGING GUIDED PORT INSERTION    Standing Status:   Future    Standing Expiration Date:   07/13/2021    Order Specific Question:   Reason for Exam (SYMPTOM  OR DIAGNOSIS REQUIRED)    Answer:   Chemotherapy administration    Order Specific Question:   Preferred Imaging Location?    Answer:   [facility]  . IR Gastrostomy Tube    Standing Status:   Future    Standing Expiration Date:   07/13/2021    Order Specific Question:   Reason for exam:    Answer:   Concurrent CRT for oropharyngeal cancer.    Order Specific Question:   Preferred Imaging Location?    Answer:   [facility]  . CBC with Differential/Platelet    Standing Status:   Standing    Number of Occurrences:   22    Standing Expiration Date:   07/13/2021  . CMP ([place] only)    Standing Status:   Future    Number of Occurrences:   1    Standing Expiration Date:   07/13/2021  . Ambulatory Referral to [facility] Nutrition    Referral Priority:   Routine    Referral Type:   Consultation    Referral Reason:   Specialty Services Required  Requested Specialty:   Oncology    Number of Visits Requested:   1     HISTORY OF PRESENTING ILLNESS:  Cathy Knight 70 y.o. female is here because of SCC oropharynx, P 16 +  Chronology  This is a very pleasant 70 year old female patient with newly diagnosed oropharyngeal cancer referred to medical oncology for recommendations.  Patient first presented in February with chief complaint of right-sided ear pain and associated throat pain, symptoms have been going on for several months prior to presentation.  She had  antibiotics, steroids and did not see much improvement.  She then had a CT neck which showed a mass in the right tonsil suspicious for malignancy which triggered further work-up.  She had PET/CT later which showed intense uptake in the right palatine tonsil with bilateral level 2A and right level 4 nodes reflecting spread of disease.  There is no clear evidence of metastatic disease.  She had biopsy of the right throat which showed keratinizing well-differentiated squamous cell carcinoma, p16 immunostain is positive  There was also another biopsy from the right palate which showed benign squamous epithelium with hyperkeratosis, no dysplasia or malignancy.  She smokes about 1 pack a day, she used to smoke about 2 packs a day in the past.  Today she complains of pain in the right neck radiating to the right ear.  She tells me that the pain is quite excruciating when it happens and it happens mostly on movement, trying to eat or swallowing.  She has been taking 2 oxycodones a day for pain management.  She apparently has been on long-term tramadol for her back pain and the oxycodone does not help the back pain.  She however is very careful about not taking any tramadol on the day she takes oxycodone.  She has been working on cutting down her smoking.  She denies any other complaints except for the pain in the right side of the neck radiating to the ear today.  She does have some chronic tinnitus but no baseline hearing loss.  No known kidney issues. Rest of the pertinent 10 point ROS reviewed and negative.  REVIEW OF SYSTEMS:   Constitutional: Denies fevers, chills or abnormal night sweats Eyes: Denies blurriness of vision, double vision or watery eyes Ears, nose, mouth, throat, and face: Denies mucositis or sore throat Respiratory: Denies cough, dyspnea or wheezes Cardiovascular: Denies palpitation, chest discomfort or lower extremity swelling Gastrointestinal:  Denies nausea, heartburn or change in bowel  habits Skin: Denies abnormal skin rashes Lymphatics: Denies new lymphadenopathy or easy bruising Neurological:Denies numbness, tingling or new weaknesses Behavioral/Psych: Mood is stable, no new changes  All other systems were reviewed with the patient and are negative.  MEDICAL HISTORY:  No past medical history on file.  SURGICAL HISTORY: No past surgical history on file.  SOCIAL HISTORY: Social History   Socioeconomic History  . Marital status: Single    Spouse name: Not on file  . Number of children: Not on file  . Years of education: Not on file  . Highest education level: Not on file  Occupational History  . Not on file  Tobacco Use  . Smoking status: Current Every Day Smoker    Packs/day: 1.00    Years: 15.00    Pack years: 15.00    Types: Cigarettes  . Smokeless tobacco: Not on file  Substance and Sexual Activity  . Alcohol use: Not on file  . Drug use: Not on file  . Sexual activity: Not  on file  Other Topics Concern  . Not on file  Social History Narrative  . Not on file   Social Determinants of Health   Financial Resource Strain: Not on file  Food Insecurity: Not on file  Transportation Needs: Not on file  Physical Activity: Not on file  Stress: Not on file  Social Connections: Not on file  Intimate Partner Violence: Not on file    FAMILY HISTORY: No family history on file.  ALLERGIES:  is allergic to atorvastatin.  MEDICATIONS:  Current Outpatient Medications  Medication Sig Dispense Refill  . albuterol (PROVENTIL) (2.5 MG/3ML) 0.083% nebulizer solution     . alendronate (FOSAMAX) 70 MG tablet     . amLODipine (NORVASC) 5 MG tablet Take 1 tablet by mouth daily.    Marland Kitchen aspirin 81 MG chewable tablet Chew by mouth.    . calcium citrate-vitamin D (CITRACAL+D) 315-200 MG-UNIT tablet Take 1 tablet by mouth 2 (two) times daily.    . carvedilol (COREG) 6.25 MG tablet Take by mouth.    . Cholecalciferol (VITAMIN D3) 125 MCG (5000 UT) CAPS Take by  mouth.    . co-enzyme Q-10 30 MG capsule Take 30 mg by mouth 3 (three) times daily.    . cyclobenzaprine (FLEXERIL) 10 MG tablet 10 mg Three (3) times a day as needed for muscle spasms.    . Evolocumab (REPATHA SURECLICK) 412 MG/ML SOAJ Inject into the skin.    . Flaxseed, Linseed, (FLAXSEED OIL) 1000 MG CAPS Take by mouth.    Marland Kitchen lisinopril (ZESTRIL) 20 MG tablet Take 1 tablet by mouth daily.    . Multiple Vitamins-Minerals (MULTIVITAMIN WOMEN 50+ PO) Take by mouth.    . naproxen (NAPROSYN) 500 MG tablet Take by mouth.    . nicotine (NICODERM CQ - DOSED IN MG/24 HOURS) 21 mg/24hr patch Place onto the skin.    Marland Kitchen omeprazole (PRILOSEC) 20 MG capsule Take 1 capsule by mouth daily.    . potassium chloride (KLOR-CON) 10 MEQ tablet Take 10 mEq by mouth daily.    . predniSONE (DELTASONE) 20 MG tablet Take by mouth as needed.    . sharps container Use as directed for sharps disposal    . SPIRIVA RESPIMAT 2.5 MCG/ACT AERS SMARTSIG:2 Puff(s) By Mouth Daily    . vitamin B-12 (CYANOCOBALAMIN) 1000 MCG tablet Take 1,000 mcg by mouth daily.    . vitamin B-12 (CYANOCOBALAMIN) 500 MCG tablet Take 500 mcg by mouth daily.    Marland Kitchen oxyCODONE 10 MG TABS Take 1 tablet (10 mg total) by mouth once as needed for severe pain (cancer pain). 30 tablet 0   No current facility-administered medications for this visit.    PHYSICAL EXAMINATION:  ECOG PERFORMANCE STATUS: 1 - Symptomatic but completely ambulatory  Vitals:   07/13/20 1125  BP: (!) 162/75  Pulse: 77  Resp: 17  Temp: 97.8 F (36.6 C)  SpO2: 99%   Filed Weights   07/13/20 1125  Weight: 162 lb 14.4 oz (73.9 kg)    GENERAL:alert, no distress and comfortable SKIN: skin color, texture, turgor are normal, no rashes or significant lesions EYES: normal, conjunctiva are pink and non-injected, sclera clear OROPHARYNX:no exudate, no erythema and lips, buccal mucosa, and tongue normal  NECK: supple, thyroid normal size, non-tender, without nodularity LYMPH:  Palpable fullness in the right neck, no palpable lymphadenopathy on the left neck.  Tonsil mass not visible on oral exam.  LUNGS: clear to auscultation and percussion with normal breathing effort HEART: regular rate &  rhythm and no murmurs and no lower extremity edema ABDOMEN:abdomen soft, non-tender and normal bowel sounds Musculoskeletal:no cyanosis of digits and no clubbing  PSYCH: alert & oriented x 3 with fluent speech NEURO: no focal motor/sensory deficits  LABORATORY DATA:  I have reviewed the data as listed Lab Results  Component Value Date   WBC 9.1 07/13/2020   HGB 13.9 07/13/2020   HCT 40.8 07/13/2020   MCV 94.4 07/13/2020   PLT 289 07/13/2020     Chemistry      Component Value Date/Time   NA 141 07/13/2020 1239   K 3.9 07/13/2020 1239   CL 104 07/13/2020 1239   CO2 28 07/13/2020 1239   BUN 10 07/13/2020 1239   CREATININE 0.85 07/13/2020 1239      Component Value Date/Time   CALCIUM 9.5 07/13/2020 1239   ALKPHOS 55 07/13/2020 1239   AST 14 (L) 07/13/2020 1239   ALT 8 07/13/2020 1239   BILITOT 0.3 07/13/2020 1239     I have reviewed pertinent imaging and pathology reports.  A: Palate lesion, right, biopsy - Three small fragments of benign squamous epithelium with hyperkeratosis (see comment) - Scant benign submucosa with stromal hemosiderin - No dysplasia or malignancy identified   In situ study for HPV 16/18 is negative. Slides will ne sent to [facility] for a broader HR HPV panel  Addendum electronically signed by Isac Caddy Askin, MD on 06/18/2020 at 5:04 PM  Addendum    The p16 immunostain is positive. HR HPV study ordered.  Addendum electronically signed by Isac Caddy Askin, MD on 06/18/2020 at 1:57 PM  Diagnosis    A: Throat, right, biopsy - Fragments of keratinizing well differentiated squamous carcinoma.  A single focus of submucosa with invasive carcinoma is present   RADIOGRAPHIC STUDIES: I have personally reviewed the radiological  images as listed and agreed with the findings in the report. No results found.  All questions were answered. The patient knows to call the clinic with any problems, questions or concerns. I have spent 60 minutes in the care of this patient including reviewing records, imaging, pathology reports, discussion about diagnosis of cancer, treatment options, adverse effects, prognosis etc.    Benay Pike, MD 07/13/2020 1:23 PM

## 2020-07-19 ENCOUNTER — Encounter (HOSPITAL_COMMUNITY): Payer: Self-pay | Admitting: Dentistry

## 2020-07-19 ENCOUNTER — Other Ambulatory Visit: Payer: Self-pay

## 2020-07-19 ENCOUNTER — Ambulatory Visit (INDEPENDENT_AMBULATORY_CARE_PROVIDER_SITE_OTHER): Payer: Self-pay | Admitting: Dentistry

## 2020-07-19 DIAGNOSIS — K029 Dental caries, unspecified: Secondary | ICD-10-CM

## 2020-07-19 DIAGNOSIS — K030 Excessive attrition of teeth: Secondary | ICD-10-CM

## 2020-07-19 DIAGNOSIS — Z01818 Encounter for other preprocedural examination: Secondary | ICD-10-CM

## 2020-07-19 DIAGNOSIS — K06020 Generalized gingival recession, unspecified: Secondary | ICD-10-CM

## 2020-07-19 DIAGNOSIS — C090 Malignant neoplasm of tonsillar fossa: Secondary | ICD-10-CM

## 2020-07-19 DIAGNOSIS — K036 Deposits [accretions] on teeth: Secondary | ICD-10-CM

## 2020-07-19 DIAGNOSIS — K08109 Complete loss of teeth, unspecified cause, unspecified class: Secondary | ICD-10-CM

## 2020-07-19 DIAGNOSIS — K032 Erosion of teeth: Secondary | ICD-10-CM

## 2020-07-19 DIAGNOSIS — F40232 Fear of other medical care: Secondary | ICD-10-CM

## 2020-07-19 DIAGNOSIS — K0530 Chronic periodontitis, unspecified: Secondary | ICD-10-CM

## 2020-07-19 DIAGNOSIS — K031 Abrasion of teeth: Secondary | ICD-10-CM

## 2020-07-19 NOTE — Patient Instructions (Signed)
[place], D.M.D. Phone: [phone] Fax: [phone]   It was a pleasure seeing you today!  Please refer to the information below regarding your dental visit with [place].  Call if you have any questions or concerns that come up after you leave.   Thank you for letting us provide care for you.  If there is anything we can do for you, please let us know.    RADIATION THERAPY AND INFORMATION REGARDING YOUR TEETH   . XEROSTOMIA (DRY MOUTH):  Your salivary glands may be in the field of radiation.  Radiation may include all or only part of your salivary glands.  This will cause your saliva to dry up, and you will have a dry mouth.  The dry mouth will be for the rest of your life unless your radiation oncologist tells you otherwise.  Your saliva has many functions: 1. It wets your tongue for speaking. 2. It coats your teeth and the inside of your mouth for easier movement. 3. It helps with chewing and swallowing food. 4. It helps clean away harmful acid and toxic products made by the germs in your mouth, therefore it helps prevent cavities. 5. It kills some germs in your mouth and helps to prevent gum disease. 6. It helps to carry flavor to your taste buds.  Once you have lost your saliva, you will be at higher risk for tooth decay and gum disease.    What can be done to help improve your mouth when there's not enough saliva? Marland Kitchen Your dentist may give a recommendation for CLoSYS.  It will not bring back all of your saliva but may bring back some of it.  Also, your saliva may be thick and ropy or white and foamy.  It will not feel like it use to feel. . You will need to swish with water every time your mouth feels dry.  YOU CANNOT suck on any cough drops, mints, lemon drops, candy, vitamin C or any other products.  You cannot use anything other than water to make your mouth feel less dry.  If you want to drink anything else, you have to drink it  all at once and brush afterwards.  Be sure to discuss the details of your diet habits with your dentist or hygienist.   . RADIATION CARIES:  This is decay (cavities) that happens very quickly once your mouth is very dry due to radiation therapy.  Normally, cavities take six months to two years to become a problem.  When you have dry mouth, cavities may take as little as eight weeks to cause you a problem.    o Dental check-ups every two months are necessary as long as you have a dry mouth. Radiation caries typically, but not always, start at your gum line where it is hard to see the cavity.  It is therefore also hard to fill these cavities adequately.  This high rate of cavities happens because your mouth no longer has saliva and therefore the acid made by the germs starts the decay process.  Whenever you eat anything the germs in your mouth change the food into acid.  The acid then burns a small hole in your tooth.  This small hole is the beginning of a cavity.  If this is not treated then it will grow bigger and become a cavity.  The way to avoid this hole getting bigger is to use fluoride every evening as prescribed by your  dentist following your radiation. o NOTE:  You have to make sure that your teeth are very clean before you use the fluoride.  This fluoride in turn will strengthen your teeth and prepare them for another day of fighting acid. o If you develop radiation caries many times, the damage is so large that you will have to have all your teeth removed.  This could be a big problem if some of these teeth are in the field of radiation.  Further details of why this could be a big problem will follow (see Osteoradionecrosis below).   . DYSGEUSIA (LOSS OF TASTE):  This happens to varying degrees once you've had radiation therapy to your jaw region.  Many times taste is not completely lost, but becomes limited.  The loss of taste is mostly due to radiation affecting your taste buds.  However, if  you have no saliva in your mouth to carry the flavor to your taste buds, it would be difficult for your taste buds to taste anything.  That is why using water or a prescription for [place] prior to meals and during meal times may help with some of the taste.  Keep in mind that taste generally returns very slowly over the course of several months or several years after radiation therapy.  Don't give up hope.   . TRISMUS (LIMITED JAW OPENING):  According to your radiation oncologist, your TMJ or jaw joints are going to be partially or fully in the field of radiation.  This means that over time the muscles that help you open and close your mouth may get stiff.  This will potentially result in your not being able to open your mouth wide enough or as wide as you can open it now.    Let me give you an example of how slowly this happens and how unaware people are of it:   . A gentlemen that had radiation therapy two years ago came back to me complaining that bananas are just too large for him to be able to fit them in between his teeth.  He was not able to open wide enough to bite into a banana.  This happens slowly and over a period of time.  What we do to try and prevent this:   1. Your dentist will probably give you a stack of sticks called a trismus exercise device.  This stack will help remind your muscles and your jaw joints to open up to the same distance every day.  Use these sticks every morning when you wake up, or according to the instructions given by your dentist.    2. You must use these sticks for at least one to two years after radiation therapy.  The reason for that is because it happens so slowly and keeps going on for about two years after radiation therapy.  Your hospital dentist will help you monitor your mouth opening and make sure that it's not getting smaller after radiation.  TRISMUS EXERCISES: 1. Using the stack of sticks given to you by your dentist, place the stack in your mouth and  hold onto the other end for support. 2. Leave the sticks in your mouth while holding the other end.  Allow 30 seconds for muscle stretching. 3. Rest for a few seconds. 4. Repeat 3-5 times. 5. This exercise is recommended in the mornings and evenings unless otherwise instructed. 6. The exercise should be done for a period of 2 YEARS after the end of radiation. Marland Kitchen  Your maximum jaw opening should be checked regularly at recall dental visits by your general dentist. . You should report any changes, soreness, or difficulties encountered when doing the exercises to your dentist.   . OSTEORADIONECROSIS (ORN):  This is a condition where your jaw bone after radiation therapy becomes very dry.  It has very little blood supply to keep it alive.  If you develop a cavity that turns into an abscess or an infection, then the jaw bone does not have enough blood supply to help fight the infection.  At this point it is very likely that the infection could cause the death of your jaw bone.  When you have dead bone it has to be removed.  Therefore, you might end up having to have surgery to remove part of your jaw bone, the part of the jaw bone that has been affected.     o Healing is also a problem if you are to have surgery (like a tooth extraction) in the areas where the bone has had radiation therapy.  If you have surgery, you need more blood supply to heal which is not available.  When blood supply and oxygen are not available, there is a chance for the bone to die. o Occasionally, ORN happens on its own with no obvious reason, but this is quite rare.  We believe that patients who continue to smoke and/or drink alcohol have a higher chance of having this problem. o Once your jaw bone has had radiation therapy, if there are any remaining teeth in that area, it is not recommended to have them pulled unless your dentist or oral surgeon is aware of your history of radiation and believes it is safe.  o The risks for ORN  either from infection or spontaneously occurring (with no reason) are life long.   QUESTIONS?  Call our office during office hours at [phone].

## 2020-07-19 NOTE — Progress Notes (Signed)
Department of Dental Medicine     OUTPATIENT CONSULTATION  Service Date:   07/19/2020  Patient Name:   Cathy Knight Date of Birth:   Dec 09, 1950 Medical Record Number: [id number]  Referring Provider:               Eppie Gibson, MD  TODAY'S VISIT   Assessment:   . There are no current signs of acute odontogenic infection including abscess, edema or erythema, or suspicious lesion requiring biopsy.   . The patient does have several teeth with caries and periodontal concerns which will need to be addressed following radiation to decrease risk of postoperative pain/infection.   Recommendations:   . No dental intervention indicated prior to starting radiation therapy at this time. Plan:   . Discuss case with medical team and coordinate treatment as needed. . Follow-up after the patient finishes radiation therapy. . Discussed in detail all treatment options and recommendations with the patient and they are agreeable to the plan.    Thank you for consulting with [facility] and for the opportunity to participate in this patient's treatment.  Should you have any questions or concerns, please contact the [facility] at [phone].   PROGRESS NOTE:   COVID-19 SCREENING:  The patient denies symptoms concerning for COVID-19 infection including fever, chills, cough, or newly developed shortness of breath.   HISTORY OF PRESENT ILLNESS: . Cathy Knight is a very pleasant 70 y.o. 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  The patient presents today for a medically necessary dental consultation as part of their pre-radiation work-up.   DENTAL HISTORY: . The patient reports that she does not have a regular dentist.  The last time she went to a dentist was about 6 years ago to have a tooth extracted.  She currently denies any dental/orofacial  pain or sensitivity, however she does have some intermittent pain/sensitivity on the top left when she bites down. . Patient is able to manage oral secretions.  Patient denies fever, rigors and malaise.   CHIEF COMPLAINT:  . Here for a pre-radiation dental exam.   Patient Active Problem List   Diagnosis Date Noted  . 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 Cancer related pain 07/13/2020  . Cancer of tonsillar fossa ([place]) 07/13/2020  . Oropharynx cancer ([place]) 06/29/2020  . COPD (chronic obstructive pulmonary disease) ([place]) 04/24/2017  . Status epilepticus ([place]) 04/03/2017  . Mammogram declined 04/24/2016  . Essential hypertension 07/20/2015  . Atherosclerosis of native artery of extremity ([place]) 09/29/2011  . Mixed hyperlipidemia 09/29/2011  . Tobacco use disorder 09/29/2011   History reviewed. No pertinent past medical history. History reviewed. No pertinent surgical history. Allergies  Allergen Reactions  . Atorvastatin Other (See Comments)    Muscle pain    Current Outpatient Medications  Medication Sig Dispense Refill  . albuterol (PROVENTIL) (2.5 MG/3ML) 0.083% nebulizer solution     . alendronate (FOSAMAX) 70 MG tablet     . amLODipine (NORVASC) 5 MG tablet Take 1 tablet by mouth daily.    Marland Kitchen aspirin 81 MG chewable tablet Chew by mouth.    Marland Kitchen buPROPion (WELLBUTRIN SR) 150 MG 12 hr tablet Start one week before quit date. Take 1 tab daily x 3 days, then 1 tab BID thereafter. 60 tablet 2  . calcium citrate-vitamin D (CITRACAL+D) 315-200 MG-UNIT tablet Take 1 tablet by mouth 2 (two) times  daily.    . carvedilol (COREG) 6.25 MG tablet Take by mouth.    . Cholecalciferol (VITAMIN D3) 125 MCG (5000 UT) CAPS Take by mouth.    . co-enzyme Q-10 30 MG capsule Take 30 mg by mouth 3 (three) times daily.    . cyclobenzaprine (FLEXERIL) 10 MG tablet 10 mg Three (3) times a day as needed for muscle spasms.    . Evolocumab (REPATHA SURECLICK) 250 MG/ML SOAJ Inject into the  skin.    . Flaxseed, Linseed, (FLAXSEED OIL) 1000 MG CAPS Take by mouth.    Marland Kitchen lisinopril (ZESTRIL) 20 MG tablet Take 1 tablet by mouth daily.    . Multiple Vitamins-Minerals (MULTIVITAMIN WOMEN 50+ PO) Take by mouth.    . naproxen (NAPROSYN) 500 MG tablet Take by mouth.    . nicotine (NICODERM CQ - DOSED IN MG/24 HOURS) 21 mg/24hr patch Place onto the skin.    Marland Kitchen omeprazole (PRILOSEC) 20 MG capsule Take 1 capsule by mouth daily.    Marland Kitchen oxyCODONE 10 MG TABS Take 1 tablet (10 mg total) by mouth once as needed for severe pain (cancer pain). 30 tablet 0  . potassium chloride (KLOR-CON) 10 MEQ tablet Take 10 mEq by mouth daily.    . predniSONE (DELTASONE) 20 MG tablet Take by mouth as needed.    . sharps container Use as directed for sharps disposal    . SPIRIVA RESPIMAT 2.5 MCG/ACT AERS SMARTSIG:2 Puff(s) By Mouth Daily    . vitamin B-12 (CYANOCOBALAMIN) 1000 MCG tablet Take 1,000 mcg by mouth daily.    . vitamin B-12 (CYANOCOBALAMIN) 500 MCG tablet Take 500 mcg by mouth daily.     No current facility-administered medications for this visit.    LABS: Lab Results  Component Value Date   WBC 9.1 07/13/2020   HGB 13.9 07/13/2020   HCT 40.8 07/13/2020   MCV 94.4 07/13/2020   PLT 289 07/13/2020      Component Value Date/Time   NA 141 07/13/2020 1239   K 3.9 07/13/2020 1239   CL 104 07/13/2020 1239   CO2 28 07/13/2020 1239   GLUCOSE 90 07/13/2020 1239   BUN 10 07/13/2020 1239   CREATININE 0.85 07/13/2020 1239   CALCIUM 9.5 07/13/2020 1239   GFRNONAA >60 07/13/2020 1239   No results found for: INR, PROTIME No results found for: PTT  Social History   Socioeconomic History  . Marital status: Single    Spouse name: Not on file  . Number of children: Not on file  . Years of education: Not on file  . Highest education level: Not on file  Occupational History  . Not on file  Tobacco Use  . Smoking status: Current Every Day Smoker    Packs/day: 1.00    Years: 15.00    Pack years:  15.00    Types: Cigarettes  . Smokeless tobacco: Not on file  Substance and Sexual Activity  . Alcohol use: Not on file  . Drug use: Not on file  . Sexual activity: Not on file  Other Topics Concern  . Not on file  Social History Narrative  . Not on file   Social Determinants of Health   Financial Resource Strain: Not on file  Food Insecurity: Not on file  Transportation Needs: Not on file  Physical Activity: Not on file  Stress: Not on file  Social Connections: Not on file  Intimate Partner Violence: Not on file   History reviewed. No pertinent family history.  REVIEW OF SYSTEMS:  . Reviewed with the patient as per HPI. Psych:  (+) Dental phobia   VITAL SIGNS: BP (!) 153/76 (BP Location: Right Arm)   Pulse 73   Temp 98.3 F (36.8 C) (Oral)    PHYSICAL EXAM: General:  Well-developed, comfortable and in no apparent distress. Neurological:  Alert and oriented to person, place and  time. Extraoral:  Facial symmetry present without any edema or erythema.  No swelling or lymphadenopathy.  TMJ without clicks or crepitations. (+) Pain on opening Maximum Interincisal Opening:  44 mm Intraoral:  Soft tissues appear well-perfused and mucous membranes moist.  FOM and vestibules soft and not raised. No signs of infection, parulis, sinus tract, edema or erythema evident upon exam. (+) Right side of oropharynx appears enlarged and she reports pain in this area.   DENTAL EXAM:  . Hard tissue exam completed and charted. Overall impression:  Fair remaining dentition.  Missing teeth, caries, existing restorations, generalized heavy staining. Oral hygiene:  Fair    Periodontal:  Pink, healthy gingival tissue with blunted papilla.  Generalized plaque accumulation.  Gingival recession & reported sensitivity on some teeth.  Tooth #13 has questionable/poor periodontal prognosis. Caries:  Clinical caries charted. Defective restorations:  #18 and #19 have recurrent decay under existing  occlusal amalgam. Removable/fixed prosthodontics:  Patient denies wearing removable/fixed partial dentures. Occlusion:  Unable to assess molar occlusion.  Crowding of maxillary anterior teeth. Other findings:  Incisal attrition #7, #8, #9, #23, #24, #25 and occlusal attrition #18.  Abfraction(s): #5B(V), #12/#13B(V), #15B(V), #19/#20/#21B(V), #28/#29B(V) and #32B(V).  #31 has fractured mesiolingual cusp limited to enamel.     RADIOGRAPHIC EXAM:  . PAN and Full Mouth Series exposed and interpreted.  Condyles seated bilaterally in fossas.  No evidence of abnormal pathology.  All visualized osseous structures appear WNL.  Generalized moderate horizontal bone loss consistent with moderate periodontitis vs recession. #13 has severe mesial and distal vertical and horizontal bone loss with widened PDL. Missing teeth, multiple teeth with caries/decay- #19 with deep decay. Existing restorations.    ASSESSMENT:  1.  SCC of oropharynx 2.  Pre-radiation dental examination 3.  Caries 4.  Missing teeth 5.  Periodontitis 6.  Gingival recession 7.  Accretions on teeth 8.  Attrition/wear 9.  Abfraction/flexure 10.  Dental Phobia   PROCEDURES: . The common and significant side effects of radiation therapy to the head and neck were explained and discussed with the patient.  The discussion included side effects of trismus (limited opening), dysgeusia (loss of taste), xerostomia (dry mouth), radiation caries and osteoradionecrosis of the jaw.  I also discussed the importance of maintaining optimal oral hygiene and oral health before, during and after radiation to decrease the risk of developing radiation cavities and the need for any surgery such as extractions after therapy.    1. Upper and lower alginate impressions taken and poured up in Type IV Microstone for fabrication of fluoride trays to give to the patient after radiation.   2. Trismus appliance made using patient's baseline MIO (23 sticks).   Leta Speller, DAII demonstrated use of appliance.  Verbal and written postop instructions were given to the patient.   PLAN AND RECOMMENDATIONS: . I discussed the risks, benefits, and complications of various scenarios with the patient in relationship to their medical and dental conditions, which included osteoradionecrosis or other serious issues that could potentially occur either before, during or after their anticipated radiation therapy if dental/oral concerns are not addressed.  I explained that  if any chronic or acute dental/oral infection(s) are addressed and subsequently not maintained following medical optimization and recovery, their risk of the previously mentioned complications are just as high and could potentially occur postoperatively.  I explained all significant findings of the dental consultation with the patient including several teeth with cavities and generalized staining, multiple abfraction(s) on teeth and the recommended care including following up in our clinic after radiation and potentially becoming an established patient for routine care in order to optimize them from a dental standpoint.  Explained that there are no immediate concerns that need to necessarily be addressed prior to starting radiation, but concerns that will need to be addressed afterwards to avoid the complications and risks we previously discussed.  The patient verbalized understanding of all findings, discussion, and recommendations. . We then discussed various treatment options to include no treatment, multiple extractions with alveoloplasty, pre-prosthetic surgery as indicated, periodontal therapy, dental restorations, root canal therapy, crown and bridge therapy, implant therapy, and replacement of missing teeth as indicated.  The patient verbalized understanding of all options, and currently wishes to proceed with following-up in our clinic s/p radiation and may decide to become a routine patient for dental  care.  She has dental Medicaid so this option was presented to her today. . Plan to discuss all findings and recommendations with medical team and coordinate future care as needed.  o All questions and concerns were invited and addressed.  The patient tolerated today's visit well and departed in stable condition.   I spent in excess of 120 minutes during the conduct of this consultation and >50% of this time involved direct face-to-face encounter for counseling and/or coordination of the patient's care. [place] Benson [place], D.M.D.

## 2020-07-20 ENCOUNTER — Telehealth (HOSPITAL_COMMUNITY): Payer: Self-pay

## 2020-07-20 ENCOUNTER — Encounter: Payer: Self-pay | Admitting: Radiation Oncology

## 2020-07-20 NOTE — Telephone Encounter (Signed)
-----   Message from Arne Cleveland, MD sent at 07/20/2020  2:55 PM EDT ----- Regarding: RE: Peg/Port placement [employer] for both on same day Port 1st  DD[facility]   ----- Message ----- From: Danielle Dess Sent: 07/20/2020   2:42 PM EDT To: Ir Procedure Requests Subject: Peg/Port placement                             Procedure: Peg/Port Placement  Dx: Squamous cell carcinoma of oropharynx  Ordering: Dr. Benay Pike [phone]  Imaging: NM outside films body/abd/pelvis 06/30/20 uploaded into epic  Please review.   Thanks,  Lia Foyer

## 2020-07-20 NOTE — Progress Notes (Signed)
Updates:  Patient was discussed at tumor board last week.  Based on pathology review, patient's disease is considered p16 negative.  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  I will order CT simulation to take place in the near future.  -----------------------------------  Eppie Gibson, MD

## 2020-07-21 ENCOUNTER — Other Ambulatory Visit: Payer: Self-pay | Admitting: Hematology and Oncology

## 2020-07-21 DIAGNOSIS — C109 Malignant neoplasm of oropharynx, unspecified: Secondary | ICD-10-CM

## 2020-07-21 MED ORDER — DEXAMETHASONE 4 MG PO TABS: 8.0000 mg | ORAL_TABLET | Freq: Every day | ORAL | 1 refills | Status: DC

## 2020-07-21 MED ORDER — ONDANSETRON HCL 8 MG PO TABS: 8.0000 mg | ORAL_TABLET | Freq: Two times a day (BID) | ORAL | 1 refills | Status: DC | PRN

## 2020-07-21 MED ORDER — PROCHLORPERAZINE MALEATE 10 MG PO TABS: 10.0000 mg | ORAL_TABLET | Freq: Four times a day (QID) | ORAL | 1 refills | Status: DC | PRN

## 2020-07-21 MED ORDER — LIDOCAINE-PRILOCAINE 2.5-2.5 % EX CREA: TOPICAL_CREAM | CUTANEOUS | 3 refills | Status: DC

## 2020-07-21 NOTE — Progress Notes (Signed)
START ON PATHWAY REGIMEN - Head and Neck     A cycle is every 7 days:     Cisplatin   **Always confirm dose/schedule in your pharmacy ordering system**  Patient Characteristics: Oropharynx, HPV Negative/Unknown, Preoperative or Nonsurgical Candidate (Clinical Staging), Stage IVA/B Disease Classification: Oropharynx HPV Status: Negative (-) Therapeutic Status: Preoperative or Nonsurgical Candidate (Clinical Staging) AJCC T Category: cT2 AJCC 8 Stage Grouping: IVA AJCC N Category: cN2c AJCC M Category: cM0 Intent of Therapy: Curative Intent, Discussed with Patient

## 2020-07-22 MED FILL — REPATHA SURECLICK 140 MG/ML SUBCUTANEOUS PEN INJECTOR: SUBCUTANEOUS | 84 days supply | Qty: 6 | Fill #2

## 2020-07-23 ENCOUNTER — Ambulatory Visit
Admission: RE | Admit: 2020-07-23 | Discharge: 2020-07-23 | Disposition: A | Discharge status: Home / Self Care | Payer: Medicare Other | Source: Ambulatory Visit | Attending: Radiation Oncology | Admitting: Radiation Oncology

## 2020-07-23 ENCOUNTER — Inpatient Hospital Stay: Payer: Medicare Other | Admitting: Dietician

## 2020-07-23 ENCOUNTER — Other Ambulatory Visit: Payer: Self-pay

## 2020-07-23 DIAGNOSIS — C109 Malignant neoplasm of oropharynx, unspecified: Secondary | ICD-10-CM

## 2020-07-23 DIAGNOSIS — R634 Abnormal weight loss: Secondary | ICD-10-CM | POA: Insufficient documentation

## 2020-07-23 DIAGNOSIS — Z1329 Encounter for screening for other suspected endocrine disorder: Secondary | ICD-10-CM

## 2020-07-23 DIAGNOSIS — C090 Malignant neoplasm of tonsillar fossa: Secondary | ICD-10-CM | POA: Insufficient documentation

## 2020-07-23 LAB — CBC WITH DIFFERENTIAL (CANCER CENTER ONLY)
Abs Immature Granulocytes: 0.06 10*3/uL (ref 0.00–0.07)
Basophils Absolute: 0.1 10*3/uL (ref 0.0–0.1)
Basophils Relative: 0 %
Eosinophils Absolute: 0.1 10*3/uL (ref 0.0–0.5)
Eosinophils Relative: 0 %
HCT: 42.3 % (ref 36.0–46.0)
Hemoglobin: 14 g/dL (ref 12.0–15.0)
Immature Granulocytes: 0 %
Lymphocytes Relative: 12 %
Lymphs Abs: 1.7 10*3/uL (ref 0.7–4.0)
MCH: 31.9 pg (ref 26.0–34.0)
MCHC: 33.1 g/dL (ref 30.0–36.0)
MCV: 96.4 fL (ref 80.0–100.0)
Monocytes Absolute: 0.5 10*3/uL (ref 0.1–1.0)
Monocytes Relative: 4 %
Neutro Abs: 11.8 10*3/uL — ABNORMAL HIGH (ref 1.7–7.7)
Neutrophils Relative %: 84 %
Platelet Count: 296 10*3/uL (ref 150–400)
RBC: 4.39 MIL/uL (ref 3.87–5.11)
RDW: 13.7 % (ref 11.5–15.5)
WBC Count: 14.2 10*3/uL — ABNORMAL HIGH (ref 4.0–10.5)
nRBC: 0 % (ref 0.0–0.2)

## 2020-07-23 LAB — BASIC METABOLIC PANEL - CANCER CENTER ONLY
Anion gap: 11 (ref 5–15)
BUN: 13 mg/dL (ref 8–23)
CO2: 26 mmol/L (ref 22–32)
Calcium: 9.6 mg/dL (ref 8.9–10.3)
Chloride: 103 mmol/L (ref 98–111)
Creatinine: 1.14 mg/dL — ABNORMAL HIGH (ref 0.44–1.00)
GFR, Estimated: 53 mL/min — ABNORMAL LOW
Glucose, Bld: 86 mg/dL (ref 70–99)
Potassium: 3.9 mmol/L (ref 3.5–5.1)
Sodium: 140 mmol/L (ref 135–145)

## 2020-07-23 LAB — MAGNESIUM: Magnesium: 2 mg/dL (ref 1.7–2.4)

## 2020-07-23 LAB — TSH: TSH: 0.551 u[IU]/mL (ref 0.308–3.960)

## 2020-07-23 MED ORDER — OSMOLITE 1.5 CAL PO LIQD: ORAL | 0 refills | Status: DC

## 2020-07-23 NOTE — Progress Notes (Signed)
Nutrition Assessment  ASSESSMENT: 70 year old female with newly diagnosed squamous cell carcinoma of oropharynx. Plans for concurrent chemoradiation with cisplatin. Patient is scheduled for PEG/port placement on 6/1.  Past medical history of HTN, COPD, status epilepticus, HLD.   Met with patient and sister in clinic. Patient reports not having a big appetite and could go all day without eating if she is busy working. Patient is retired, but enjoys working on projects and currently renovating a home that she is hoping to have finished and move into before starting treatments. She is a "good water drinker" and has a large 32 oz thermos she fills multiple times through out the day. Patient drinks a cup of coffee in the morning and has a small bottle of diet coke with dinner on occasion. Patient reports she does not eat breakfast and will snack on something small, recalls donuts and eats dinner meal. Recently eating a lot of take out meals due to small kitchen where they are currently living. Last night she had 1 hotdog with mustard, chili, slaw and fries. Patient denies weight loss says she has been the same weight 160-165 lbs and worn the same size clothes for years.  Medications: Wellbutrin, Coreg, Decadron, Flexeril, Flaxseed oil, MVI, Naproxen, Prilosec, Zofran, Oxycodone, Klor-con, Compazine, D3, Fosamax   Labs: Cr 1.14   Anthropometrics: No weight history for review. Patient 162 lb 14.4 oz on 5/17 stable with reported usual weight  Height: 5'6" Weight: 73.9 kg  UBW: 160-165 lb per pt BMI: 26.29   Estimated Energy Needs  Kcals: 2070-2215 Protein: 110-125 Fluid: 2.2 L  TF Goal: 6 cartons Osmolite 1.5/day Give 1 1/2 carton Osmolite 1.5 QID with 60 ml water flush before and after each feeding. Drink by mouth or give via tube additional 720 ml (3 cups) daily  -This provides 2130 kcal, 89.4 grams protein, 2286 ml total water  NUTRITION DIAGNOSIS: Predicted suboptimal intake related to  cancer and associated treatments as evidenced by dietary recall and planned radiation therapy affecting ability to eat as normal  INTERVENTION:  Educated on the importance of adequate intake of calories and protein to maintain strength/weights and nutrition  Discussed nutrition impact symptoms associated with chemotherapy and radiation therapy Encouraged eating regular textures as able Educated on soft moist, high protein foods, handout provided Discussed increasing daily intake, recommended frequent meals/snacks  High Calorie High Protein snack ideas provided Discussed types of supplements, recommended drinking 4 Ensure Plus or equivalent daily with current intake Recipes for shakes provided Provided education on feeding tube formula Recommended to start using tube for feedings after placement  - giving minimum of 1 carton/day  Bolus feeding instructions provided Contact information provided  MONITORING, EVALUATION, GOAL: Patient will tolerate adequate calories and protein to minimize weight loss    Next Visit: To be scheduled weekly with treatment

## 2020-07-23 NOTE — Progress Notes (Signed)
Oncology Nurse Navigator Documentation  To provide support, encouragement and care continuity, met with Ms. Cathy Knight during her CT SIM. She was accompanied by her friend.  She tolerated procedure without difficulty, denied questions/concerns.    I also met with Ms. Cathy Knight and her friend  to provide PEG/port education prior to 07/28/20                placement.  Provided port educational handout, showed example, provided guidance for post-surgical dsg removal, site care.  . Using  PEG teaching device   and Teach Back, provided education for PEG use and care, including: hand hygiene, gravity bolus administration of daily water flushes and nutritional supplement, fluids and medications; care of tube insertion site including daily dressing change and cleaning; S&S of infection.   . Ms. Cathy Knight correctly verbalized procedures for and provided correct return demonstration of gravity administration of water, dressing change and site care.  . I provided written instructions for PEG flushing/dressing change in support of verbal instruction.   . I provided/described contents of Start of Care Bolus Feeding Kit (3 60 cc syringes, 2 boxes 4x4 drainage sponges, 1 package mesh briefs, 1 roll paper tape, 1 case Osmolite 1.5).  He voiced understanding she is to start using Osmolite per guidance of Nutrition. . She understands I will be available for ongoing PEG support. Provided barium sulfate prep which I obtained from [facility] and reviewed instructions   She knows to call me if she has any further questions or concerns.    Harlow Asa RN, BSN, OCN Head & Neck Oncology Nurse [place] at [facility] Phone # [phone]  Fax # [phone]

## 2020-07-27 ENCOUNTER — Inpatient Hospital Stay (HOSPITAL_BASED_OUTPATIENT_CLINIC_OR_DEPARTMENT_OTHER): Payer: Medicare Other | Admitting: Hematology and Oncology

## 2020-07-27 ENCOUNTER — Telehealth: Payer: Self-pay | Admitting: Hematology and Oncology

## 2020-07-27 ENCOUNTER — Other Ambulatory Visit: Payer: Self-pay

## 2020-07-27 ENCOUNTER — Other Ambulatory Visit: Payer: Self-pay | Admitting: Student

## 2020-07-27 DIAGNOSIS — C109 Malignant neoplasm of oropharynx, unspecified: Secondary | ICD-10-CM

## 2020-07-27 NOTE — Assessment & Plan Note (Addendum)
This is a very pleasant 70 year old female patient with a newly diagnosed squamous cell carcinoma of the oropharynx, p16 positive referred to medical oncology for recommendations.  She had persistent sore throat and right-sided ear pain which warranted further investigation.  Imaging showed intense uptake in the right tonsil suspicious for malignancy, PET/CT showed uptake in the right palatine tonsil with bilateral level 2A and right level 4 nodes reflecting spread of disease. No evidence of distant metastasis.  Given bilateral lymphadenopathy, we discussed about concurrent chemoradiation. We have again today discussed about weekly cisplatin, adverse effects including but not limited to fatigue, nausea, vomiting, ototoxicity, nephrotoxicity, and immunosuppression. She understands that some of the side effects can be permanent and rarely fatal. She will be getting her port placed and G tube. Anticipate initiation of treatment next week.

## 2020-07-27 NOTE — Progress Notes (Signed)
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  She had persistent sore throat and right-sided ear pain which warranted further investigation.  Imaging showed intense uptake in the right tonsil suspicious for malignancy, PET/CT showed uptake in the right palatine tonsil with bilateral level 2A and right level 4 nodes reflecting spread of disease. No evidence of distant metastasis.  Given bilateral lymphadenopathy, we discussed about concurrent chemoradiation. We have again today discussed about weekly cisplatin, adverse effects including but not limited to fatigue, nausea, vomiting, ototoxicity, nephrotoxicity, and immunosuppression. She understands that some of the side effects can be permanent and rarely fatal. She will be getting her port placed and G tube. Anticipate initiation of treatment next week.  No orders of the defined types were placed in this encounter.   HISTORY OF PRESENTING ILLNESS:   Cathy Knight 70 y.o. female is here because of SCC oropharynx, P 16 +  Chronology  This is a very pleasant 70 year old female patient with newly diagnosed oropharyngeal cancer referred to medical oncology for recommendations.  During her last visit we discussed about concurrent chemoradiation given bilateral disease.  Interval History  Cathy Knight is here for a follow up. Since her last visit, she had her port and G-tube scheduled.  She is understandably anxious about proceeding with chemoradiation.   She has ongoing pain which is mostly bothersome when it radiates to her right ear. No other complaints. She has been using  oxycodone about once a day. Rest of the pertinent 10 point ROS reviewed and negative.  MEDICAL HISTORY:  History reviewed. No pertinent past medical history.  SURGICAL HISTORY: History reviewed. No pertinent surgical history.  SOCIAL HISTORY: Social History   Socioeconomic History  . Marital status: Single    Spouse name: Not on file  . Number of children: Not on file  . Years of education: Not on file  . Highest education level: Not on file  Occupational History  . Not on file  Tobacco Use  . Smoking status: Current Every Day Smoker    Packs/day: 1.00    Years: 15.00    Pack years: 15.00    Types: Cigarettes  . Smokeless tobacco: Not on file  Substance and Sexual Activity  . Alcohol use: Not on file  . Drug use: Not on file  . Sexual activity: Not on file  Other Topics Concern  . Not on file  Social History Narrative  . Not on file   Social Determinants of Health   Financial Resource Strain: Not on file  Food Insecurity: Not on file  Transportation Needs: Not on file  Physical Activity: Not on file  Stress: Not on file  Social Connections: Not on file  Intimate Partner Violence: Not on file    FAMILY HISTORY: History reviewed. No pertinent family history.  ALLERGIES:  is allergic to atorvastatin.  MEDICATIONS:  Current Outpatient Medications  Medication Sig Dispense Refill  . amLODipine (NORVASC) 5 MG tablet Take 5 mg by mouth daily.    Marland Kitchen aspirin 81 MG chewable tablet Chew 81 mg by mouth daily.    Marland Kitchen buPROPion (WELLBUTRIN SR) 150 MG 12 hr tablet Start one week before quit  date. Take 1 tab daily x 3 days, then 1 tab BID thereafter. (Patient taking differently: Take 150 mg by mouth daily.) 60 tablet 2  . calcium citrate-vitamin D (CITRACAL+D) 315-200 MG-UNIT tablet Take 1 tablet by mouth 2 (two) times daily.    . carvedilol (COREG) 6.25 MG tablet Take 6.25 mg by mouth 2 (two) times daily with a meal.    . Cholecalciferol (VITAMIN D3) 125 MCG (5000 UT) CAPS  Take 5,000 Units by mouth daily.    Marland Kitchen co-enzyme Q-10 30 MG capsule Take 30 mg by mouth daily.    . cyclobenzaprine (FLEXERIL) 10 MG tablet Take 10 mg by mouth 3 (three) times daily as needed for muscle spasms.    Marland Kitchen dexamethasone (DECADRON) 4 MG tablet Take 2 tablets (8 mg total) by mouth daily. Take daily x 3 days starting the day after cisplatin chemotherapy. Take with food. 30 tablet 1  . Evolocumab (REPATHA SURECLICK) 440 MG/ML SOAJ Inject 140 mg into the skin every 14 (fourteen) days.    . Flaxseed, Linseed, (FLAXSEED OIL) 1000 MG CAPS Take 2,000 mg by mouth daily.    Marland Kitchen lidocaine-prilocaine (EMLA) cream Apply to affected area once 30 g 3  . lisinopril (ZESTRIL) 20 MG tablet Take 1 tablet by mouth daily.    . Multiple Vitamins-Minerals (MULTIVITAMIN WOMEN 50+ PO) Take 1 tablet by mouth daily.    . nicotine (NICODERM CQ - DOSED IN MG/24 HOURS) 21 mg/24hr patch Place 21 mg onto the skin daily.    . Nutritional Supplements (FEEDING SUPPLEMENT, OSMOLITE 1.5 CAL,) LIQD Osmolite 1.5 x 6 cartons/day - Give 1 1/2 cartons via PEG QID. Flush tube with 60 ml water before and after each feeding. Drink by mouth or give via tube additional 720 ml (3c.) water daily. 1422 mL 0  . omeprazole (PRILOSEC) 20 MG capsule Take 20 mg by mouth daily.    . ondansetron (ZOFRAN) 8 MG tablet Take 1 tablet (8 mg total) by mouth 2 (two) times daily as needed. Start on the third day after cisplatin chemotherapy. 30 tablet 1  . oxyCODONE 10 MG TABS Take 1 tablet (10 mg total) by mouth once as needed for severe pain (cancer pain). (Patient taking differently: Take 10 mg by mouth daily as needed for severe pain (cancer pain).) 30 tablet 0  . potassium chloride (KLOR-CON) 10 MEQ tablet Take 10 mEq by mouth daily.    . predniSONE (DELTASONE) 20 MG tablet Take 20 mg by mouth daily as needed (COPD).    Marland Kitchen prochlorperazine (COMPAZINE) 10 MG tablet Take 1 tablet (10 mg total) by mouth every 6 (six) hours as needed (Nausea or vomiting). 30  tablet 1  . sharps container Use as directed for sharps disposal    . SPIRIVA RESPIMAT 2.5 MCG/ACT AERS Inhale 2 puffs into the lungs daily.    . vitamin B-12 (CYANOCOBALAMIN) 500 MCG tablet Take 500 mcg by mouth daily.    Marland Kitchen albuterol (VENTOLIN HFA) 108 (90 Base) MCG/ACT inhaler Inhale 1-2 puffs into the lungs every 6 (six) hours as needed for wheezing or shortness of breath.     No current facility-administered medications for this visit.   Facility-Administered Medications Ordered in Other Visits  Medication Dose Route Frequency Provider Last Rate Last Admin  . 0.9 %  sodium chloride infusion   Intravenous Continuous Louk, Alexandra M, PA-C      . HYDROcodone-acetaminophen (NORCO/VICODIN) 5-325 MG per tablet 1-2 tablet  1-2 tablet Oral Q4H PRN Criselda Peaches, MD      .  HYDROmorphone (DILAUDID) injection 1 mg  1 mg Intravenous Q2H PRN Criselda Peaches, MD      . ondansetron [facility]) injection 4 mg  4 mg Intravenous Q4H PRN Criselda Peaches, MD        PHYSICAL EXAMINATION:  ECOG PERFORMANCE STATUS: 1 - Symptomatic but completely ambulatory  Vitals:   07/27/20 1257  BP: 137/70  Pulse: 75  Resp: 18  Temp: 97.9 F (36.6 C)  SpO2: 100%   Filed Weights   07/27/20 1257  Weight: 160 lb (72.6 kg)   Physical Exam Constitutional:      Appearance: Normal appearance.  HENT:     Head: Normocephalic and atraumatic.  Neck:     Comments: Neck fullness but no def palpable LN Cardiovascular:     Rate and Rhythm: Normal rate and regular rhythm.     Pulses: Normal pulses.     Heart sounds: Normal heart sounds.  Pulmonary:     Effort: Pulmonary effort is normal.     Breath sounds: Normal breath sounds.  Abdominal:     General: Abdomen is flat. Bowel sounds are normal.     Palpations: Abdomen is soft.  Musculoskeletal:     Cervical back: Normal range of motion and neck supple.  Lymphadenopathy:     Cervical: No cervical adenopathy.  Skin:    General: Skin is warm and dry.   Neurological:     General: No focal deficit present.     Mental Status: She is alert.  Psychiatric:        Mood and Affect: Mood normal.    LABORATORY DATA:  I have reviewed the data as listed Lab Results  Component Value Date   WBC 12.5 (H) 07/28/2020   HGB 14.6 07/28/2020   HCT 44.6 07/28/2020   MCV 97.8 07/28/2020   PLT 315 07/28/2020     Chemistry      Component Value Date/Time   NA 140 07/23/2020 1243   K 3.9 07/23/2020 1243   CL 103 07/23/2020 1243   CO2 26 07/23/2020 1243   BUN 13 07/23/2020 1243   CREATININE 1.14 (H) 07/23/2020 1243      Component Value Date/Time   CALCIUM 9.6 07/23/2020 1243   ALKPHOS 55 07/13/2020 1239   AST 14 (L) 07/13/2020 1239   ALT 8 07/13/2020 1239   BILITOT 0.3 07/13/2020 1239       In situ study for HPV 16/18 is negative. Slides will ne sent to [facility] for a broader HR HPV panel  Addendum electronically signed by Isac Caddy Askin, MD on 06/18/2020 at 5:04 PM  Addendum    The p16 immunostain is positive. HR HPV study ordered.  Addendum electronically signed by Isac Caddy Askin, MD on 06/18/2020 at 1:57 PM  Diagnosis    A: Throat, right, biopsy - Fragments of keratinizing well differentiated squamous carcinoma.  A single focus of submucosa with invasive carcinoma is present   Addendum 3    Noted 07/01/2000: Tissue was sent to [facility] for a broad HR HPV ISH study.  Results were: Negative.  The controls were appropriate      RADIOGRAPHIC STUDIES: I have personally reviewed the radiological images as listed and agreed with the findings in the report. No results found.  All questions were answered. The patient knows to call the clinic with any problems, questions or concerns.     Benay Pike, MD 07/28/2020 11:48 AM

## 2020-07-27 NOTE — Telephone Encounter (Signed)
Scheduled per los. Gave avs and calendar  

## 2020-07-28 ENCOUNTER — Encounter: Payer: Self-pay | Admitting: Hematology and Oncology

## 2020-07-28 ENCOUNTER — Telehealth: Payer: Self-pay | Admitting: Hematology and Oncology

## 2020-07-28 ENCOUNTER — Ambulatory Visit (HOSPITAL_COMMUNITY): Payer: Medicare Other

## 2020-07-28 ENCOUNTER — Ambulatory Visit (HOSPITAL_COMMUNITY)
Admission: RE | Admit: 2020-07-28 | Discharge: 2020-07-28 | Disposition: A | Discharge status: Home / Self Care | Payer: Medicare Other | Source: Ambulatory Visit | Attending: Hematology and Oncology | Admitting: Hematology and Oncology

## 2020-07-28 ENCOUNTER — Encounter (HOSPITAL_COMMUNITY): Payer: Self-pay

## 2020-07-28 DIAGNOSIS — C109 Malignant neoplasm of oropharynx, unspecified: Secondary | ICD-10-CM | POA: Diagnosis not present

## 2020-07-28 DIAGNOSIS — Z79899 Other long term (current) drug therapy: Secondary | ICD-10-CM | POA: Insufficient documentation

## 2020-07-28 DIAGNOSIS — F17210 Nicotine dependence, cigarettes, uncomplicated: Secondary | ICD-10-CM | POA: Insufficient documentation

## 2020-07-28 DIAGNOSIS — Z888 Allergy status to other drugs, medicaments and biological substances status: Secondary | ICD-10-CM | POA: Insufficient documentation

## 2020-07-28 DIAGNOSIS — Z7982 Long term (current) use of aspirin: Secondary | ICD-10-CM | POA: Diagnosis not present

## 2020-07-28 HISTORY — PX: IR IMAGING GUIDED PORT INSERTION: IMG5740

## 2020-07-28 HISTORY — PX: IR GASTROSTOMY TUBE MOD SED: IMG625

## 2020-07-28 LAB — CBC
HCT: 44.6 % (ref 36.0–46.0)
Hemoglobin: 14.6 g/dL (ref 12.0–15.0)
MCH: 32 pg (ref 26.0–34.0)
MCHC: 32.7 g/dL (ref 30.0–36.0)
MCV: 97.8 fL (ref 80.0–100.0)
Platelets: 315 10*3/uL (ref 150–400)
RBC: 4.56 MIL/uL (ref 3.87–5.11)
RDW: 13.8 % (ref 11.5–15.5)
WBC: 12.5 10*3/uL — ABNORMAL HIGH (ref 4.0–10.5)
nRBC: 0 % (ref 0.0–0.2)

## 2020-07-28 LAB — PROTIME-INR
INR: 0.9 (ref 0.8–1.2)
Prothrombin Time: 12.1 seconds (ref 11.4–15.2)

## 2020-07-28 MED ORDER — MIDAZOLAM HCL 2 MG/2ML IJ SOLN
INTRAMUSCULAR | Status: AC | PRN
  Administered 2020-07-28 (×5): 1 mg via INTRAVENOUS

## 2020-07-28 MED ORDER — MIDAZOLAM HCL 2 MG/2ML IJ SOLN
INTRAMUSCULAR | Status: AC
  Filled 2020-07-28: qty 6

## 2020-07-28 MED ORDER — HEPARIN SOD (PORK) LOCK FLUSH 100 UNIT/ML IV SOLN
INTRAVENOUS | Status: AC
  Administered 2020-07-28: 5 [IU]
  Filled 2020-07-28: qty 5

## 2020-07-28 MED ORDER — LIDOCAINE HCL 1 % IJ SOLN
INTRAMUSCULAR | Status: AC
  Administered 2020-07-28: 5 mL via SUBCUTANEOUS
  Filled 2020-07-28: qty 20

## 2020-07-28 MED ORDER — HYDROMORPHONE HCL 1 MG/ML IJ SOLN
1.0000 mg | INTRAMUSCULAR | Status: DC | PRN
Start: 2020-07-28 — End: 2020-07-29

## 2020-07-28 MED ORDER — HEPARIN SOD (PORK) LOCK FLUSH 100 UNIT/ML IV SOLN
INTRAVENOUS | Status: AC | PRN
  Administered 2020-07-28: 500 [IU] via INTRAVENOUS

## 2020-07-28 MED ORDER — IOHEXOL 300 MG/ML  SOLN
50.0000 mL | Freq: Once | INTRAMUSCULAR | Status: AC | PRN
  Administered 2020-07-28: 20 mL

## 2020-07-28 MED ORDER — LIDOCAINE-EPINEPHRINE 1 %-1:100000 IJ SOLN
INTRAMUSCULAR | Status: AC
  Administered 2020-07-28: 15 mL via SUBCUTANEOUS
  Filled 2020-07-28: qty 1

## 2020-07-28 MED ORDER — SODIUM CHLORIDE 0.9 % IV SOLN: INTRAVENOUS | Status: DC

## 2020-07-28 MED ORDER — ONDANSETRON HCL 4 MG/2ML IJ SOLN
4.0000 mg | INTRAMUSCULAR | Status: DC | PRN
Start: 2020-07-28 — End: 2020-07-29

## 2020-07-28 MED ORDER — CEFAZOLIN SODIUM-DEXTROSE 2-4 GM/100ML-% IV SOLN: 2.0000 g | Freq: Once | INTRAVENOUS | Status: AC

## 2020-07-28 MED ORDER — FENTANYL CITRATE (PF) 100 MCG/2ML IJ SOLN
INTRAMUSCULAR | Status: AC
  Filled 2020-07-28: qty 4

## 2020-07-28 MED ORDER — FENTANYL CITRATE (PF) 100 MCG/2ML IJ SOLN
INTRAMUSCULAR | Status: AC | PRN
  Administered 2020-07-28 (×3): 50 ug via INTRAVENOUS

## 2020-07-28 MED ORDER — HYDROCODONE-ACETAMINOPHEN 5-325 MG PO TABS
1.0000 | ORAL_TABLET | ORAL | Status: DC | PRN
  Administered 2020-07-28: 2 via ORAL
  Filled 2020-07-28: qty 2

## 2020-07-28 MED ORDER — GLUCAGON HCL RDNA (DIAGNOSTIC) 1 MG IJ SOLR
INTRAMUSCULAR | Status: AC | PRN
  Administered 2020-07-28: 1 mg via INTRAVENOUS

## 2020-07-28 MED ORDER — CEFAZOLIN SODIUM-DEXTROSE 2-4 GM/100ML-% IV SOLN
INTRAVENOUS | Status: AC
  Administered 2020-07-28: 2 g via INTRAVENOUS
  Filled 2020-07-28: qty 100

## 2020-07-28 MED ORDER — GLUCAGON HCL RDNA (DIAGNOSTIC) 1 MG IJ SOLR
INTRAMUSCULAR | Status: AC
  Filled 2020-07-28: qty 1

## 2020-07-28 NOTE — Progress Notes (Signed)
G tube placed to low wall suction as ordered.

## 2020-07-28 NOTE — Procedures (Signed)
Interventional Radiology Procedure Note  Procedure:   1.) Placement of a right IJ approach single lumen PowerPort.  Tip is positioned at the superior cavoatrial junction and catheter is ready for immediate use.  2.) Placement of percutaneous 55F balloon retention gastrostomy tube.  Complications: No immediate  EBL: None  Recommendations:  - Clear liquid diet - Maintain G-tube to LWS x 3 hrs  - May advance diet as tolerated and begin using tube tomorrow morning   Signed,  Criselda Peaches, MD

## 2020-07-28 NOTE — Progress Notes (Signed)
Discharge instructions reviewed with pt and Ann both voice understanding.

## 2020-07-28 NOTE — Discharge Instructions (Addendum)
Gastrostomy Tube Home Guide, Adult A gastrostomy tube, or G-tube, is a tube that is inserted through the abdomen into the stomach. The tube is used to give feedings and medicines when a person cannot eat and drink enough on his or her own or take medicines by mouth. How to care for the insertion site Supplies needed:  Saline solution or clean, warm water and soap. Saline solution is made of salt and water.  Cotton swab or gauze.  Pre-cut gauze bandage (dressing) and tape, if needed. Instructions Follow these steps daily to clean the insertion site: 1. Wash your hands with soap and water for at least 20 seconds. 2. Remove the dressing (if there is one) that is between the person's skin and the tube. 3. Check the area where the tube enters the skin. Check daily for problems such as: ? Redness, rash, or irritation. ? Swelling. ? Pus-like drainage. ? Extra skin growth. 4. Moisten the cotton swab or gauze with the saline solution or with a soap-and-water mixture. Gently clean around the insertion site. Remove any drainage or crusted material. ? When the G-tube is first put in, a normal saline solution or water can be used to clean the skin. ? After the skin around the tube has healed, mild soap and water may be used. 5. Apply a dressing (if there should be one) between the person's skin and the tube.   How to flush a G-tube Flush the G-tube regularly to keep it from clogging. Flush it before and after feedings and as often as told by the health care provider. Supplies needed:  Purified or germ-free (sterile) water, warmed.  Container with lid for boiling water, if needed.  60 cc G-tube syringe. Instructions Before you begin, decide whether to use sterile water or purified drinking water.  Use only sterile water if: ? The person has a weak disease-fighting (immune) system. ? The person has trouble fighting off infections (is immunocompromised). ? You are unsure about the amount of  chemical contaminants in purified or drinking water.  Use purified drinking water in all other cases. To purify drinking water by boiling: ? Boil water for at least 1 minute. Keep lid over water while it boils. ? Let water cool to room temperature before using. Follow these steps to flush the G-tube: 1. Wash your hands with soap and water for at least 20 seconds. 2. Bring out (draw up) 30 mL of warm water in a syringe. 3. Connect the syringe to the tube. 4. Slowly and gently push the water into the tube. General tips  If the tube comes out: ? Cover the opening with a clean dressing and tape. ? Get help right away.  If there is skin or scar tissue growing where the tube enters the skin: ? Keep the area clean and dry. ? Secure the tube with tape so that the tube does not move around too much.  If the tube gets clogged: ? Slowly push warm water into the tube with a large syringe. ? Do not force the fluid into the tube or push an object into the tube. ? Get help right away if you cannot unclog the tube. Follow these instructions at home: Feedings  Give feedings at room temperature.  If feedings are continuous: ? Do not put more than 4 hours' worth of feedings in the feeding bag. ? Stop the feedings when you need to give medicine or flush the tube. Be sure to restart the feedings. ? Make sure  the person's head is above his or her stomach (upright position). This will prevent choking and discomfort.  Make sure the person is in the right position during and after feedings. ? During feedings, have the person in the upright position. ? After a non-continuous feeding (bolus feeding), have the person stay in the upright position for 1 hour.  Cover and place unused feedings in the refrigerator.  Replace feeding bags and syringes as told. Good hygiene  Make sure the person takes good care of his or her mouth and teeth (oral hygiene), such as by brushing his or her teeth.  Keep the  area where the tube enters the skin clean and dry. General instructions  Use syringes made only for G-tubes.  Do not pull or put tension on the tube.  Before you remove the tube cap or disconnect a syringe, close the tube by using a clamp (clamping) or bending (kinking) the tube.  Measure the length of the G-tube every day from the insertion site to the end of the tube.  If the person's G-tube has a balloon, check the fluid in the balloon every week. Check the manufacturer's specifications to find the amount of fluid that should be in the balloon.  Remove excess air from the G-tube as told. This is called venting.  Do not push feedings, medicines, or flushes fast. Contact a health care provider if:  The person with the tube has constipation or a fever.  A large amount of fluid or mucus-like liquid is leaking from the tube.  Skin or scar tissue appears to be growing where the tube enters the skin.  The length of tube from the insertion site to the G-tube gets longer. Get help right away if:  The person with the tube has any of these problems: ? Severe pain, tenderness, or bloating in the abdomen. ? Nausea or vomiting. ? Trouble breathing or shortness of breath.  Any of these problems happen in the area where the tube enters the skin: ? Redness, irritation, swelling, or soreness. ? Pus-like discharge. ? A bad smell.  The tube is clogged and cannot be flushed.  The tube comes out. The tube will need to put back in within 4 hours. Summary  A gastrostomy tube, or G-tube, is a tube that is inserted through the abdomen into the stomach. The tube is used to give feedings and medicines when a person cannot eat and drink enough on his or her own or cannot take medicine by mouth.  Check and clean the insertion site daily as told by the person's health care provider.  Flush the G-tube regularly to keep it from clogging. Flush it before and after feedings and as often as  told.  Keep the area where the tube enters the skin clean and dry. This information is not intended to replace advice given to you by your health care provider. Make sure you discuss any questions you have with your health care provider. Document Revised: 06/30/2019 Document Reviewed: 07/03/2019 Elsevier Patient Education  2021 [place] Insertion, Care After This sheet gives you information about how to care for yourself after your procedure. Your health care provider may also give you more specific instructions. If you have problems or questions, contact your health care provider. What can I expect after the procedure? After the procedure, it is common to have:  Discomfort at the port insertion site.  Bruising on the skin over the port. This should improve over 3-4 days. Follow  these instructions at home: [facility] care  After your port is placed, you will get a manufacturer's information card. The card has information about your port. Keep this card with you at all times.  Take care of the port as told by your health care provider. Ask your health care provider if you or a family member can get training for taking care of the port at home. A home health care nurse may also take care of the port.  Make sure to remember what type of port you have. Incision care  Follow instructions from your health care provider about how to take care of your port insertion site. Make sure you: ? Wash your hands with soap and water before and after you change your bandage (dressing). If soap and water are not available, use hand sanitizer. ? Change your dressing as told by your health care provider. ? Leave stitches (sutures), skin glue, or adhesive strips in place. These skin closures may need to stay in place for 2 weeks or longer. If adhesive strip edges start to loosen and curl up, you may trim the loose edges. Do not remove adhesive strips completely unless your health care provider  tells you to do that.  Check your port insertion site every day for signs of infection. Check for: ? Redness, swelling, or pain. ? Fluid or blood. ? Warmth. ? Pus or a bad smell.      Activity  Return to your normal activities as told by your health care provider. Ask your health care provider what activities are safe for you.  Do not lift anything that is heavier than 10 lb (4.5 kg), or the limit that you are told, until your health care provider says that it is safe. General instructions  Take over-the-counter and prescription medicines only as told by your health care provider.  Do not take baths, swim, or use a hot tub until your health care provider approves. Ask your health care provider if you may take showers. You may only be allowed to take sponge baths.  Do not drive for 24 hours if you were given a sedative during your procedure.  Wear a medical alert bracelet in case of an emergency. This will tell any health care providers that you have a port.  Keep all follow-up visits as told by your health care provider. This is important. Contact a health care provider if:  You cannot flush your port with saline as directed, or you cannot draw blood from the port.  You have a fever or chills.  You have redness, swelling, or pain around your port insertion site.  You have fluid or blood coming from your port insertion site.  Your port insertion site feels warm to the touch.  You have pus or a bad smell coming from the port insertion site. Get help right away if:  You have chest pain or shortness of breath.  You have bleeding from your port that you cannot control. Summary  Take care of the port as told by your health care provider. Keep the manufacturer's information card with you at all times.  Change your dressing as told by your health care provider.  Contact a health care provider if you have a fever or chills or if you have redness, swelling, or pain around your  port insertion site.  Keep all follow-up visits as told by your health care provider. This information is not intended to replace advice given to you by your health care  provider. Make sure you discuss any questions you have with your health care provider. Document Revised: 09/11/2017 Document Reviewed: 09/11/2017 Elsevier Patient Education  2021 [place]. Moderate Conscious Sedation, Adult Sedation is the use of medicines to promote relaxation and to relieve discomfort and anxiety. Moderate conscious sedation is a type of sedation. Under moderate conscious sedation, you are less alert than normal, but you are still able to respond to instructions, touch, or both. Moderate conscious sedation is used during short medical and dental procedures. It is milder than deep sedation, which is a type of sedation under which you cannot be easily woken up. It is also milder than general anesthesia, which is the use of medicines to make you unconscious. Moderate conscious sedation allows you to return to your regular activities sooner. Tell a health care provider about:  Any allergies you have.  All medicines you are taking, including vitamins, herbs, eye drops, creams, and over-the-counter medicines.  Any use of steroids. This includes steroids taken by mouth or as a cream.  Any problems you or family members have had with sedatives and anesthetic medicines.  Any blood disorders you have.  Any surgeries you have had.  Any medical conditions you have, such as sleep apnea.  Whether you are pregnant or may be pregnant.  Any use of cigarettes, alcohol, marijuana, or drugs. What are the risks? Generally, this is a safe procedure. However, problems may occur, including:  Getting too much medicine (oversedation).  Nausea.  Allergic reaction to medicines.  Trouble breathing. If this happens, a breathing tube may be used. It will be removed when you are awake and breathing on your own.  Heart  trouble.  Lung trouble.  Confusion that gets better with time (emergence delirium). What happens before the procedure? Staying hydrated Follow instructions from your health care provider about hydration, which may include:  Up to 2 hours before the procedure - you may continue to drink clear liquids, such as water, clear fruit juice, black coffee, and plain tea. Eating and drinking restrictions Follow instructions from your health care provider about eating and drinking, which may include:  8 hours before the procedure - stop eating heavy meals or foods, such as meat, fried foods, or fatty foods.  6 hours before the procedure - stop eating light meals or foods, such as toast or cereal.  6 hours before the procedure - stop drinking milk or drinks that contain milk.  2 hours before the procedure - stop drinking clear liquids. Medicines Ask your health care provider about:  Changing or stopping your regular medicines. This is especially important if you are taking diabetes medicines or blood thinners.  Taking medicines such as aspirin and ibuprofen. These medicines can thin your blood. Do not take these medicines unless your health care provider tells you to take them.  Taking over-the-counter medicines, vitamins, herbs, and supplements. Tests and exams  You will have a physical exam.  You may have blood tests done to show how well: ? Your kidneys and liver work. ? Your blood clots. General instructions  Plan to have a responsible adult take you home from the hospital or clinic.  If you will be going home right after the procedure, plan to have a responsible adult care for you for the time you are told. This is important. What happens during the procedure?  You will be given the sedative. The sedative may be given: ? As a pill that you will swallow. It can also be inserted  into the rectum. ? As a spray through the nose. ? As an injection into the muscle. ? As an injection  into the vein through an IV.  You may be given oxygen as needed.  Your breathing, heart rate, and blood pressure will be monitored during the procedure.  The medical or dental procedure will be done. The procedure may vary among health care providers and hospitals.   What happens after the procedure?  Your blood pressure, heart rate, breathing rate, and blood oxygen level will be monitored until you leave the hospital or clinic.  You will get fluids through your IV if needed.  Do not drive or operate machinery until your health care provider says that it is safe. Summary  Sedation is the use of medicines to promote relaxation and to relieve discomfort and anxiety. Moderate conscious sedation is a type of sedation that is used during short medical and dental procedures.  Tell the health care provider about any medical conditions that you have and about all the medicines that you are taking.  You will be given the sedative as a pill, a spray through the nose, an injection into the muscle, or an injection into the vein through an IV. Vital signs are monitored during the sedation.  Moderate conscious sedation allows you to return to your regular activities sooner. This information is not intended to replace advice given to you by your health care provider. Make sure you discuss any questions you have with your health care provider. Document Revised: 06/13/2019 Document Reviewed: 01/09/2019 Elsevier Patient Education  2021 [employer].

## 2020-07-28 NOTE — H&P (Signed)
Chief Complaint: Patient was seen in consultation today for image guided Port-A-Cath and gastrostomy tube placement at the request of [place]  Referring Physician(s): Iruku,Praveena  Supervising Physician: Jacqulynn Cadet  Patient Status: [facility] - Out-pt  History of Present Illness: Cathy Knight is a 70 y.o. female with newly diagnosed squamous cell carcinoma of the oropharynx, originally confirmed by pathology on 06/11/2020 at [facility]. Patient had persistent sore throat and right-sided ear pain which warranted further investigation, PET/CT scan at OSH showed intense uptake in the right tonsil suspicious for malignancy with findings reflecting spread of disease.  PET/CT showed no evidence of distant metastasis.  Patient ultimately underwent biopsy and pathology revealed a squamous cell carcinoma.  Patient was referred to hematology and oncology for further evaluation of the newly diagnosed cancer, had initial consultation with Dr. Chryl Heck on 07/13/2020.  A chemoradiation therapy was recommended to the patient as a treatment option for the oropharynx cancer, as well as as a gastrostomy tube placement due to expected severe mucositis during the radiation therapy which will affect patient's oral intake significantly. After thorough discussion and shared decision making with her oncology team, patient decided to proceed with chemoradiation therapy and a gastrostomy tube placement.  IR was requested for image guided Port-A-Cath and gastrostomy tube placement.  Patient laying in bed, not in acute distress.  Denise headache, fever, chills, shortness of breath, cough, chest pain, abdominal pain, nausea ,vomiting, and bleeding.   History reviewed. No pertinent past medical history.  History reviewed. No pertinent surgical history.  Allergies: Atorvastatin  Medications: Prior to Admission medications   Medication Sig Start Date End Date Taking? Authorizing Provider  albuterol  (VENTOLIN HFA) 108 (90 Base) MCG/ACT inhaler Inhale 1-2 puffs into the lungs every 6 (six) hours as needed for wheezing or shortness of breath.   Yes [provider]  amLODipine (NORVASC) 5 MG tablet Take 5 mg by mouth daily. 07/09/17  Yes [provider]  aspirin 81 MG chewable tablet Chew 81 mg by mouth daily. 04/24/17  Yes [provider]  buPROPion (WELLBUTRIN SR) 150 MG 12 hr tablet Start one week before quit date. Take 1 tab daily x 3 days, then 1 tab BID thereafter. Patient taking differently: Take 150 mg by mouth daily. 07/13/20  Yes Eppie Gibson, MD  calcium citrate-vitamin D (CITRACAL+D) 315-200 MG-UNIT tablet Take 1 tablet by mouth 2 (two) times daily.   Yes [provider]  carvedilol (COREG) 6.25 MG tablet Take 6.25 mg by mouth 2 (two) times daily with a meal. 05/10/20  Yes [provider]  Cholecalciferol (VITAMIN D3) 125 MCG (5000 UT) CAPS Take 5,000 Units by mouth daily.   Yes [provider]  co-enzyme Q-10 30 MG capsule Take 30 mg by mouth daily.   Yes [provider]  cyclobenzaprine (FLEXERIL) 10 MG tablet Take 10 mg by mouth 3 (three) times daily as needed for muscle spasms. 10/10/19  Yes [provider]  Evolocumab (REPATHA SURECLICK) 378 MG/ML SOAJ Inject 140 mg into the skin every 14 (fourteen) days. 02/02/20  Yes [provider]  Flaxseed, Linseed, (FLAXSEED OIL) 1000 MG CAPS Take 2,000 mg by mouth daily.   Yes [provider]  lisinopril (ZESTRIL) 20 MG tablet Take 1 tablet by mouth daily. 05/10/20  Yes [provider]  Multiple Vitamins-Minerals (MULTIVITAMIN WOMEN 50+ PO) Take 1 tablet by mouth daily.   Yes [provider]  omeprazole (PRILOSEC) 20 MG capsule Take 20 mg by mouth daily. 10/31/18  Yes  [provider]  oxyCODONE 10 MG TABS Take 1 tablet (10 mg total) by mouth once as needed for severe pain (cancer pain). Patient taking differently: Take 10 mg by mouth  daily as needed for severe pain (cancer pain). 07/13/20 08/12/20 Yes Iruku, Arletha Pili, MD  potassium chloride (KLOR-CON) 10 MEQ tablet Take 10 mEq by mouth daily.   Yes [provider]  predniSONE (DELTASONE) 20 MG tablet Take 20 mg by mouth daily as needed (COPD). 12/24/19  Yes [provider]  SPIRIVA RESPIMAT 2.5 MCG/ACT AERS Inhale 2 puffs into the lungs daily. 07/04/20  Yes [provider]  vitamin B-12 (CYANOCOBALAMIN) 500 MCG tablet Take 500 mcg by mouth daily.   Yes [provider]  dexamethasone (DECADRON) 4 MG tablet Take 2 tablets (8 mg total) by mouth daily. Take daily x 3 days starting the day after cisplatin chemotherapy. Take with food. 07/21/20   Benay Pike, MD  lidocaine-prilocaine (EMLA) cream Apply to affected area once 07/21/20   Iruku, Arletha Pili, MD  nicotine (NICODERM CQ - DOSED IN MG/24 HOURS) 21 mg/24hr patch Place 21 mg onto the skin daily. 06/29/20   [provider]  Nutritional Supplements (FEEDING SUPPLEMENT, OSMOLITE 1.5 CAL,) LIQD Osmolite 1.5 x 6 cartons/day - Give 1 1/2 cartons via PEG QID. Flush tube with 60 ml water before and after each feeding. Drink by mouth or give via tube additional 720 ml (3c.) water daily. 07/23/20   Eppie Gibson, MD  ondansetron (ZOFRAN) 8 MG tablet Take 1 tablet (8 mg total) by mouth 2 (two) times daily as needed. Start on the third day after cisplatin chemotherapy. 07/21/20   Benay Pike, MD  prochlorperazine (COMPAZINE) 10 MG tablet Take 1 tablet (10 mg total) by mouth every 6 (six) hours as needed (Nausea or vomiting). 07/21/20   Benay Pike, MD  sharps container Use as directed for sharps disposal 02/09/20   [provider]     History reviewed. No pertinent family history.  Social History   Socioeconomic History  . Marital status: Single    Spouse name: Not on file  . Number of children: Not on file  . Years of education: Not on file  . Highest education level: Not on file   Occupational History  . Not on file  Tobacco Use  . Smoking status: Current Every Day Smoker    Packs/day: 1.00    Years: 15.00    Pack years: 15.00    Types: Cigarettes  . Smokeless tobacco: Not on file  Substance and Sexual Activity  . Alcohol use: Not on file  . Drug use: Not on file  . Sexual activity: Not on file  Other Topics Concern  . Not on file  Social History Narrative  . Not on file   Social Determinants of Health   Financial Resource Strain: Not on file  Food Insecurity: Not on file  Transportation Needs: Not on file  Physical Activity: Not on file  Stress: Not on file  Social Connections: Not on file     Review of Systems: A 12 point ROS discussed and pertinent positives are indicated in the HPI above.  All other systems are negative.   Vital Signs: BP (!) 157/74   Pulse 68   Temp 98.2 F (36.8 C) (Oral)   Resp 16   Ht 5\' 6"  (1.676 m)   Wt 160 lb (72.6 kg)   SpO2 100%   BMI 25.82 kg/m   Physical Exam  Vitals and nursing  note reviewed.  Constitutional:      General: He is not in acute distress.    Appearance: Normal appearance.  HENT:     Head: Normocephalic and atraumatic.     Mouth/Throat:     Mouth: Mucous membranes are moist.     Pharynx: Oropharynx is clear.  Cardiovascular:     Rate and Rhythm: Normal rate and regular rhythm.     Pulses: Normal pulses.     Heart sounds: Normal heart sounds.  Pulmonary:     Effort: Pulmonary effort is normal.     Breath sounds: Normal breath sounds. No wheezing, rhonchi or rales.  Abdominal:     General: Bowel sounds are normal. There is no distension.     Palpations: Abdomen is soft.  Skin:    General: Skin is warm and dry.  Neurological:     Mental Status: He is alert and oriented to person, place, and time.  Psychiatric:        Mood and Affect: Mood normal.        Behavior: Behavior normal.    MD Evaluation Airway: WNL Heart: WNL Abdomen: WNL Chest/ Lungs: WNL ASA  Classification:  3 Mallampati/Airway Score: Two  Imaging: No results found.  Labs:  CBC: Recent Labs    07/13/20 1239 07/23/20 1243 07/28/20 0700  WBC 9.1 14.2* 12.5*  HGB 13.9 14.0 14.6  HCT 40.8 42.3 44.6  PLT 289 296 315    COAGS: Recent Labs    07/28/20 0700  INR 0.9    BMP: Recent Labs    07/13/20 1239 07/23/20 1243  NA 141 140  K 3.9 3.9  CL 104 103  CO2 28 26  GLUCOSE 90 86  BUN 10 13  CALCIUM 9.5 9.6  CREATININE 0.85 1.14*  GFRNONAA >60 53*    LIVER FUNCTION TESTS: Recent Labs    07/13/20 1239  BILITOT 0.3  AST 14*  ALT 8  ALKPHOS 55  PROT 6.6  ALBUMIN 3.8    TUMOR MARKERS: No results for input(s): AFPTM, CEA, CA199, CHROMGRNA in the last 8760 hours.  Assessment and Plan: 70 y.o. female with newly diagnosed squamous cell carcinoma of the oropharynx, biopsy-proven on 06/11/2020.  Patient was referred to hematology and oncology who recommended a chemoradiation therapy as well as a gastrostomy tube placement, after thorough discussion and shared decision-making, patient decided to proceed with the procedures.  IR was requested for image guided Port-A-Cath and gastrostomy tube placement. Patient presents to IR today for the procedures. N.p.o. since midnight VS hypertensive INR 0.9 CBC mild leukocytosis at 12.5  On ASA 81 mg, last taken on Monday May 30 th. Discussed with Dr. Laurence Ferrari, ok to proceed with the gastrostomy tube placement.   Risks and benefits of image guided port-a-catheter placement was discussed with the patient including, but not limited to bleeding, infection, pneumothorax, or fibrin sheath development and need for additional procedures.  Risks and benefits image guided gastrostomy tube placement was discussed with the patient including, but not limited to the need for a barium enema during the procedure, bleeding, infection, peritonitis and/or damage to adjacent structures.   All of the patient's questions were answered, patient is  agreeable to proceed. Consent signed and in chart.    Thank you for this interesting consult.  I greatly enjoyed meeting Cathy Knight and look forward to participating in their care.  A copy of this report was sent to the requesting provider on this date.  Electronically Signed: Tera Mater,  PA-C 07/28/2020, 7:53 AM   I spent a total of  30 Minutes   in face to face in clinical consultation, greater than 50% of which was counseling/coordinating care for poar-a-cath and gastrostomy tube placement.

## 2020-07-28 NOTE — Progress Notes (Signed)
G tube place to low suction as ordered

## 2020-07-28 NOTE — Progress Notes (Signed)
Pharmacist Chemotherapy Monitoring - Initial Assessment    Anticipated start date: 08/04/20  Regimen:  . Are orders appropriate based on the patient's diagnosis, regimen, and cycle? Yes . Does the plan date match the patient's scheduled date? Yes . Is the sequencing of drugs appropriate? Yes . Are the premedications appropriate for the patient's regimen? Yes . Prior Authorization for treatment is: Approved o If applicable, is the correct biosimilar selected based on the patient's insurance? not applicable  Organ Function and Labs: Marland Kitchen Are dose adjustments needed based on the patient's renal function, hepatic function, or hematologic function? Yes . Are appropriate labs ordered prior to the start of patient's treatment? Yes . Other organ system assessment, if indicated: N/A . The following baseline labs, if indicated, have been ordered: cisplatin: K, Mg  Dose Assessment: . Are the drug doses appropriate? Yes . Are the following correct: o Drug concentrations Yes o IV fluid compatible with drug Yes o Administration routes Yes o Timing of therapy Yes . If applicable, does the patient have documented access for treatment and/or plans for port-a-cath placement? yes . If applicable, have lifetime cumulative doses been properly documented and assessed? n/a Lifetime Dose Tracking  No doses have been documented on this patient for the following tracked chemicals: Doxorubicin, Epirubicin, Idarubicin, Daunorubicin, Mitoxantrone, Bleomycin, Oxaliplatin, Carboplatin, Liposomal Doxorubicin  o   Toxicity Monitoring/Prevention: . The patient has the following take home antiemetics prescribed: Prochlorperazine . The patient has the following take home medications prescribed: N/A . Medication allergies and previous infusion related reactions, if applicable, have been reviewed and addressed. Yes . The patient's current medication list has been assessed for drug-drug interactions with their chemotherapy  regimen. no significant drug-drug interactions were identified on review.  Order Review: . Are the treatment plan orders signed? Yes . Is the patient scheduled to see a provider prior to their treatment? No  I verify that I have reviewed each item in the above checklist and answered each question accordingly.  Philomena Course, RPH, 07/28/2020  1:25 PM

## 2020-07-28 NOTE — Discharge Instructions (Signed)
Implanted Port Insertion, Care After This sheet gives you information about how to care for yourself after your procedure. Your health care provider may also give you more specific instructions. If you have problems or questions, contact your health care provider. What can I expect after the procedure? After the procedure, it is common to have:  Discomfort at the port insertion site.  Bruising on the skin over the port. This should improve over 3-4 days. Follow these instructions at home: Port care  After your port is placed, you will get a manufacturer's information card. The card has information about your port. Keep this card with you at all times.  Take care of the port as told by your health care provider. Ask your health care provider if you or a family member can get training for taking care of the port at home. A home health care nurse may also take care of the port.  Make sure to remember what type of port you have. Incision care  Follow instructions from your health care provider about how to take care of your port insertion site. Make sure you: ? Wash your hands with soap and water before and after you change your bandage (dressing). If soap and water are not available, use hand sanitizer. ? Change your dressing as told by your health care provider. ? Leave stitches (sutures), skin glue, or adhesive strips in place. These skin closures may need to stay in place for 2 weeks or longer. If adhesive strip edges start to loosen and curl up, you may trim the loose edges. Do not remove adhesive strips completely unless your health care provider tells you to do that.  Check your port insertion site every day for signs of infection. Check for: ? Redness, swelling, or pain. ? Fluid or blood. ? Warmth. ? Pus or a bad smell.      Activity  Return to your normal activities as told by your health care provider. Ask your health care provider what activities are safe for you.  Do not  lift anything that is heavier than 10 lb (4.5 kg), or the limit that you are told, until your health care provider says that it is safe. General instructions  Take over-the-counter and prescription medicines only as told by your health care provider.  Do not take baths, swim, or use a hot tub until your health care provider approves. Ask your health care provider if you may take showers. You may only be allowed to take sponge baths.  Do not drive for 24 hours if you were given a sedative during your procedure.  Wear a medical alert bracelet in case of an emergency. This will tell any health care providers that you have a port.  Keep all follow-up visits as told by your health care provider. This is important. Contact a health care provider if:  You cannot flush your port with saline as directed, or you cannot draw blood from the port.  You have a fever or chills.  You have redness, swelling, or pain around your port insertion site.  You have fluid or blood coming from your port insertion site.  Your port insertion site feels warm to the touch.  You have pus or a bad smell coming from the port insertion site. Get help right away if:  You have chest pain or shortness of breath.  You have bleeding from your port that you cannot control. Summary  Take care of the port as told by your   health care provider. Keep the manufacturer's information card with you at all times.  Change your dressing as told by your health care provider.  Contact a health care provider if you have a fever or chills or if you have redness, swelling, or pain around your port insertion site.  Keep all follow-up visits as told by your health care provider. This information is not intended to replace advice given to you by your health care provider. Make sure you discuss any questions you have with your health care provider. Document Revised: 09/11/2017 Document Reviewed: 09/11/2017 Elsevier Patient Education   [place]. Gastrostomy Tube Home Guide, Adult  Do not use scissors near tube. Flush with 20cc water after meds or at least every shifts   A gastrostomy tube, or G-tube, is a tube that is inserted through the abdomen into the stomach. The tube is used to give feedings and medicines when a person cannot eat and drink enough on his or her own or take medicines by mouth. How to care for the insertion site Supplies needed:  Saline solution or clean, warm water and soap. Saline solution is made of salt and water.  Cotton swab or gauze.  Pre-cut gauze bandage (dressing) and tape, if needed. Instructions Follow these steps daily to clean the insertion site: 1. Wash your hands with soap and water for at least 20 seconds. 2. Remove the dressing (if there is one) that is between the person's skin and the tube. 3. Check the area where the tube enters the skin. Check daily for problems such as: ? Redness, rash, or irritation. ? Swelling. ? Pus-like drainage. ? Extra skin growth. 4. Moisten the cotton swab or gauze with the saline solution or with a soap-and-water mixture. Gently clean around the insertion site. Remove any drainage or crusted material. ? When the G-tube is first put in, a normal saline solution or water can be used to clean the skin. ? After the skin around the tube has healed, mild soap and water may be used. 5. Apply a dressing (if there should be one) between the person's skin and the tube.   How to flush a G-tube Flush the G-tube regularly to keep it from clogging. Flush it before and after feedings and as often as told by the health care provider. Supplies needed:  Purified or germ-free (sterile) water, warmed.  Container with lid for boiling water, if needed.  60 cc G-tube syringe. Instructions Before you begin, decide whether to use sterile water or purified drinking water.  Use only sterile water if: ? The person has a weak disease-fighting (immune)  system. ? The person has trouble fighting off infections (is immunocompromised). ? You are unsure about the amount of chemical contaminants in purified or drinking water.  Use purified drinking water in all other cases. To purify drinking water by boiling: ? Boil water for at least 1 minute. Keep lid over water while it boils. ? Let water cool to room temperature before using. Follow these steps to flush the G-tube: 1. Wash your hands with soap and water for at least 20 seconds. 2. Bring out (draw up) 30 mL of warm water in a syringe. 3. Connect the syringe to the tube. 4. Slowly and gently push the water into the tube. General tips  If the tube comes out: ? Cover the opening with a clean dressing and tape. ? Get help right away.  If there is skin or scar tissue growing where the tube enters  the skin: ? Keep the area clean and dry. ? Secure the tube with tape so that the tube does not move around too much.  If the tube gets clogged: ? Slowly push warm water into the tube with a large syringe. ? Do not force the fluid into the tube or push an object into the tube. ? Get help right away if you cannot unclog the tube. Follow these instructions at home: Feedings  Give feedings at room temperature.  If feedings are continuous: ? Do not put more than 4 hours' worth of feedings in the feeding bag. ? Stop the feedings when you need to give medicine or flush the tube. Be sure to restart the feedings. ? Make sure the person's head is above his or her stomach (upright position). This will prevent choking and discomfort.  Make sure the person is in the right position during and after feedings. ? During feedings, have the person in the upright position. ? After a non-continuous feeding (bolus feeding), have the person stay in the upright position for 1 hour.  Cover and place unused feedings in the refrigerator.  Replace feeding bags and syringes as told. Good hygiene  Make sure the  person takes good care of his or her mouth and teeth (oral hygiene), such as by brushing his or her teeth.  Keep the area where the tube enters the skin clean and dry. General instructions  Use syringes made only for G-tubes.  Do not pull or put tension on the tube.  Before you remove the tube cap or disconnect a syringe, close the tube by using a clamp (clamping) or bending (kinking) the tube.  Measure the length of the G-tube every day from the insertion site to the end of the tube.  If the person's G-tube has a balloon, check the fluid in the balloon every week. Check the manufacturer's specifications to find the amount of fluid that should be in the balloon.  Remove excess air from the G-tube as told. This is called venting.  Do not push feedings, medicines, or flushes fast. Contact a health care provider if:  The person with the tube has constipation or a fever.  A large amount of fluid or mucus-like liquid is leaking from the tube.  Skin or scar tissue appears to be growing where the tube enters the skin.  The length of tube from the insertion site to the G-tube gets longer. Get help right away if:  The person with the tube has any of these problems: ? Severe pain, tenderness, or bloating in the abdomen. ? Nausea or vomiting. ? Trouble breathing or shortness of breath.  Any of these problems happen in the area where the tube enters the skin: ? Redness, irritation, swelling, or soreness. ? Pus-like discharge. ? A bad smell.  The tube is clogged and cannot be flushed.  The tube comes out. The tube will need to put back in within 4 hours. Summary  A gastrostomy tube, or G-tube, is a tube that is inserted through the abdomen into the stomach. The tube is used to give feedings and medicines when a person cannot eat and drink enough on his or her own or cannot take medicine by mouth.  Check and clean the insertion site daily as told by the person's health care  provider.  Flush the G-tube regularly to keep it from clogging. Flush it before and after feedings and as often as told.  Keep the area where the tube enters  the skin clean and dry. This information is not intended to replace advice given to you by your health care provider. Make sure you discuss any questions you have with your health care provider. Document Revised: 06/30/2019 Document Reviewed: 07/03/2019 Elsevier Patient Education  [place].

## 2020-07-29 ENCOUNTER — Telehealth: Payer: Self-pay | Admitting: Licensed Clinical Social Worker

## 2020-07-29 NOTE — Telephone Encounter (Signed)
[place] Work  Clinical Social Work was referred by Engineer, site for assessment of psychosocial needs in patient with newly diagnosed H&N cancer.  Clinical Social Worker attempted to contact patient by phone. No answer. Left VM with direct contact information.    [place], [place] Worker [employer]

## 2020-07-30 ENCOUNTER — Encounter: Payer: Self-pay | Admitting: Hematology and Oncology

## 2020-07-30 ENCOUNTER — Inpatient Hospital Stay: Payer: Medicare Other | Attending: Hematology and Oncology

## 2020-07-30 ENCOUNTER — Other Ambulatory Visit: Payer: Self-pay

## 2020-07-30 DIAGNOSIS — C109 Malignant neoplasm of oropharynx, unspecified: Secondary | ICD-10-CM | POA: Insufficient documentation

## 2020-07-30 DIAGNOSIS — G893 Neoplasm related pain (acute) (chronic): Secondary | ICD-10-CM | POA: Insufficient documentation

## 2020-07-30 DIAGNOSIS — Z5111 Encounter for antineoplastic chemotherapy: Secondary | ICD-10-CM | POA: Insufficient documentation

## 2020-07-30 DIAGNOSIS — K1231 Oral mucositis (ulcerative) due to antineoplastic therapy: Secondary | ICD-10-CM | POA: Insufficient documentation

## 2020-07-30 DIAGNOSIS — Z79899 Other long term (current) drug therapy: Secondary | ICD-10-CM | POA: Insufficient documentation

## 2020-07-30 DIAGNOSIS — C090 Malignant neoplasm of tonsillar fossa: Secondary | ICD-10-CM | POA: Insufficient documentation

## 2020-07-30 NOTE — Progress Notes (Signed)
Met with patient by elevator to introduce myself as Arboriculturist and to offer available resources.  Discussed one-time $1000 Radio broadcast assistant to assist with personal expenses while going through treatment. Advised what is needed to apply.  Gave her my card if interested in applying and for any additional financial questions or concerns.

## 2020-08-02 ENCOUNTER — Encounter: Payer: Self-pay | Admitting: *Deleted

## 2020-08-02 NOTE — Progress Notes (Signed)
[place] Work  Clinical Social Work received referral form psychosocial assessment for newly diagnosed head and neck cancer.  CSW attempted to contact patient by phone.  CSW left patient a voicemail offering support and encouraging patient to return call.  Cathy Knight, MSW, LCSW, OSW-C Clinical Social Worker [facility] [phone]

## 2020-08-03 ENCOUNTER — Inpatient Hospital Stay (HOSPITAL_BASED_OUTPATIENT_CLINIC_OR_DEPARTMENT_OTHER): Payer: Medicare Other | Admitting: Hematology and Oncology

## 2020-08-03 ENCOUNTER — Inpatient Hospital Stay: Payer: Medicare Other

## 2020-08-03 ENCOUNTER — Ambulatory Visit
Admission: RE | Admit: 2020-08-03 | Discharge: 2020-08-03 | Disposition: A | Discharge status: Home / Self Care | Payer: Medicare Other | Source: Ambulatory Visit | Attending: Radiation Oncology | Admitting: Radiation Oncology

## 2020-08-03 ENCOUNTER — Other Ambulatory Visit: Payer: Self-pay

## 2020-08-03 DIAGNOSIS — G893 Neoplasm related pain (acute) (chronic): Secondary | ICD-10-CM | POA: Diagnosis not present

## 2020-08-03 DIAGNOSIS — C090 Malignant neoplasm of tonsillar fossa: Secondary | ICD-10-CM | POA: Insufficient documentation

## 2020-08-03 DIAGNOSIS — C109 Malignant neoplasm of oropharynx, unspecified: Secondary | ICD-10-CM | POA: Insufficient documentation

## 2020-08-03 DIAGNOSIS — Z79899 Other long term (current) drug therapy: Secondary | ICD-10-CM | POA: Diagnosis not present

## 2020-08-03 DIAGNOSIS — K1231 Oral mucositis (ulcerative) due to antineoplastic therapy: Secondary | ICD-10-CM | POA: Diagnosis not present

## 2020-08-03 DIAGNOSIS — Z5111 Encounter for antineoplastic chemotherapy: Secondary | ICD-10-CM | POA: Diagnosis not present

## 2020-08-03 LAB — CBC WITH DIFFERENTIAL (CANCER CENTER ONLY)
Abs Immature Granulocytes: 0.04 10*3/uL (ref 0.00–0.07)
Basophils Absolute: 0.1 10*3/uL (ref 0.0–0.1)
Basophils Relative: 1 %
Eosinophils Absolute: 0.2 10*3/uL (ref 0.0–0.5)
Eosinophils Relative: 2 %
HCT: 40.9 % (ref 36.0–46.0)
Hemoglobin: 13.3 g/dL (ref 12.0–15.0)
Immature Granulocytes: 0 %
Lymphocytes Relative: 21 %
Lymphs Abs: 2.2 10*3/uL (ref 0.7–4.0)
MCH: 31.4 pg (ref 26.0–34.0)
MCHC: 32.5 g/dL (ref 30.0–36.0)
MCV: 96.7 fL (ref 80.0–100.0)
Monocytes Absolute: 0.6 10*3/uL (ref 0.1–1.0)
Monocytes Relative: 6 %
Neutro Abs: 7.1 10*3/uL (ref 1.7–7.7)
Neutrophils Relative %: 70 %
Platelet Count: 267 10*3/uL (ref 150–400)
RBC: 4.23 MIL/uL (ref 3.87–5.11)
RDW: 13.5 % (ref 11.5–15.5)
WBC Count: 10.1 10*3/uL (ref 4.0–10.5)
nRBC: 0 % (ref 0.0–0.2)

## 2020-08-03 LAB — BASIC METABOLIC PANEL - CANCER CENTER ONLY
Anion gap: 13 (ref 5–15)
BUN: 10 mg/dL (ref 8–23)
CO2: 23 mmol/L (ref 22–32)
Calcium: 9.7 mg/dL (ref 8.9–10.3)
Chloride: 104 mmol/L (ref 98–111)
Creatinine: 1.18 mg/dL — ABNORMAL HIGH (ref 0.44–1.00)
GFR, Estimated: 51 mL/min — ABNORMAL LOW (ref >60)
Glucose, Bld: 144 mg/dL — ABNORMAL HIGH (ref 70–99)
Potassium: 3.8 mmol/L (ref 3.5–5.1)
Sodium: 140 mmol/L (ref 135–145)

## 2020-08-03 LAB — MAGNESIUM: Magnesium: 1.8 mg/dL (ref 1.7–2.4)

## 2020-08-03 NOTE — Progress Notes (Signed)
[facility] CONSULT NOTE  Patient Care Team: Pcp, No as PCP - General  CHIEF COMPLAINTS/PURPOSE OF CONSULTATION:  New diagnosis of tonsillar cancer  ASSESSMENT & PLAN:   Squamous cell carcinoma of oropharynx ([place]) This is a very pleasant 70 year old female patient with a newly diagnosed squamous cell carcinoma of the oropharynx, p16 positive referred to medical oncology for recommendations.  She had persistent sore throat and right-sided ear pain which warranted further investigation.  Imaging showed intense uptake in the right tonsil suspicious for malignancy, PET/CT showed uptake in the right palatine tonsil with bilateral level 2A and right level 4 nodes reflecting spread of disease. No evidence of distant metastasis.  Given bilateral lymphadenopathy, we discussed about concurrent chemoradiation. She had her port and G-tube placed.  Labs satisfactory to proceed with first cycle of chemotherapy.  She understands the possible adverse effects, we have discussed this on multiple occasions.  She was reminded to start dexamethasone the day after chemotherapy for 3 days in a row as recommended.  She continues to have pain from malignancy and has to take pain medication about twice a day.  She is having some situational anxiety with anticipated treatment but otherwise doing relatively okay.  We will continue to monitor her every week with labs while she is on chemotherapy and radiation. Cisplatin dose adjusted to 30 mg per metered square given drop in GFR.  We will reevaluate and escalate the dose as needed.  Cancer related pain Oxycodone will be refilled today, severe pain radiating to the ear which requires her to at least use the pain medication twice a day.  This is hopefully expected to improve once we initiate treatment.  Medication refills.  No orders of the defined types were placed in this encounter.   HISTORY OF PRESENTING ILLNESS:   Cathy Knight 69 y.o. female is  here because of SCC oropharynx, P 16 +  Chronology  This is a very pleasant 70 year old female patient with newly diagnosed oropharyngeal cancer referred to medical oncology for recommendations.  During her last visit we discussed about concurrent chemoradiation given bilateral disease.  Interval History  Cathy Knight is here for a follow up. Since her last visit she had her port and G-tube placed.  There is some soreness around the port and G-tube.  She has been taking oxycodone twice a day for pain from the malignancy which is mostly radiating to her ear and causes severe discomfort.  She is able to swallow and eat everything.  She is understandably anxious about the treatment schedule.  She denies any change in breathing, bowel habits or urinary habits.  Rest of the pertinent 10 point ROS reviewed and negative  MEDICAL HISTORY:  No past medical history on file.  SURGICAL HISTORY: Past Surgical History:  Procedure Laterality Date  . IR GASTROSTOMY TUBE MOD SED  07/28/2020  . IR IMAGING GUIDED PORT INSERTION  07/28/2020    SOCIAL HISTORY: Social History   Socioeconomic History  . Marital status: Single    Spouse name: Not on file  . Number of children: Not on file  . Years of education: Not on file  . Highest education level: Not on file  Occupational History  . Not on file  Tobacco Use  . Smoking status: Current Every Day Smoker    Packs/day: 1.00    Years: 15.00    Pack years: 15.00    Types: Cigarettes  . Smokeless tobacco: Never Used  Substance and Sexual Activity  .  Alcohol use: Not on file  . Drug use: Not on file  . Sexual activity: Not on file  Other Topics Concern  . Not on file  Social History Narrative  . Not on file   Social Determinants of Health   Financial Resource Strain: Not on file  Food Insecurity: Not on file  Transportation Needs: Not on file  Physical Activity: Not on file  Stress: Not on file  Social Connections: Not on file  Intimate Partner  Violence: Not on file    FAMILY HISTORY: No family history on file.  ALLERGIES:  is allergic to atorvastatin.  MEDICATIONS:  Current Outpatient Medications  Medication Sig Dispense Refill  . albuterol (VENTOLIN HFA) 108 (90 Base) MCG/ACT inhaler Inhale 1-2 puffs into the lungs every 6 (six) hours as needed for wheezing or shortness of breath.    Marland Kitchen amLODipine (NORVASC) 5 MG tablet Take 5 mg by mouth daily.    Marland Kitchen aspirin 81 MG chewable tablet Chew 81 mg by mouth daily.    Marland Kitchen buPROPion (WELLBUTRIN SR) 150 MG 12 hr tablet Start one week before quit date. Take 1 tab daily x 3 days, then 1 tab BID thereafter. (Patient taking differently: Take 150 mg by mouth daily.) 60 tablet 2  . calcium citrate-vitamin D (CITRACAL+D) 315-200 MG-UNIT tablet Take 1 tablet by mouth 2 (two) times daily.    . carvedilol (COREG) 6.25 MG tablet Take 6.25 mg by mouth 2 (two) times daily with a meal.    . Cholecalciferol (VITAMIN D3) 125 MCG (5000 UT) CAPS Take 5,000 Units by mouth daily.    Marland Kitchen co-enzyme Q-10 30 MG capsule Take 30 mg by mouth daily.    . cyclobenzaprine (FLEXERIL) 10 MG tablet Take 10 mg by mouth 3 (three) times daily as needed for muscle spasms.    Marland Kitchen dexamethasone (DECADRON) 4 MG tablet Take 2 tablets (8 mg total) by mouth daily. Take daily x 3 days starting the day after cisplatin chemotherapy. Take with food. 30 tablet 1  . Evolocumab (REPATHA SURECLICK) 378 MG/ML SOAJ Inject 140 mg into the skin every 14 (fourteen) days.    . Flaxseed, Linseed, (FLAXSEED OIL) 1000 MG CAPS Take 2,000 mg by mouth daily.    Marland Kitchen lidocaine-prilocaine (EMLA) cream Apply to affected area once 30 g 3  . lisinopril (ZESTRIL) 20 MG tablet Take 1 tablet by mouth daily.    . Multiple Vitamins-Minerals (MULTIVITAMIN WOMEN 50+ PO) Take 1 tablet by mouth daily.    . nicotine (NICODERM CQ - DOSED IN MG/24 HOURS) 21 mg/24hr patch Place 21 mg onto the skin daily.    . Nutritional Supplements (FEEDING SUPPLEMENT, OSMOLITE 1.5 CAL,) LIQD  Osmolite 1.5 x 6 cartons/day - Give 1 1/2 cartons via PEG QID. Flush tube with 60 ml water before and after each feeding. Drink by mouth or give via tube additional 720 ml (3c.) water daily. 1422 mL 0  . omeprazole (PRILOSEC) 20 MG capsule Take 20 mg by mouth daily.    . ondansetron (ZOFRAN) 8 MG tablet Take 1 tablet (8 mg total) by mouth 2 (two) times daily as needed. Start on the third day after cisplatin chemotherapy. 30 tablet 1  . potassium chloride (KLOR-CON) 10 MEQ tablet Take 10 mEq by mouth daily.    . predniSONE (DELTASONE) 20 MG tablet Take 20 mg by mouth daily as needed (COPD).    Marland Kitchen prochlorperazine (COMPAZINE) 10 MG tablet Take 1 tablet (10 mg total) by mouth every 6 (six) hours  as needed (Nausea or vomiting). 30 tablet 1  . sharps container Use as directed for sharps disposal    . SPIRIVA RESPIMAT 2.5 MCG/ACT AERS Inhale 2 puffs into the lungs daily.    . vitamin B-12 (CYANOCOBALAMIN) 500 MCG tablet Take 500 mcg by mouth daily.    . Oxycodone HCl 10 MG TABS Take 1 tablet (10 mg total) by mouth 2 (two) times daily as needed for up to 60 doses (cancer pain). 60 tablet 0   No current facility-administered medications for this visit.   Facility-Administered Medications Ordered in Other Visits  Medication Dose Route Frequency Provider Last Rate Last Admin  . 0.9 %  sodium chloride infusion   Intravenous Once Nature Kueker, MD      . 0.9 % NaCl with KCl 20 mEq/ L  infusion   Intravenous Once Brandell Maready, MD      . CISplatin (PLATINOL) 56 mg in sodium chloride 0.9 % 250 mL chemo infusion  30 mg/m2 (Treatment Plan Recorded) Intravenous Once Chamille Werntz, Arletha Pili, MD      . dexamethasone (DECADRON) 10 mg in sodium chloride 0.9 % 50 mL IVPB  10 mg Intravenous Once Manasvini Whatley, MD      . fosaprepitant (EMEND) 150 mg in sodium chloride 0.9 % 145 mL IVPB  150 mg Intravenous Once Jazmina Muhlenkamp, MD      . heparin lock flush 100 unit/mL  500 Units Intracatheter Once PRN Alesia Oshields, MD       . magnesium sulfate IVPB 2 g 50 mL  2 g Intravenous Once Carnell Beavers, MD      . palonosetron (ALOXI) injection 0.25 mg  0.25 mg Intravenous Once Hephzibah Strehle, MD      . sodium chloride flush (NS) 0.9 % injection 10 mL  10 mL Intracatheter PRN Cheyna Retana, MD        PHYSICAL EXAMINATION:  ECOG PERFORMANCE STATUS: 1 - Symptomatic but completely ambulatory  Vitals:   08/03/20 1230  BP: (!) 146/74  Pulse: 96  Resp: 20  Temp: (!) 97.5 F (36.4 C)  SpO2: 100%   Filed Weights   08/03/20 1230  Weight: 160 lb 3.2 oz (72.7 kg)   Physical Exam Constitutional:      Appearance: Normal appearance.  HENT:     Head: Normocephalic and atraumatic.  Neck:     Comments: Neck fullness but no def palpable LN Cardiovascular:     Rate and Rhythm: Normal rate and regular rhythm.     Pulses: Normal pulses.     Heart sounds: Normal heart sounds.  Pulmonary:     Effort: Pulmonary effort is normal.     Breath sounds: Normal breath sounds.  Abdominal:     General: Abdomen is flat. Bowel sounds are normal.     Palpations: Abdomen is soft.  Musculoskeletal:     Cervical back: Normal range of motion and neck supple.  Lymphadenopathy:     Cervical: No cervical adenopathy.  Skin:    General: Skin is warm and dry.  Neurological:     General: No focal deficit present.     Mental Status: She is alert.  Psychiatric:        Mood and Affect: Mood normal.    LABORATORY DATA:  I have reviewed the data as listed Lab Results  Component Value Date   WBC 10.1 08/03/2020   HGB 13.3 08/03/2020   HCT 40.9 08/03/2020   MCV 96.7 08/03/2020   PLT 267 08/03/2020  Chemistry      Component Value Date/Time   NA 140 08/03/2020 1209   K 3.8 08/03/2020 1209   CL 104 08/03/2020 1209   CO2 23 08/03/2020 1209   BUN 10 08/03/2020 1209   CREATININE 1.18 (H) 08/03/2020 1209      Component Value Date/Time   CALCIUM 9.7 08/03/2020 1209   ALKPHOS 55 07/13/2020 1239   AST 14 (L) 07/13/2020 1239    ALT 8 07/13/2020 1239   BILITOT 0.3 07/13/2020 1239       In situ study for HPV 16/18 is negative. Slides will ne sent to [facility] for a broader HR HPV panel  Addendum electronically signed by Isac Caddy Askin, MD on 06/18/2020 at 5:04 PM  Addendum    The p16 immunostain is positive. HR HPV study ordered.  Addendum electronically signed by Isac Caddy Askin, MD on 06/18/2020 at 1:57 PM  Diagnosis    A: Throat, right, biopsy - Fragments of keratinizing well differentiated squamous carcinoma.  A single focus of submucosa with invasive carcinoma is present   Addendum 3    Noted 07/01/2000: Tissue was sent to [facility] for a broad HR HPV ISH study.  Results were: Negative.  The controls were appropriate      RADIOGRAPHIC STUDIES: I have personally reviewed the radiological images as listed and agreed with the findings in the report. IR Gastrostomy Tube  Result Date: 07/28/2020 INDICATION: 70 year old female with oro pharyngeal cancer. She presents for percutaneous gastrostomy tube in preparation for possible radiation esophagitis and dysphagia during treatment. EXAM: Fluoroscopically guided placement of percutaneous balloon retention gastrostomy tube Interventional Radiologist:  Criselda Peaches, MD MEDICATIONS: None. ANESTHESIA/SEDATION: Versed 2 mg IV; Fentanyl 50 mcg IV Moderate Sedation Time:  22 minutes The patient was continuously monitored during the procedure by the interventional radiology nurse under my direct supervision. CONTRAST:  66mL OMNIPAQUE IOHEXOL 300 MG/ML  SOLN FLUOROSCOPY TIME:  Fluoroscopy Time: 4 minutes 0 seconds (22 mGy). COMPLICATIONS: None immediate. PROCEDURE: Informed written consent was obtained from the patient after a thorough discussion of the procedural risks, benefits and alternatives. All questions were addressed. Maximal Sterile Barrier Technique was utilized including caps, mask, sterile gowns, sterile gloves, sterile drape, hand hygiene  and skin antiseptic. A timeout was performed prior to the initiation of the procedure. Maximal barrier sterile technique utilized including caps, mask, sterile gowns, sterile gloves, large sterile drape, hand hygiene, and chlorhexadine skin prep. An angled catheter was advanced over a wire under fluoroscopic guidance through the nose, down the esophagus and into the body of the stomach. The stomach was then insufflated with several 100 ml of air. Fluoroscopy confirmed location of the gastric bubble, as well as inferior displacement of the barium stained colon. Under direct fluoroscopic guidance, a single T-tack was placed, and the anterior gastric wall drawn up against the anterior abdominal wall. Percutaneous access was then obtained into the mid gastric body with an 18 gauge sheath needle. Aspiration of air, and injection of contrast material under fluoroscopy confirmed needle placement. An Amplatz wire was advanced in the gastric body and the access needle exchanged for a 9-French vascular sheath. A snare device was advanced through the vascular sheath and an Amplatz wire advanced through the angled catheter. There was a difficulty in snaring the wire from within the stomach. Given difficulty maintaining insufflation of the stomach, the decision was made to proceed with placement of a balloon retention gastrostomy tube with the assistance of balloon expansion. Therefore, the 9 [place] sheath  was removed and a 10 x 4 [employer] balloon was advanced over the wire and positioned across the tract from the abdominal wall surface into the stomach. A 20 French Entuit gastrostomy tube was also positioned on the balloon. The balloon was inflated and then the balloon and gastrostomy tube were pushed as a unit into the stomach. The retention balloon was filled with 18 mL saline and pulled back snug against the anterior abdominal wall. The external bumper was fixed in place. Contrast injection through the tube confirms tube  placement within the stomach. The tube was then flushed with saline. Hand injection of contrast material confirmed intragastric location. The T-tack retention suture was then cut. The patient will be observed for several hours with the newly placed tube on low wall suction to evaluate for any post procedure complication. The patient tolerated the procedure well, there is no immediate complication. IMPRESSION: Successful placement of a 20 French balloon retention gastrostomy tube. Electronically Signed   By: Jacqulynn Cadet M.D.   On: 07/28/2020 14:59   IR IMAGING GUIDED PORT INSERTION  Result Date: 07/28/2020 INDICATION: 70 year old female with newly diagnosed oro pharyngeal cancer. She presents for port catheter placement to establish durable venous access prior to chemotherapy. EXAM: IMPLANTED PORT A CATH PLACEMENT WITH ULTRASOUND AND FLUOROSCOPIC GUIDANCE MEDICATIONS: None ANESTHESIA/SEDATION: Versed 3 mg IV; Fentanyl 100 mcg IV; Moderate Sedation Time:  18 minutes The patient was continuously monitored during the procedure by the interventional radiology nurse under my direct supervision. FLUOROSCOPY TIME:  0 minutes, 6 seconds (2 mGy) COMPLICATIONS: None immediate. PROCEDURE: The right neck and chest was prepped with chlorhexidine, and draped in the usual sterile fashion using maximum barrier technique (cap and mask, sterile gown, sterile gloves, large sterile sheet, hand hygiene and cutaneous antiseptic). Local anesthesia was attained by infiltration with 1% lidocaine with epinephrine. Ultrasound demonstrated patency of the right internal jugular vein, and this was documented with an image. Under real-time ultrasound guidance, this vein was accessed with a 21 gauge micropuncture needle and image documentation was performed. A small dermatotomy was made at the access site with an 11 scalpel. A 0.018" wire was advanced into the SVC and the access needle exchanged for a 21F micropuncture vascular sheath. The  0.018" wire was then removed and a 0.035" wire advanced into the IVC. An appropriate location for the subcutaneous reservoir was selected below the clavicle and an incision was made through the skin and underlying soft tissues. The subcutaneous tissues were then dissected using a combination of blunt and sharp surgical technique and a pocket was formed. A single lumen power injectable portacatheter was then tunneled through the subcutaneous tissues from the pocket to the dermatotomy and the port reservoir placed within the subcutaneous pocket. The venous access site was then serially dilated and a peel away vascular sheath placed over the wire. The wire was removed and the port catheter advanced into position under fluoroscopic guidance. The catheter tip is positioned in the superior cavoatrial junction. This was documented with a spot image. The portacatheter was then tested and found to flush and aspirate well. The port was flushed with saline followed by 100 units/mL heparinized saline. The pocket was then closed in two layers using first subdermal inverted interrupted absorbable sutures followed by a running subcuticular suture. The epidermis was then sealed with Dermabond. The dermatotomy at the venous access site was also closed with Dermabond. IMPRESSION: Successful placement of a right IJ approach Power Port with ultrasound and fluoroscopic guidance. The catheter is  ready for use. Electronically Signed   By: Jacqulynn Cadet M.D.   On: 07/28/2020 14:55    All questions were answered. The patient knows to call the clinic with any problems, questions or concerns.     Benay Pike, MD 08/04/2020 9:10 AM

## 2020-08-04 ENCOUNTER — Encounter: Payer: Self-pay | Admitting: General Practice

## 2020-08-04 ENCOUNTER — Inpatient Hospital Stay: Payer: Medicare Other | Admitting: Nutrition

## 2020-08-04 ENCOUNTER — Encounter: Payer: Self-pay | Admitting: Hematology and Oncology

## 2020-08-04 ENCOUNTER — Inpatient Hospital Stay: Payer: Medicare Other

## 2020-08-04 VITALS — BP 139/80 | HR 81 | Temp 98.3°F | Resp 18

## 2020-08-04 DIAGNOSIS — Z5111 Encounter for antineoplastic chemotherapy: Secondary | ICD-10-CM | POA: Diagnosis not present

## 2020-08-04 DIAGNOSIS — C090 Malignant neoplasm of tonsillar fossa: Secondary | ICD-10-CM | POA: Diagnosis not present

## 2020-08-04 DIAGNOSIS — C109 Malignant neoplasm of oropharynx, unspecified: Secondary | ICD-10-CM

## 2020-08-04 MED ORDER — PALONOSETRON HCL INJECTION 0.25 MG/5ML
0.2500 mg | Freq: Once | INTRAVENOUS | Status: AC
  Administered 2020-08-04: 0.25 mg via INTRAVENOUS

## 2020-08-04 MED ORDER — POTASSIUM CHLORIDE IN NACL 20-0.9 MEQ/L-% IV SOLN
Freq: Once | INTRAVENOUS | Status: AC
Start: 2020-08-04 — End: 2020-08-04
  Filled 2020-08-04: qty 1000

## 2020-08-04 MED ORDER — MAGNESIUM SULFATE 2 GM/50ML IV SOLN
INTRAVENOUS | Status: AC
  Filled 2020-08-04: qty 50

## 2020-08-04 MED ORDER — HEPARIN SOD (PORK) LOCK FLUSH 100 UNIT/ML IV SOLN
500.0000 [IU] | Freq: Once | INTRAVENOUS | Status: AC | PRN
  Administered 2020-08-04: 500 [IU]
  Filled 2020-08-04: qty 5

## 2020-08-04 MED ORDER — OXYCODONE HCL 10 MG PO TABS: 10.0000 mg | ORAL_TABLET | Freq: Two times a day (BID) | ORAL | 0 refills | Status: DC | PRN

## 2020-08-04 MED ORDER — SODIUM CHLORIDE 0.9 % IV SOLN
30.0000 mg/m2 | Freq: Once | INTRAVENOUS | Status: AC
  Administered 2020-08-04: 56 mg via INTRAVENOUS
  Filled 2020-08-04: qty 56

## 2020-08-04 MED ORDER — PALONOSETRON HCL INJECTION 0.25 MG/5ML
INTRAVENOUS | Status: AC
  Filled 2020-08-04: qty 5

## 2020-08-04 MED ORDER — FOSAPREPITANT DIMEGLUMINE INJECTION 150 MG
150.0000 mg | Freq: Once | INTRAVENOUS | Status: AC
  Administered 2020-08-04: 150 mg via INTRAVENOUS
  Filled 2020-08-04: qty 150

## 2020-08-04 MED ORDER — MAGNESIUM SULFATE 2 GM/50ML IV SOLN
2.0000 g | Freq: Once | INTRAVENOUS | Status: AC
Start: 2020-08-04 — End: 2020-08-04
  Administered 2020-08-04: 2 g via INTRAVENOUS

## 2020-08-04 MED ORDER — SODIUM CHLORIDE 0.9 % IV SOLN
Freq: Once | INTRAVENOUS | Status: AC
  Filled 2020-08-04: qty 250

## 2020-08-04 MED ORDER — SODIUM CHLORIDE 0.9 % IV SOLN
10.0000 mg | Freq: Once | INTRAVENOUS | Status: AC
  Administered 2020-08-04: 10 mg via INTRAVENOUS
  Filled 2020-08-04: qty 10

## 2020-08-04 MED ORDER — SODIUM CHLORIDE 0.9 % IV SOLN: Freq: Once | INTRAVENOUS | Status: DC

## 2020-08-04 MED ORDER — SODIUM CHLORIDE 0.9% FLUSH
10.0000 mL | INTRAVENOUS | Status: DC | PRN
  Administered 2020-08-04: 10 mL
  Filled 2020-08-04: qty 10

## 2020-08-04 NOTE — Progress Notes (Signed)
[facility] Work  Initial Assessment   Jeanne Diefendorf is a 70 y.o. year old female seen in infusion area. Clinical Social Work was referred by oncology treatment team for assessment of psychosocial needs.   SDOH (Social Determinants of Health) assessments performed: Yes SDOH Interventions   Flowsheet Row Most Recent Value  SDOH Interventions   Food Insecurity Interventions Intervention Not Indicated  Financial Strain Interventions Intervention Not Indicated  Housing Interventions Intervention Not Indicated  Transportation Interventions Intervention Not Indicated      Distress Screen completed: No No flowsheet data found.    Family/Social Information:  . Housing Arrangement: patient lives alone, moved to [place] and initially lived w sister while new to the area.  Has been in [place] for several years, moved from [place] where she had worked for a number of years . Family members/support persons in your life? Has built good support system in this area, expressed that she has multiple friends on whom she can call for various needs . Transportation concerns: plans to drive herself as long as possible, then has friends available to drive  . 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 Income source: Fish farm manager . Financial concerns: No, but cost of gas could become a concern due to need for daily treatments o Type of concern: Transportation . Food access concerns: none . Religious or spiritual practice: did nto ask . Medication Concerns: none expressed . Services Currently in place:  none  Coping/ Adjustment to diagnosis: . Patient understands treatment plan and what happens next? Newly diagnosed with tonsillar cancer, will start 7 week course of concurrent chemoradiation.  Diagnosed after period of severe throat and ear pain.   . Concerns about diagnosis and/or treatment: Overwhelmed by information and  situational anxiety due to initiation of treatment . Patient reported stressors: Adjusting to my illness . Ability for insight, Average or above average intelligence, Capable of independent living, Communication skills, General fund of knowledge, Motivation for treatment/growth and Supportive family/friends    SUMMARY: Current SDOH Barriers:  . none noted  Clinical Social Work Clinical Goal(s):  . patient will work with SW to address concerns related to treatment as issues arise  Interventions: . Discussed common feeling and emotions when being diagnosed with cancer, and the importance of support during treatment . Informed patient of the support team roles and support services at [facility] . Provided CSW contact information and encouraged patient to call with any questions or concerns . Provided patient with information about [facility] and [employer]   Follow Up Plan: CSW will see patient on scheduled appointment at [facility] Patient verbalizes understanding of plan: Yes    Beverely Pace , [place], [place] Worker Phone:  [phone]

## 2020-08-04 NOTE — Assessment & Plan Note (Addendum)
This is a very pleasant 70 year old female patient with a newly diagnosed squamous cell carcinoma of the oropharynx, p16 positive referred to medical oncology for recommendations.  She had persistent sore throat and right-sided ear pain which warranted further investigation.  Imaging showed intense uptake in the right tonsil suspicious for malignancy, PET/CT showed uptake in the right palatine tonsil with bilateral level 2A and right level 4 nodes reflecting spread of disease. No evidence of distant metastasis.  Given bilateral lymphadenopathy, we discussed about concurrent chemoradiation. She had her port and G-tube placed.  Labs satisfactory to proceed with first cycle of chemotherapy.  She understands the possible adverse effects, we have discussed this on multiple occasions.  She was reminded to start dexamethasone the day after chemotherapy for 3 days in a row as recommended.  She continues to have pain from malignancy and has to take pain medication about twice a day.  She is having some situational anxiety with anticipated treatment but otherwise doing relatively okay.  We will continue to monitor her every week with labs while she is on chemotherapy and radiation. Cisplatin dose adjusted to 30 mg per metered square given drop in GFR.  We will reevaluate and escalate the dose as needed.

## 2020-08-04 NOTE — Patient Instructions (Signed)
[facility]  Discharge Instructions: Thank you for choosing [facility] to provide your oncology and hematology care.   If you have a lab appointment with the Cancer Center, please go directly to the Cancer Center and check in at the registration area.   Wear comfortable clothing and clothing appropriate for easy access to any Portacath or PICC line.   We strive to give you quality time with your provider. You may need to reschedule your appointment if you arrive late (15 or more minutes).  Arriving late affects you and other patients whose appointments are after yours.  Also, if you miss three or more appointments without notifying the office, you may be dismissed from the clinic at the provider's discretion.      For prescription refill requests, have your pharmacy contact our office and allow 72 hours for refills to be completed.    Today you received the following chemotherapy and/or immunotherapy agents cisplatin   To help prevent nausea and vomiting after your treatment, we encourage you to take your nausea medication as directed.  BELOW ARE SYMPTOMS THAT SHOULD BE REPORTED IMMEDIATELY: *FEVER GREATER THAN 100.4 F (38 C) OR HIGHER *CHILLS OR SWEATING *NAUSEA AND VOMITING THAT IS NOT CONTROLLED WITH YOUR NAUSEA MEDICATION *UNUSUAL SHORTNESS OF BREATH *UNUSUAL BRUISING OR BLEEDING *URINARY PROBLEMS (pain or burning when urinating, or frequent urination) *BOWEL PROBLEMS (unusual diarrhea, constipation, pain near the anus) TENDERNESS IN MOUTH AND THROAT WITH OR WITHOUT PRESENCE OF ULCERS (sore throat, sores in mouth, or a toothache) UNUSUAL RASH, SWELLING OR PAIN  UNUSUAL VAGINAL DISCHARGE OR ITCHING   Items with * indicate a potential emergency and should be followed up as soon as possible or go to the Emergency Department if any problems should occur.  Please show the CHEMOTHERAPY ALERT CARD or IMMUNOTHERAPY ALERT CARD at check-in to the  Emergency Department and triage nurse.  Should you have questions after your visit or need to cancel or reschedule your appointment, please contact [facility]  Dept: [phone]  and follow the prompts.  Office hours are 8:00 a.m. to 4:30 p.m. Monday - Friday. Please note that voicemails left after 4:00 p.m. may not be returned until the following business day.  We are closed weekends and major holidays. You have access to a nurse at all times for urgent questions. Please call the main number to the clinic Dept: [phone] and follow the prompts.   For any non-urgent questions, you may also contact your provider using MyChart. We now offer e-Visits for anyone 18 and older to request care online for non-urgent symptoms. For details visit [email or web address].   Also download the MyChart app! Go to the app store, search "MyChart", open the app, select [facility], and log in with your MyChart username and password.  Due to Covid, a mask is required upon entering the hospital/clinic. If you do not have a mask, one will be given to you upon arrival. For doctor visits, patients may have 1 support person aged 18 or older with them. For treatment visits, patients cannot have anyone with them due to current Covid guidelines and our immunocompromised population.   

## 2020-08-04 NOTE — Assessment & Plan Note (Signed)
Oxycodone will be refilled today, severe pain radiating to the ear which requires her to at least use the pain medication twice a day.  This is hopefully expected to improve once we initiate treatment.  Medication refills.

## 2020-08-04 NOTE — Progress Notes (Signed)
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  She is receiving concurrent chemoradiation therapy with cisplatin.  Patient status post PEG/port placement on June 1.  Patient continues to have a sore throat which is likely related to the tumor.  MD hopeful this will improve with further treatment.  Patient denies difficulty eating.  States she always only eats 1 meal a day but has been trying to eat more often throughout the day.  Weight is stable and documented as 160.2 pounds June 7.  Patient currently denying nutrition impact symptoms including nausea, vomiting, constipation, and diarrhea.  Patient complains feeding tube is very tight and uncomfortable.  She notes she has no clamp on tube and was told someone would find a different end to put on the tube for easier tube feeding initiation.  Estimated nutrition needs: 2070-2215 cal, 110-125 g protein, 2.2 L fluid.  Nutrition diagnosis: Predicted suboptimal energy intake continues.  Intervention: Encourage patient to continue PEG care management including cleaning and flushing tube daily.  Contacted RN navigator to assist with PEG.  Patient needs a clamp or a stopcock on the end and would be more comfortable if tube was just a little looser. Continue increased calories and protein intake throughout the day for weight maintenance.  Monitoring, evaluation, goals: Patient will tolerate adequate calories and protein to minimize weight loss.  Next visit: Tuesday, June 14 during infusion.  **Disclaimer: This note was dictated with voice recognition software. Similar sounding words can inadvertently be transcribed and this note may contain transcription errors which may not have been corrected upon publication of note.**

## 2020-08-05 ENCOUNTER — Ambulatory Visit
Admission: RE | Admit: 2020-08-05 | Discharge: 2020-08-05 | Disposition: A | Discharge status: Home / Self Care | Payer: Medicare Other | Source: Ambulatory Visit | Attending: Radiation Oncology | Admitting: Radiation Oncology

## 2020-08-05 ENCOUNTER — Other Ambulatory Visit: Payer: Self-pay

## 2020-08-05 ENCOUNTER — Telehealth: Payer: Self-pay | Admitting: *Deleted

## 2020-08-05 DIAGNOSIS — C090 Malignant neoplasm of tonsillar fossa: Secondary | ICD-10-CM | POA: Diagnosis not present

## 2020-08-05 NOTE — Progress Notes (Signed)
Oncology Nurse Navigator Documentation   To provide support, encouragement and care continuity, met with Ms. Cathy Knight during her initial chemotherapy and after her first radiation treatment.   I reviewed the 2-step treatment process, answered questions.  Ms. Cathy Knight completed treatment without difficulty, denied questions/concerns. I reviewed the registration/arrival procedure for subsequent treatments. I encouraged her to call me with questions/concerns as tmts proceed.   Harlow Asa RN, BSN, OCN Head & Neck Oncology Nurse [facility] Phone # [phone]  Fax # [phone]

## 2020-08-06 ENCOUNTER — Ambulatory Visit: Payer: Medicare Other | Attending: Radiation Oncology

## 2020-08-06 ENCOUNTER — Ambulatory Visit
Admission: RE | Admit: 2020-08-06 | Discharge: 2020-08-06 | Disposition: A | Discharge status: Home / Self Care | Payer: Medicare Other | Source: Ambulatory Visit | Attending: Radiation Oncology | Admitting: Radiation Oncology

## 2020-08-06 ENCOUNTER — Telehealth: Payer: Self-pay

## 2020-08-06 ENCOUNTER — Other Ambulatory Visit: Payer: Self-pay | Admitting: Hematology and Oncology

## 2020-08-06 DIAGNOSIS — C109 Malignant neoplasm of oropharynx, unspecified: Secondary | ICD-10-CM | POA: Insufficient documentation

## 2020-08-06 DIAGNOSIS — C090 Malignant neoplasm of tonsillar fossa: Secondary | ICD-10-CM | POA: Diagnosis not present

## 2020-08-06 DIAGNOSIS — R293 Abnormal posture: Secondary | ICD-10-CM | POA: Diagnosis present

## 2020-08-06 DIAGNOSIS — R1310 Dysphagia, unspecified: Secondary | ICD-10-CM | POA: Diagnosis not present

## 2020-08-06 MED ORDER — DIPHENOXYLATE-ATROPINE 2.5-0.025 MG PO TABS: 1.0000 | ORAL_TABLET | Freq: Four times a day (QID) | ORAL | 0 refills | Status: DC | PRN

## 2020-08-06 NOTE — Therapy (Signed)
[facility] [address] [place], [place], [address] Phone: [phone]   Fax:  [phone]  Speech Language Pathology Evaluation  Patient Details  Name: Cathy Knight MRN: [id number] Date of Birth: 09-02-1950 No data recorded  Encounter Date: 08/06/2020   End of Session - 08/06/20 1413     Visit Number 1    Number of Visits 4    Date for SLP Re-Evaluation 11/04/20    SLP Start Time 0933    SLP Stop Time  9242    SLP Time Calculation (min) 42 min             No past medical history on file.  Past Surgical History:  Procedure Laterality Date   IR GASTROSTOMY TUBE MOD SED  07/28/2020   IR IMAGING GUIDED PORT INSERTION  07/28/2020    There were no vitals filed for this visit.       SLP Evaluation [facility] - 08/06/20 0950       Oral Motor/Sensory Function   Overall Oral Motor/Sensory Function Impaired    Labial Symmetry Abnormal symmetry left   very mild upon retraction   Labial Strength Within Functional Limits    Labial Coordination WFL    Lingual ROM Within Functional Limits    Lingual Symmetry Abnormal symmetry left   deviaites lt   Lingual Strength Within Functional Limits    Lingual Coordination --   mild reduction   Facial ROM Within Functional Limits              SLP Received On 08/06/20     Referring Provider (SLP) Eppie Gibson, MD    Onset Date     Medical Diagnosis Cancer of tonsillar fossa          Subjective    Subjective Pt tells SLP he takes more time when he swallows now than before.    Patient/Family Stated Goal Maintain WNL swallowing         General Information    HPI Pt with hx of "TIA" however still has some mild issues swallowing liquids if she is not "cognizant" of taking smaller sips. She [place] undergoing chemo-RT due to lesion in tonsillar fossa. Nodal involvement present. Has PEG placed. She started RT on 08-04-20. She had persistent sore throat and right-sided ear pain  which warranted further investigation.  Imaging showed intense uptake in the right tonsil suspicious for malignancy, PET/CT showed uptake in the right palatine tonsil with bilateral level 2A and right level 4 nodes reflecting spread of disease.  Pt had PEG placed, and began chemo/RT 08-04-20. Last day scheduled last week of July 2022.         Prior Functional Status    Cognitive/Linguistic Baseline Within functional limits    Type of Home House     Lives With Spouse         Cognition    Overall Cognitive Status Within Functional Limits for tasks assessed         Auditory Comprehension    Overall Auditory Comprehension Appears within functional limits for tasks assessed         Verbal Expression    Overall Verbal Expression Appears within functional limits for tasks assessed                   Pt currently tolerates mainly regular diet with some softer foods, with thin liquids. Pt rpeorts she has to pay a little more attention after "a TIA" years ago, or  she will have some mild coughing. SLP encouraged pt to pay special attention to this for the next 2-3 months. POs: Pt ate peanut butter crackers and drank water without overt s/s aspiration. Thyroid elevation appeared adequate, and swallows appeared timely Pt's swallow deemed WNL/WFL at this time. Pt reported boluses traveling through oral cavity mostly on rt side.   Because data states the risk for dysphagia during and after radiation treatment is high due to undergoing radiation tx, SLP taught pt about the possibility of reduced/limited ability for PO intake during rad tx. SLP encouraged pt to continue swallowing POs as far into rad tx as possible, even ingesting POs and/or completing HEP shortly after administration of pain meds. Among other modifications for days when pt cannot functionally swallow, SLP talked about performing only non-swallowing tasks on the handout/HEP, and if necessary to cycle through the swallowing portion so the program  of exercises can be completed instead of fatiguing on one of the swallowing exercises and not being able to perform the other swallowing exercises. Pt was then told to add all reps of swallowing tasks back in when it becomes possible to do so.   SLP educated pt re: changes to swallowing musculature after rad tx, and why adherence to dysphagia HEP provided today and PO consumption was necessary to inhibit muscular disuse atrophy and to reduce muscle fibrosis following rad tx. Pt demonstrated understanding of these things to SLP.   SLP then developed a HEP for pt and pt was instructed how to perform exercises involving lingual, vocal, and pharyngeal strengthening. SLP performed each exercise and pt return demonstrated each exercise. SLP ensured pt performance was correct prior to moving on to next exercise. Pt was instructed to complete this program 2 times a day, 6-7 days/week until 6 months after her last rad tx, then x2 a week after that.                                 SLP Education - 08/05/20 1629       Education Details swallow HEP procedure, late effects head/neck radiation on swallow ability    Person(s) Educated Patient    Methods Explanation;Demonstration;Verbal cues    Comprehension Verbalized understanding;Returned demonstration;Verbal cues required;Need further instruction                     SLP Short Term Goals -                 SLP SHORT TERM GOAL #1    Title pt will tell SLP why pt is completing HEP with modified independence    Time 1    Period --   visits, for all STGs    Status New         SLP SHORT TERM GOAL #2    Title pt will complete HEP with rare min A in 2 sessions    Time 2    Status New         SLP SHORT TERM GOAL #3    Title pt will describe 3 overt s/s aspiration PNA with modified independence    Time 3    Status New         SLP SHORT TERM GOAL #4    Title pt will tell SLP how a food journal could hasten return to a more normalized  diet    Time 3    Status New  SLP Long Term Goals -                SLP LONG TERM GOAL #1    Title pt will complete HEP with modified independence over tree visits    Time 4    Period --   visits, for all LTGs    Status New         SLP LONG TERM GOAL #2    Title pt will complete HEP with independence over two visits    Time 5    Status New         SLP LONG TERM GOAL #3    Title pt will describe how to modify HEP over time, and the timeline associated with reduction in HEP frequency with modified independence over two sessions    Time 6    Status New                     Plan -       Clinical Impression Statement At this time pt swallowing is deemed WNL/WFL with regular items (peanut butter crackers) and water. SLP designed an individualized HEP for dysphagia and pt completed each exercise on their own with min cues faded to independent. There are no overt s/s aspiration PNA observed, and none reported by pt at this time. Data indicate that pt's swallow ability will likely decrease over the course of radiation therapy and could very well decline over time following conclusion of their radiation therapy due to muscle disuse atrophy and/or muscle fibrosis. Pt will cont to need to be seen by SLP in order to assess safety of PO intake, assess the need for recommending any objective swallow assessment, and ensuring pt correctly completes the individualized HEP.    Speech Therapy Frequency --   once approx every 4 weeks    Duration --   90 days/12 weeks    Treatment/Interventions Aspiration precaution training;Pharyngeal strengthening exercises;Diet toleration management by SLP;Trials of upgraded texture/liquids;Internal/external aids;Patient/family education;SLP instruction and feedback;Compensatory strategies    Potential to Achieve Goals Good    SLP Home Exercise Plan provided today    Consulted and Agree with Plan of Care Patient                             Patient will benefit from skilled therapeutic intervention in order to improve the following deficits and impairments:   Dysphagia, unspecified type    Problem List Patient Active Problem List   Diagnosis Date Noted   Squamous cell carcinoma of oropharynx ([place] [facility]) 07/13/2020   Cancer related pain 07/13/2020   Cancer of tonsillar fossa ([place]) 07/13/2020   Oropharynx cancer ([place]) 06/29/2020   COPD (chronic obstructive pulmonary disease) ([place]) 04/24/2017   Status epilepticus ([place]) 04/03/2017   Mammogram declined 04/24/2016   Essential hypertension 07/20/2015   Atherosclerosis of native artery of extremity ([place]) 09/29/2011   Mixed hyperlipidemia 09/29/2011   Tobacco use disorder 09/29/2011    [facility] ,[place], CCC-SLP  08/06/2020, 2:14 PM  [facility] [address] [place], [place], [address] Phone: [phone]   Fax:  [phone]  Name: Cathy Knight MRN: [id number] Date of Birth: Aug 02, 1950

## 2020-08-06 NOTE — Telephone Encounter (Signed)
This nurse attempted to reach patient related to diarrhea.  Left a message to return call to clinic.  No questions or concerns at this time.

## 2020-08-07 ENCOUNTER — Other Ambulatory Visit: Payer: Self-pay

## 2020-08-07 ENCOUNTER — Emergency Department (HOSPITAL_COMMUNITY): Payer: Medicare Other

## 2020-08-07 ENCOUNTER — Emergency Department (HOSPITAL_COMMUNITY)
Admission: EM | Admit: 2020-08-07 | Discharge: 2020-08-08 | Disposition: A | Discharge status: Home / Self Care | Payer: Medicare Other | Attending: Emergency Medicine | Admitting: Emergency Medicine

## 2020-08-07 ENCOUNTER — Encounter (HOSPITAL_COMMUNITY): Payer: Self-pay

## 2020-08-07 DIAGNOSIS — F17210 Nicotine dependence, cigarettes, uncomplicated: Secondary | ICD-10-CM | POA: Insufficient documentation

## 2020-08-07 DIAGNOSIS — I10 Essential (primary) hypertension: Secondary | ICD-10-CM | POA: Insufficient documentation

## 2020-08-07 DIAGNOSIS — Z7982 Long term (current) use of aspirin: Secondary | ICD-10-CM | POA: Diagnosis not present

## 2020-08-07 DIAGNOSIS — Z85818 Personal history of malignant neoplasm of other sites of lip, oral cavity, and pharynx: Secondary | ICD-10-CM | POA: Insufficient documentation

## 2020-08-07 DIAGNOSIS — T85848A Pain due to other internal prosthetic devices, implants and grafts, initial encounter: Secondary | ICD-10-CM

## 2020-08-07 DIAGNOSIS — J449 Chronic obstructive pulmonary disease, unspecified: Secondary | ICD-10-CM | POA: Insufficient documentation

## 2020-08-07 DIAGNOSIS — Z79899 Other long term (current) drug therapy: Secondary | ICD-10-CM | POA: Insufficient documentation

## 2020-08-07 DIAGNOSIS — R109 Unspecified abdominal pain: Secondary | ICD-10-CM | POA: Diagnosis present

## 2020-08-07 HISTORY — DX: Essential (primary) hypertension: I10

## 2020-08-07 HISTORY — DX: Malignant (primary) neoplasm, unspecified: C80.1

## 2020-08-07 LAB — BASIC METABOLIC PANEL
Anion gap: 11 (ref 5–15)
BUN: 19 mg/dL (ref 8–23)
CO2: 23 mmol/L (ref 22–32)
Calcium: 9.5 mg/dL (ref 8.9–10.3)
Chloride: 101 mmol/L (ref 98–111)
Creatinine, Ser: 1.12 mg/dL — ABNORMAL HIGH (ref 0.44–1.00)
GFR, Estimated: 54 mL/min — ABNORMAL LOW (ref >60)
Glucose, Bld: 94 mg/dL (ref 70–99)
Potassium: 4.7 mmol/L (ref 3.5–5.1)
Sodium: 135 mmol/L (ref 135–145)

## 2020-08-07 LAB — CBC WITH DIFFERENTIAL/PLATELET
Abs Immature Granulocytes: 0.03 10*3/uL (ref 0.00–0.07)
Basophils Absolute: 0 10*3/uL (ref 0.0–0.1)
Basophils Relative: 0 %
Eosinophils Absolute: 0 10*3/uL (ref 0.0–0.5)
Eosinophils Relative: 0 %
HCT: 43.9 % (ref 36.0–46.0)
Hemoglobin: 14.2 g/dL (ref 12.0–15.0)
Immature Granulocytes: 0 %
Lymphocytes Relative: 8 %
Lymphs Abs: 0.9 10*3/uL (ref 0.7–4.0)
MCH: 32 pg (ref 26.0–34.0)
MCHC: 32.3 g/dL (ref 30.0–36.0)
MCV: 98.9 fL (ref 80.0–100.0)
Monocytes Absolute: 0.5 10*3/uL (ref 0.1–1.0)
Monocytes Relative: 4 %
Neutro Abs: 9.8 10*3/uL — ABNORMAL HIGH (ref 1.7–7.7)
Neutrophils Relative %: 88 %
Platelets: 300 10*3/uL (ref 150–400)
RBC: 4.44 MIL/uL (ref 3.87–5.11)
RDW: 13.7 % (ref 11.5–15.5)
WBC: 11.3 10*3/uL — ABNORMAL HIGH (ref 4.0–10.5)
nRBC: 0 % (ref 0.0–0.2)

## 2020-08-07 MED ORDER — IOHEXOL 9 MG/ML PO SOLN
ORAL | Status: AC
  Administered 2020-08-07: 500 mL
  Filled 2020-08-07: qty 500

## 2020-08-07 NOTE — ED Provider Notes (Signed)
Emergency Medicine Provider Triage Evaluation Note  Cathy Knight , a 70 y.o. female  was evaluated in triage.  Pt complains of pain around the feeding tube.  The feeding tube was placed 10 days ago, needed for carcinoma of the oropharynx.  She had pain at the site that started this afternoon.  The abdominal pain is described as Cramping, severe.  As she flushed her feeding tube this afternoon she noticed blood in the tube.  States that there is surrounding redness around where the tube is placed.  She has noticed pus, drainage when cleaning the tube out.  Last chemo treatment was on Wednesday.  Review of Systems  Positive: Pus, abdominal pain, blood in tube Negative:  fevers, shortness of breath, chest pain.  Physical Exam  BP (!) 178/97 (BP Location: Left Arm)   Pulse 90   Temp 98.6 F (37 C) (Oral)   Resp 16   SpO2 100%  Gen:   Awake, no distress   Resp:  Normal effort  MSK:   Moves extremities without difficulty  Other:  Tube in place with bright red blood.   Medical Decision Making  Medically screening exam initiated at 6:37 PM.  Appropriate orders placed.  Cynde Menard was informed that the remainder of the evaluation will be completed by another provider, this initial triage assessment does not replace that evaluation, and the importance of remaining in the ED until their evaluation is complete.     Sherrill Raring, PA-C 08/07/20 1840    Quintella Reichert, MD 08/08/20 [phone]

## 2020-08-07 NOTE — ED Provider Notes (Signed)
  Provider Note MRN:  [id number]  Arrival date & time: 08/08/20    ED Course and Medical Decision Making  Assumed care from Dr. Ralene Bathe at shift change.  PEG placed 10 days ago, PEG site pain, awaiting CT imaging.  If issues with PEG site may need to consult IR.  Upon my reassessment, we began to discuss management options.  Patient did not want to wait much longer for repeat scan if it was not completely necessary.  I explained that the repeat CT scan was the very safest approach to ensure no signs of significant erosion of the stomach, however I also explained that my concern that we would find something that needs emergent intervention was very low.  Patient is sitting comfortably with normal vital signs, soft abdomen, initial CT scan is without significant pathology.  In the end, with shared decision making, we decided to defer this repeat scan, patient will keep a close eye on her symptoms tomorrow and return if anything worsens.  She has follow-up first thing Monday morning with her cancer team, and I advised her to bring up the symptoms and possibly set up a reevaluation with interventional radiology to assess the PEG tube.  She is able to flush the PEG tube without any pain whatsoever, she does not need the PEG tube for nourishment at this time.  Appropriate for discharge.  Procedures  Final Clinical Impressions(s) / ED Diagnoses     ICD-10-CM   1. Pain around PEG tube site, initial encounter  T85.848A       ED Discharge Orders     None         Discharge Instructions      You were evaluated in the Emergency Department and after careful evaluation, we did not find any emergent condition requiring admission or further testing in the hospital.  Your exam/testing today was overall reassuring.  We recommend close follow-up with your cancer team first thing Monday morning to discuss your symptoms.  You may need your PEG tube reevaluated by the interventional radiologists.  Please  return to the Emergency Department if you experience any worsening of your condition.  Thank you for allowing [place] to be a part of your care.       Barth Kirks. [place] Small, [place] [email or web address]    Maudie Flakes, MD 08/08/20 [phone]

## 2020-08-07 NOTE — ED Triage Notes (Signed)
Pt reports having feeding tube placed about 10 days ago. She endorses pain at the site x3-4 days, and then noticed bleeding in the tube today. Pt has hx of cancer.

## 2020-08-07 NOTE — ED Triage Notes (Addendum)
Initial note charted in error.

## 2020-08-07 NOTE — ED Provider Notes (Signed)
[facility] DEPT Provider Note   CSN: [id number] Arrival date & time: 08/07/20  1812     History Chief Complaint  Patient presents with   Abdominal Pain   Post-op Problem    Cathy Knight is a 70 y.o. female.  The history is provided by the patient and medical records.  Abdominal Pain Cathy Knight is a 70 y.o. female who presents to the Emergency Department complaining of PEG problems.  She had a PEG tube placed 10 days ago (currently undergoing treatment for throat cancer).  She noticed blood in the tube this evening.  Drained clots today when she opened the tube. Flushed this morning without difficulty.  Has had intermittent pain since it was placed.  It was adjusted on Thursday and pain improved.  Yesterday the pain returned.  It is described as a cramping pain, severe in nature.  Nonradiating.    No hematochezia/melena, no nausea, CP/SOB, N/V/D.      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     History reviewed. No pertinent family history.  Social History   Tobacco Use   Smoking status: Every Day    Packs/day: 1.00    Years: 15.00    Pack years: 15.00    Types: Cigarettes    Smokeless tobacco: Never    Home Medications Prior to Admission medications   Medication Sig Start Date End Date Taking? Authorizing Provider  albuterol (VENTOLIN HFA) 108 (90 Base) MCG/ACT inhaler Inhale 1-2 puffs into the lungs every 6 (six) hours as needed for wheezing or shortness of breath.    [provider]  amLODipine (NORVASC) 5 MG tablet Take 5 mg by mouth daily. 07/09/17   [provider]  aspirin 81 MG chewable tablet Chew 81 mg by mouth daily. 04/24/17   [provider]  buPROPion (WELLBUTRIN SR) 150 MG 12 hr tablet Start one week before quit date. Take 1 tab daily x 3 days, then 1 tab BID thereafter. Patient taking differently: Take 150 mg by mouth daily. 07/13/20   Eppie Gibson, MD  calcium citrate-vitamin D (CITRACAL+D) 315-200 MG-UNIT tablet Take 1 tablet by mouth 2 (two) times daily.    [provider]  carvedilol (COREG) 6.25 MG tablet Take 6.25 mg by mouth 2 (two) times daily with a meal. 05/10/20   [provider]  Cholecalciferol (VITAMIN D3) 125 MCG (5000 UT) CAPS Take 5,000 Units by mouth daily.    [provider]  co-enzyme Q-10 30 MG capsule Take 30 mg by mouth daily.    [provider]  cyclobenzaprine (FLEXERIL) 10 MG tablet Take 10 mg by mouth 3 (three) times daily as needed for muscle spasms. 10/10/19  [provider]  dexamethasone (DECADRON) 4 MG tablet Take 2 tablets (8 mg total) by mouth daily. Take daily x 3 days starting the day after cisplatin chemotherapy. Take with food. 07/21/20   Benay Pike, MD  diphenoxylate-atropine (LOMOTIL) 2.5-0.025 MG tablet Take 1 tablet by mouth 4 (four) times daily as needed for diarrhea or loose stools. 08/06/20   Benay Pike, MD  Evolocumab (REPATHA SURECLICK) 025 MG/ML SOAJ Inject 140 mg into the skin every 14 (fourteen) days. 02/02/20   [provider]  Flaxseed, Linseed, (FLAXSEED OIL) 1000 MG CAPS Take 2,000 mg by mouth daily.    [provider]  lidocaine-prilocaine (EMLA) cream Apply to affected area once 07/21/20   Iruku, Arletha Pili, MD  lisinopril (ZESTRIL) 20 MG tablet Take 1 tablet by mouth daily. 05/10/20   [provider]  Multiple Vitamins-Minerals (MULTIVITAMIN WOMEN 50+ PO) Take 1 tablet by mouth daily.    [provider]  nicotine (NICODERM CQ - DOSED IN MG/24 HOURS) 21 mg/24hr patch Place 21 mg onto the skin daily. 06/29/20   [provider]  Nutritional Supplements (FEEDING SUPPLEMENT, OSMOLITE 1.5 CAL,) LIQD Osmolite 1.5 x 6 cartons/day - Give 1 1/2 cartons via PEG QID. Flush tube with 60 ml water before and after each feeding. Drink by mouth or give via tube additional 720 ml (3c.) water daily. 07/23/20   Eppie Gibson, MD  omeprazole (PRILOSEC) 20 MG capsule Take 20 mg by mouth daily. 10/31/18   [provider]  ondansetron (ZOFRAN) 8 MG tablet Take 1 tablet (8 mg total) by mouth 2 (two) times daily as needed. Start on the third day after cisplatin chemotherapy. 07/21/20   Benay Pike, MD  Oxycodone HCl 10 MG TABS Take 1 tablet (10 mg total) by mouth 2 (two) times daily as needed for up to 60 doses (cancer pain). 08/04/20   Benay Pike, MD  potassium chloride (KLOR-CON) 10 MEQ tablet Take 10 mEq by mouth daily.    [provider]  predniSONE (DELTASONE) 20 MG tablet Take 20 mg by mouth daily as needed (COPD). 12/24/19   [provider]  prochlorperazine (COMPAZINE) 10 MG tablet Take 1 tablet (10 mg total) by mouth every 6 (six) hours as needed (Nausea or vomiting). 07/21/20   Benay Pike, MD  sharps container Use as directed for sharps disposal 02/09/20   [provider]  SPIRIVA RESPIMAT 2.5 MCG/ACT AERS Inhale 2 puffs into the lungs daily. 07/04/20   [provider]  vitamin B-12 (CYANOCOBALAMIN) 500 MCG tablet Take 500 mcg by mouth daily.    [provider]    Allergies    Atorvastatin  Review of Systems   Review of Systems   Gastrointestinal:  Positive for abdominal pain.  All other systems reviewed and are negative.  Physical Exam Updated Vital Signs BP (!) 153/81 (BP Location: Left Arm)   Pulse 78   Temp 97.9 F (36.6 C) (Oral)   Resp 16   SpO2 100%   Physical Exam Vitals and nursing note reviewed.  Constitutional:      Appearance: She is well-developed.  HENT:     Head: Normocephalic and atraumatic.  Cardiovascular:     Rate and Rhythm: Normal rate and regular rhythm.     Heart sounds: No murmur heard. Pulmonary:     Effort: Pulmonary effort is normal. No respiratory distress.     Breath sounds: Normal breath sounds.  Abdominal:     Palpations: Abdomen is soft.  Tenderness: There is no guarding or rebound.     Comments: Gastrostomy tube in upper abdomen with mild local tenderness.  Small amount of red material and bile in gastrostomy tubing.   Musculoskeletal:        General: No tenderness.  Skin:    General: Skin is warm and dry.  Neurological:     Mental Status: She is alert and oriented to person, place, and time.  Psychiatric:        Behavior: Behavior normal.    ED Results / Procedures / Treatments   Labs (all labs ordered are listed, but only abnormal results are displayed) Labs Reviewed  BASIC METABOLIC PANEL - Abnormal; Notable for the following components:      Result Value   Creatinine, Ser 1.12 (*)    GFR, Estimated 54 (*)    All other components within normal limits  CBC WITH DIFFERENTIAL/PLATELET - Abnormal; Notable for the following components:   WBC 11.3 (*)    Neutro Abs 9.8 (*)    All other components within normal limits    EKG None  Radiology No results found.  Procedures Procedures   Medications Ordered in ED Medications - No data to display  ED Course  I have reviewed the triage vital signs and the nursing notes.  Pertinent labs & imaging results that were available during my care of the patient were reviewed by me and considered in my  medical decision making (see chart for details).    MDM Rules/Calculators/A&P                         patient here for evaluation of pain at her peg site as well as of blood in the tube today. Tube was recently placed. Gastrostomy tube was aspirated in the department with return of pink material consistent with partially digested strawberries. She did eat strawberries last night. Labs with normal hemoglobin. Given her persistent and worsening abdominal pain following gastrostomy placement a CT abdomen pelvis was obtained. CT is concerning for possible erosion through the gastric wall but is not fully clear, plan to obtain CT abdomen pelvis with IV and oral contrast after discussion with radiologist. Patient care transferred pending additional imaging.   Final Clinical Impression(s) / ED Diagnoses Final diagnoses:  None    Rx / DC Orders ED Discharge Orders     None        Quintella Reichert, MD 08/07/20 2358

## 2020-08-08 NOTE — Discharge Instructions (Addendum)
You were evaluated in the Emergency Department and after careful evaluation, we did not find any emergent condition requiring admission or further testing in the hospital.  Your exam/testing today was overall reassuring.  We recommend close follow-up with your cancer team first thing Monday morning to discuss your symptoms.  You may need your PEG tube reevaluated by the interventional radiologists.  Please return to the Emergency Department if you experience any worsening of your condition.  Thank you for allowing [place] to be a part of your care.

## 2020-08-09 ENCOUNTER — Ambulatory Visit: Payer: Medicare Other | Admitting: Hematology and Oncology

## 2020-08-09 ENCOUNTER — Telehealth: Payer: Self-pay

## 2020-08-09 ENCOUNTER — Other Ambulatory Visit: Payer: Medicare Other

## 2020-08-09 ENCOUNTER — Other Ambulatory Visit: Payer: Self-pay

## 2020-08-09 ENCOUNTER — Ambulatory Visit
Admission: RE | Admit: 2020-08-09 | Discharge: 2020-08-09 | Disposition: A | Discharge status: Home / Self Care | Payer: Medicare Other | Source: Ambulatory Visit | Attending: Radiation Oncology | Admitting: Radiation Oncology

## 2020-08-09 ENCOUNTER — Other Ambulatory Visit (HOSPITAL_COMMUNITY): Payer: Self-pay | Admitting: Interventional Radiology

## 2020-08-09 ENCOUNTER — Ambulatory Visit (HOSPITAL_COMMUNITY)
Admission: RE | Admit: 2020-08-09 | Discharge: 2020-08-09 | Disposition: A | Discharge status: Home / Self Care | Payer: Medicare Other | Source: Ambulatory Visit | Attending: Interventional Radiology | Admitting: Interventional Radiology

## 2020-08-09 DIAGNOSIS — Z431 Encounter for attention to gastrostomy: Secondary | ICD-10-CM | POA: Diagnosis not present

## 2020-08-09 DIAGNOSIS — C109 Malignant neoplasm of oropharynx, unspecified: Secondary | ICD-10-CM

## 2020-08-09 DIAGNOSIS — C090 Malignant neoplasm of tonsillar fossa: Secondary | ICD-10-CM | POA: Diagnosis not present

## 2020-08-09 HISTORY — PX: IR CM INJ ANY COLONIC TUBE W/FLUORO: IMG2336

## 2020-08-09 MED ORDER — IOHEXOL 300 MG/ML  SOLN
50.0000 mL | Freq: Once | INTRAMUSCULAR | Status: AC | PRN
  Administered 2020-08-09: 20 mL

## 2020-08-09 MED ORDER — LIDOCAINE VISCOUS HCL 2 % MT SOLN
OROMUCOSAL | Status: AC
  Filled 2020-08-09: qty 15

## 2020-08-09 NOTE — Procedures (Signed)
Interventional Radiology Procedure Note  Procedure: G tube evaluation  Indication: Pain around G tube insertion site  Findings:  Fluoroscopic evaluation of the G tube shows appropriate position within the gastric lumen.  The balloon advanced easily within the gastric lumen to the level of the fundus, confirming that it was not inflated in the wall of the stomach.  Please refer to procedural dictation for full description.  Complications: None  EBL: < 10 mL  Miachel Roux, MD [phone]

## 2020-08-09 NOTE — Progress Notes (Signed)
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 She tolerated first cycle of chemotherapy well.  ROS today fatigue, pain around the G tube, referred pain to the ear. PE today unremarkable, no major concerns.  Given improvement in creatinine, we will bump up the dose of cisplatin to 100 %. I have discussed this with our pharmacy team who will help edit the orders.   Cancer related pain On oxycodone 10 mg PO, takes once or twice a day. No worsening of pain reported today.  G tube feedings [facility] Patient went to ED for pain around G tube. Had some imaging which showed thinning of stomach around the G tube Evaluated by IR, no major concerns. No concerning findings on exam today, mild soreness around the tube, otherwise no acute abdomen.  No orders of the defined types were placed in this encounter.   HISTORY OF PRESENTING ILLNESS:   Cathy Knight 70 y.o. female is here because of SCC oropharynx, P 16 +  Chronology  This is a very pleasant 70 year old female patient with newly diagnosed oropharyngeal cancer referred to medical oncology for recommendations.  During her last visit we discussed about concurrent chemoradiation given bilateral disease.  Interval History  Cathy Knight is here for follow-up prior to week 2 of chemotherapy radiation.  Since her last visit, she had to go to the ER for severe soreness around the G-tube which has been evaluated by interventional radiology since then without any major concerns.  On the image there was some concern for thinning of the  gastric wall via the G-tube but IR is not very concerned about this. She continues to feel fatigued.  She denies any nausea or vomiting.  She has been trying to eat, maintain weight in the past 1 week.  Pain has been stable, she continues to take some pain medication about 1 or 2 a day. Diarrhea has resolved on its own, she did not have to take the Lomotil.  She had to take lactulose yesterday because she was quite backed up from the Imodium use. No change in breathing, urinary habits.  No hearing loss or neuropathy.  MEDICAL HISTORY:  Past Medical History:  Diagnosis Date   Cancer [facility])    Hypertension     SURGICAL HISTORY: Past Surgical History:  Procedure Laterality Date   IR CM INJ ANY COLONIC TUBE W/FLUORO  08/09/2020   IR GASTROSTOMY TUBE MOD SED  07/28/2020   IR IMAGING GUIDED PORT INSERTION  07/28/2020    SOCIAL HISTORY: Social History   Socioeconomic History   Marital status: Single    Spouse name: Not on file   Number of children: Not on file   Years of education: Not on file   Highest education level: Not on file  Occupational History   Not on file  Tobacco Use   Smoking status: Every Day    Packs/day: 1.00    Years: 15.00    Pack years: 15.00    Types: Cigarettes   Smokeless tobacco: Never  Substance and Sexual Activity   Alcohol use: Not on file   Drug use: Not on file  Sexual activity: Not on file  Other Topics Concern   Not on file  Social History Narrative   Not on file   Social Determinants of Health   Financial Resource Strain: Low Risk    Difficulty of Paying Living Expenses: Not very hard  Food Insecurity: No Food Insecurity   Worried About Running Out of Food in the Last Year: Never true   Ran Out of Food in the Last Year: Never true  Transportation Needs: No Transportation Needs   Lack of Transportation (Medical): No   Lack of Transportation (Non-Medical): No  Physical Activity: Not on file  Stress: Not on file  Social Connections: Not on  file  Intimate Partner Violence: Not on file    FAMILY HISTORY: No family history on file.  ALLERGIES:  is allergic to atorvastatin.  MEDICATIONS:  Current Outpatient Medications  Medication Sig Dispense Refill   albuterol (VENTOLIN HFA) 108 (90 Base) MCG/ACT inhaler Inhale 1-2 puffs into the lungs every 6 (six) hours as needed for wheezing or shortness of breath.     aspirin 81 MG chewable tablet Chew 81 mg by mouth daily.     buPROPion (WELLBUTRIN SR) 150 MG 12 hr tablet Start one week before quit date. Take 1 tab daily x 3 days, then 1 tab BID thereafter. (Patient taking differently: Take 150 mg by mouth daily.) 60 tablet 2   calcium citrate-vitamin D (CITRACAL+D) 315-200 MG-UNIT tablet Take 1 tablet by mouth 2 (two) times daily.     carvedilol (COREG) 6.25 MG tablet Take 6.25 mg by mouth 2 (two) times daily with a meal.     Cholecalciferol (VITAMIN D3) 125 MCG (5000 UT) CAPS Take 5,000 Units by mouth daily.     co-enzyme Q-10 30 MG capsule Take 30 mg by mouth daily.     cyclobenzaprine (FLEXERIL) 10 MG tablet Take 10 mg by mouth 3 (three) times daily as needed for muscle spasms.     dexamethasone (DECADRON) 4 MG tablet Take 2 tablets (8 mg total) by mouth daily. Take daily x 3 days starting the day after cisplatin chemotherapy. Take with food. 30 tablet 1   diphenoxylate-atropine (LOMOTIL) 2.5-0.025 MG tablet Take 1 tablet by mouth 4 (four) times daily as needed for diarrhea or loose stools. 30 tablet 0   Flaxseed, Linseed, (FLAXSEED OIL) 1000 MG CAPS Take 2,000 mg by mouth daily.     lidocaine-prilocaine (EMLA) cream Apply to affected area once 30 g 3   lisinopril (ZESTRIL) 20 MG tablet Take 1 tablet by mouth daily.     Multiple Vitamins-Minerals (MULTIVITAMIN WOMEN 50+ PO) Take 1 tablet by mouth daily.     omeprazole (PRILOSEC) 20 MG capsule Take 20 mg by mouth daily.     ondansetron (ZOFRAN) 8 MG tablet Take 1 tablet (8 mg total) by mouth 2 (two) times daily as needed. Start on the  third day after cisplatin chemotherapy. 30 tablet 1   Oxycodone HCl 10 MG TABS Take 1 tablet (10 mg total) by mouth 2 (two) times daily as needed for up to 60 doses (cancer pain). 60 tablet 0   potassium chloride (KLOR-CON) 10 MEQ tablet Take 10 mEq by mouth daily.     predniSONE (DELTASONE) 20 MG tablet Take 20 mg by mouth daily as needed (COPD).     prochlorperazine (COMPAZINE) 10 MG tablet Take 1 tablet (10 mg total) by mouth every 6 (six) hours as needed (Nausea or vomiting). 30 tablet 1   vitamin B-12 (CYANOCOBALAMIN) 500  MCG tablet Take 500 mcg by mouth daily.     amLODipine (NORVASC) 5 MG tablet Take 5 mg by mouth daily.     Evolocumab (REPATHA SURECLICK) 299 MG/ML SOAJ Inject 140 mg into the skin every 14 (fourteen) days.     nicotine (NICODERM CQ - DOSED IN MG/24 HOURS) 21 mg/24hr patch Place 21 mg onto the skin daily.     Nutritional Supplements (FEEDING SUPPLEMENT, OSMOLITE 1.5 CAL,) LIQD Osmolite 1.5 x 6 cartons/day - Give 1 1/2 cartons via PEG QID. Flush tube with 60 ml water before and after each feeding. Drink by mouth or give via tube additional 720 ml (3c.) water daily. 1422 mL 0   sharps container Use as directed for sharps disposal     SPIRIVA RESPIMAT 2.5 MCG/ACT AERS Inhale 2 puffs into the lungs daily.     No current facility-administered medications for this visit.   Facility-Administered Medications Ordered in Other Visits  Medication Dose Route Frequency Provider Last Rate Last Admin   0.9 % NaCl with KCl 20 mEq/ L  infusion   Intravenous Continuous Korbin Notaro, MD 250 mL/hr at 08/10/20 1050 New Bag at 08/10/20 1050   CISplatin (PLATINOL) 74 mg in sodium chloride 0.9 % 250 mL chemo infusion  40 mg/m2 (Treatment Plan Recorded) Intravenous Once Favor Kreh, Arletha Pili, MD       fosaprepitant (EMEND) 150 mg in sodium chloride 0.9 % 145 mL IVPB  150 mg Intravenous Once Demetris Meinhardt, MD       heparin lock flush 100 unit/mL  500 Units Intracatheter Once PRN Violanda Bobeck, Arletha Pili, MD        palonosetron (ALOXI) injection 0.25 mg  0.25 mg Intravenous Once Rollen Selders, MD       sodium chloride flush (NS) 0.9 % injection 10 mL  10 mL Intracatheter PRN Jillianne Gamino, MD        PHYSICAL EXAMINATION:  ECOG PERFORMANCE STATUS: 1 - Symptomatic but completely ambulatory  Vitals:   08/10/20 0948  BP: 119/68  Pulse: 85  Resp: 17  Temp: 98 F (36.7 C)  SpO2: 100%   Filed Weights   08/10/20 0948  Weight: 159 lb 1.6 oz (72.2 kg)   Physical Exam Constitutional:      Appearance: Normal appearance.  HENT:     Head: Normocephalic and atraumatic.  Cardiovascular:     Rate and Rhythm: Normal rate and regular rhythm.     Pulses: Normal pulses.     Heart sounds: Normal heart sounds.  Pulmonary:     Effort: Pulmonary effort is normal.     Breath sounds: Normal breath sounds.  Abdominal:     General: Abdomen is flat. Bowel sounds are normal.     Palpations: Abdomen is soft.  Musculoskeletal:     Cervical back: Normal range of motion and neck supple.  Lymphadenopathy:     Cervical: No cervical adenopathy.  Skin:    General: Skin is warm and dry.  Neurological:     General: No focal deficit present.     Mental Status: She is alert.  Psychiatric:        Mood and Affect: Mood normal.   LABORATORY DATA:  I have reviewed the data as listed Lab Results  Component Value Date   WBC 13.3 (H) 08/10/2020   HGB 13.8 08/10/2020   HCT 40.8 08/10/2020   MCV 93.8 08/10/2020   PLT 329 08/10/2020     Chemistry      Component Value Date/Time   NA  137 08/10/2020 0937   K 4.3 08/10/2020 0937   CL 101 08/10/2020 0937   CO2 26 08/10/2020 0937   BUN 15 08/10/2020 0937   CREATININE 0.96 08/10/2020 0937      Component Value Date/Time   CALCIUM 9.2 08/10/2020 0937   ALKPHOS 55 07/13/2020 1239   AST 14 (L) 07/13/2020 1239   ALT 8 07/13/2020 1239   BILITOT 0.3 07/13/2020 1239       In situ study for HPV 16/18 is negative. Slides will ne sent to [facility] for a  broader HR HPV panel  Addendum electronically signed by Isac Caddy Askin, MD on 06/18/2020 at  5:04 PM  Addendum    The p16 immunostain is positive. HR HPV study ordered.  Addendum electronically signed by Isac Caddy Askin, MD on 06/18/2020 at  1:57 PM  Diagnosis    A: Throat, right, biopsy - Fragments of keratinizing well differentiated squamous carcinoma.  A single focus of submucosa with invasive carcinoma is present   Addendum 3    Noted 07/01/2000: Tissue was sent to [facility] for a broad HR HPV ISH study.  Results were: Negative.  The controls were appropriate      RADIOGRAPHIC STUDIES: I have personally reviewed the radiological images as listed and agreed with the findings in the report. CT Abdomen Pelvis Wo Contrast  Addendum Date: 08/07/2020   ADDENDUM REPORT: 08/07/2020 23:13 ADDENDUM: No fat stranding surrounding the gastric lumen. No organized fluid collection. No gastric wall thickening or pneumatosis. Gas along the tract is nonspecific. Pexied g-tube with T-bar as well as inflated intra-luminal balloon noted. If further concern, recommend CT with intravenous contrast and PO contrast for further evaluation. These results were called by telephone at the time of interpretation on 08/07/2020 at 11:11 pm to provider [facility] , who verbally acknowledged these results. Electronically Signed   By: Iven Finn M.D.   On: 08/07/2020 23:13   Result Date: 08/07/2020 CLINICAL DATA:  Abdominal distension progressive abdominal pain. Cancer tonsils/oropharynx. having feeding tube placed about 10 days ago. She endorses pain at the site x3-4 days, and then noticed bleeding in the tube today. EXAM: CT ABDOMEN AND PELVIS WITHOUT CONTRAST TECHNIQUE: Multidetector CT imaging of the abdomen and pelvis was performed following the standard protocol without IV contrast. COMPARISON:  None. FINDINGS: Lower chest: Bilateral lower lobe subsegmental atelectasis. Mitral annular calcifications.  Coronary artery calcifications. No acute abnormality Hepatobiliary: No focal liver abnormality. No gallstones, gallbladder wall thickening, or pericholecystic fluid. No biliary dilatation. Pancreas: No focal lesion. Normal pancreatic contour. No surrounding inflammatory changes. No main pancreatic ductal dilatation. Spleen: Normal in size without focal abnormality. Adrenals/Urinary Tract: No adrenal nodule bilaterally. No nephrolithiasis, no hydronephrosis. There is a 2.4 x 2.1 cm lesion within the left kidney with a density of 38 Hounsfield units. No ureterolithiasis or hydroureter. The urinary bladder is distended with urine. Stomach/Bowel: Gastrostomy tube within the gastric lumen with however very thin wall around the balloon anteriorly. Question erosion of the gastric wall. Markedly limited evaluation on this noncontrast study. Otherwise the small and large bowel demonstrates no bowel thickening or dilatation. Diffuse sigmoid diverticulosis. No pneumatosis. The appendix is unremarkable. Vascular/Lymphatic: Aortobifem bypass. No abdominal aorta or iliac aneurysm. Severe atherosclerotic plaque of the aorta and its branches. No abdominal, pelvic, or inguinal lymphadenopathy. Reproductive: Atrophic uterus.  Bilateral adnexa are unremarkable. Other: No intraperitoneal free fluid. No intraperitoneal free gas. No organized fluid collection. Musculoskeletal: Couple of tiny fat containing supraumbilical ventral hernias (6: 86-92). No  suspicious lytic or blastic osseous lesions. No acute displaced fracture. Multilevel degenerative changes of the spine. Chronic anterior anterior wedge deformity of the L2 level. IMPRESSION: 1. Gastrostomy tube within the gastric lumen with however very thin wall around the balloon anteriorly. Question erosion of the gastric wall. Markedly limited evaluation on this noncontrast study (IV and PO contrast recommended). 2. Indeterminate 2.4 cm left renal lesion. Malignancy not excluded.  Recommend MRI renal protocol for further evaluation. 3. Urinary bladder distended with urine. 4. Diffuse sigmoid diverticulosis with no acute diverticulitis. 5. Couple of tiny fat containing supraumbilical ventral hernias 6. Aortic Atherosclerosis (ICD10-I70.0) - severe status post aortobifem bypass with markedly limited evaluation on this noncontrast study. Electronically Signed: By: Iven Finn M.D. On: 08/07/2020 22:43   IR Gastrostomy Tube  Result Date: 07/28/2020 INDICATION: 70 year old female with oro pharyngeal cancer. She presents for percutaneous gastrostomy tube in preparation for possible radiation esophagitis and dysphagia during treatment. EXAM: Fluoroscopically guided placement of percutaneous balloon retention gastrostomy tube Interventional Radiologist:  Criselda Peaches, MD MEDICATIONS: None. ANESTHESIA/SEDATION: Versed 2 mg IV; Fentanyl 50 mcg IV Moderate Sedation Time:  22 minutes The patient was continuously monitored during the procedure by the interventional radiology nurse under my direct supervision. CONTRAST:  3mL OMNIPAQUE IOHEXOL 300 MG/ML  SOLN FLUOROSCOPY TIME:  Fluoroscopy Time: 4 minutes 0 seconds (22 mGy). COMPLICATIONS: None immediate. PROCEDURE: Informed written consent was obtained from the patient after a thorough discussion of the procedural risks, benefits and alternatives. All questions were addressed. Maximal Sterile Barrier Technique was utilized including caps, mask, sterile gowns, sterile gloves, sterile drape, hand hygiene and skin antiseptic. A timeout was performed prior to the initiation of the procedure. Maximal barrier sterile technique utilized including caps, mask, sterile gowns, sterile gloves, large sterile drape, hand hygiene, and chlorhexadine skin prep. An angled catheter was advanced over a wire under fluoroscopic guidance through the nose, down the esophagus and into the body of the stomach. The stomach was then insufflated with several 100 ml of  air. Fluoroscopy confirmed location of the gastric bubble, as well as inferior displacement of the barium stained colon. Under direct fluoroscopic guidance, a single T-tack was placed, and the anterior gastric wall drawn up against the anterior abdominal wall. Percutaneous access was then obtained into the mid gastric body with an 18 gauge sheath needle. Aspiration of air, and injection of contrast material under fluoroscopy confirmed needle placement. An Amplatz wire was advanced in the gastric body and the access needle exchanged for a 9-French vascular sheath. A snare device was advanced through the vascular sheath and an Amplatz wire advanced through the angled catheter. There was a difficulty in snaring the wire from within the stomach. Given difficulty maintaining insufflation of the stomach, the decision was made to proceed with placement of a balloon retention gastrostomy tube with the assistance of balloon expansion. Therefore, the 9 French sheath was removed and a 10 x 4 Athletis balloon was advanced over the wire and positioned across the tract from the abdominal wall surface into the stomach. A 20 French Entuit gastrostomy tube was also positioned on the balloon. The balloon was inflated and then the balloon and gastrostomy tube were pushed as a unit into the stomach. The retention balloon was filled with 18 mL saline and pulled back snug against the anterior abdominal wall. The external bumper was fixed in place. Contrast injection through the tube confirms tube placement within the stomach. The tube was then flushed with saline. Hand injection of contrast  material confirmed intragastric location. The T-tack retention suture was then cut. The patient will be observed for several hours with the newly placed tube on low wall suction to evaluate for any post procedure complication. The patient tolerated the procedure well, there is no immediate complication. IMPRESSION: Successful placement of a 20 French  balloon retention gastrostomy tube. Electronically Signed   By: Jacqulynn Cadet M.D.   On: 07/28/2020 14:59   IR Cm Inj Any Colonic Tube W/Fluoro  Result Date: 08/10/2020 INDICATION: 70 year old woman with history of oro pharyngeal malignancy status post 42 French G-tube placement on 07/28/2020 returns with continued pain at insertion site. EXAM: Fluoroscopic gastrostomy tube evaluation. MEDICATIONS: None ANESTHESIA/SEDATION: None CONTRAST:  20 mL Omnipaque 300-administered into the gastric lumen. FLUOROSCOPY TIME:  Fluoroscopy Time: 1 minutes 6 seconds (26 mGy). COMPLICATIONS: None immediate. PROCEDURE: Informed written consent was obtained from the patient after a thorough discussion of the procedural risks, benefits and alternatives. Patient positioned supine on the angiography table. Scout image demonstrated gastrostomy tube in appropriate position. Contrast administered through the tube under fluoroscopy confirmed appropriate positioning within the gastric lumen. The balloon was advanced easily into the gastric lumen confirming that it was not inflated within the wall of the stomach. The balloon was deflated while in the gastric fundus and reinflated with normal saline and retracted to the anterior abdominal wall. IMPRESSION: Fluoroscopic evaluation of gastrostomy tube demonstrates appropriate positioning of the catheter tip and balloon. Electronically Signed   By: Miachel Roux M.D.   On: 08/10/2020 08:55   IR IMAGING GUIDED PORT INSERTION  Result Date: 07/28/2020 INDICATION: 70 year old female with newly diagnosed oro pharyngeal cancer. She presents for port catheter placement to establish durable venous access prior to chemotherapy. EXAM: IMPLANTED PORT A CATH PLACEMENT WITH ULTRASOUND AND FLUOROSCOPIC GUIDANCE MEDICATIONS: None ANESTHESIA/SEDATION: Versed 3 mg IV; Fentanyl 100 mcg IV; Moderate Sedation Time:  18 minutes The patient was continuously monitored during the procedure by the  interventional radiology nurse under my direct supervision. FLUOROSCOPY TIME:  0 minutes, 6 seconds (2 mGy) COMPLICATIONS: None immediate. PROCEDURE: The right neck and chest was prepped with chlorhexidine, and draped in the usual sterile fashion using maximum barrier technique (cap and mask, sterile gown, sterile gloves, large sterile sheet, hand hygiene and cutaneous antiseptic). Local anesthesia was attained by infiltration with 1% lidocaine with epinephrine. Ultrasound demonstrated patency of the right internal jugular vein, and this was documented with an image. Under real-time ultrasound guidance, this vein was accessed with a 21 gauge micropuncture needle and image documentation was performed. A small dermatotomy was made at the access site with an 11 scalpel. A 0.018" wire was advanced into the SVC and the access needle exchanged for a 71F micropuncture vascular sheath. The 0.018" wire was then removed and a 0.035" wire advanced into the IVC. An appropriate location for the subcutaneous reservoir was selected below the clavicle and an incision was made through the skin and underlying soft tissues. The subcutaneous tissues were then dissected using a combination of blunt and sharp surgical technique and a pocket was formed. A single lumen power injectable portacatheter was then tunneled through the subcutaneous tissues from the pocket to the dermatotomy and the port reservoir placed within the subcutaneous pocket. The venous access site was then serially dilated and a peel away vascular sheath placed over the wire. The wire was removed and the port catheter advanced into position under fluoroscopic guidance. The catheter tip is positioned in the superior cavoatrial junction. This was documented with  a spot image. The portacatheter was then tested and found to flush and aspirate well. The port was flushed with saline followed by 100 units/mL heparinized saline. The pocket was then closed in two layers using  first subdermal inverted interrupted absorbable sutures followed by a running subcuticular suture. The epidermis was then sealed with Dermabond. The dermatotomy at the venous access site was also closed with Dermabond. IMPRESSION: Successful placement of a right IJ approach Power Port with ultrasound and fluoroscopic guidance. The catheter is ready for use. Electronically Signed   By: Jacqulynn Cadet M.D.   On: 07/28/2020 14:55     All questions were answered. The patient knows to call the clinic with any problems, questions or concerns. I spent 30 minutes in the care of this patient, including history and physical exam, review of medical records, lab review and counseling about pain management, nourishment and coordination of care.    Benay Pike, MD 08/10/2020 12:05 PM

## 2020-08-09 NOTE — Assessment & Plan Note (Addendum)
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 She tolerated first cycle of chemotherapy well.  ROS today fatigue, pain around the G tube, referred pain to the ear. PE today unremarkable, no major concerns.  Given improvement in creatinine, we will bump up the dose of cisplatin to 100 %. I have discussed this with our pharmacy team who will help edit the orders.

## 2020-08-09 NOTE — Telephone Encounter (Signed)
Called patient to discuss after-hours call and ED visit. Stated she has follow-up for her feeding tube scheduled this afternoon and a visit with Dr Chryl Heck tomorrow. No further questions or needs at this time. Encouraged her to call us with any complications.

## 2020-08-10 ENCOUNTER — Other Ambulatory Visit: Payer: Medicare Other

## 2020-08-10 ENCOUNTER — Inpatient Hospital Stay: Payer: Medicare Other | Admitting: Nutrition

## 2020-08-10 ENCOUNTER — Inpatient Hospital Stay (HOSPITAL_BASED_OUTPATIENT_CLINIC_OR_DEPARTMENT_OTHER): Payer: Medicare Other | Admitting: Hematology and Oncology

## 2020-08-10 ENCOUNTER — Inpatient Hospital Stay: Payer: Medicare Other

## 2020-08-10 ENCOUNTER — Ambulatory Visit
Admission: RE | Admit: 2020-08-10 | Discharge: 2020-08-10 | Disposition: A | Discharge status: Home / Self Care | Payer: Medicare Other | Source: Ambulatory Visit | Attending: Radiation Oncology | Admitting: Radiation Oncology

## 2020-08-10 DIAGNOSIS — C109 Malignant neoplasm of oropharynx, unspecified: Secondary | ICD-10-CM

## 2020-08-10 DIAGNOSIS — Z931 Gastrostomy status: Secondary | ICD-10-CM | POA: Diagnosis not present

## 2020-08-10 DIAGNOSIS — C090 Malignant neoplasm of tonsillar fossa: Secondary | ICD-10-CM | POA: Diagnosis not present

## 2020-08-10 DIAGNOSIS — G893 Neoplasm related pain (acute) (chronic): Secondary | ICD-10-CM | POA: Diagnosis not present

## 2020-08-10 DIAGNOSIS — Z5111 Encounter for antineoplastic chemotherapy: Secondary | ICD-10-CM | POA: Diagnosis not present

## 2020-08-10 LAB — BASIC METABOLIC PANEL - CANCER CENTER ONLY
Anion gap: 10 (ref 5–15)
BUN: 15 mg/dL (ref 8–23)
CO2: 26 mmol/L (ref 22–32)
Calcium: 9.2 mg/dL (ref 8.9–10.3)
Chloride: 101 mmol/L (ref 98–111)
Creatinine: 0.96 mg/dL (ref 0.44–1.00)
GFR, Estimated: >60 mL/min (ref >60)
Glucose, Bld: 107 mg/dL — ABNORMAL HIGH (ref 70–99)
Potassium: 4.3 mmol/L (ref 3.5–5.1)
Sodium: 137 mmol/L (ref 135–145)

## 2020-08-10 LAB — CBC WITH DIFFERENTIAL (CANCER CENTER ONLY)
Abs Immature Granulocytes: 0.06 10*3/uL (ref 0.00–0.07)
Basophils Absolute: 0 10*3/uL (ref 0.0–0.1)
Basophils Relative: 0 %
Eosinophils Absolute: 0.1 10*3/uL (ref 0.0–0.5)
Eosinophils Relative: 1 %
HCT: 40.8 % (ref 36.0–46.0)
Hemoglobin: 13.8 g/dL (ref 12.0–15.0)
Immature Granulocytes: 1 %
Lymphocytes Relative: 11 %
Lymphs Abs: 1.5 10*3/uL (ref 0.7–4.0)
MCH: 31.7 pg (ref 26.0–34.0)
MCHC: 33.8 g/dL (ref 30.0–36.0)
MCV: 93.8 fL (ref 80.0–100.0)
Monocytes Absolute: 0.7 10*3/uL (ref 0.1–1.0)
Monocytes Relative: 6 %
Neutro Abs: 10.9 10*3/uL — ABNORMAL HIGH (ref 1.7–7.7)
Neutrophils Relative %: 81 %
Platelet Count: 329 10*3/uL (ref 150–400)
RBC: 4.35 MIL/uL (ref 3.87–5.11)
RDW: 13.2 % (ref 11.5–15.5)
WBC Count: 13.3 10*3/uL — ABNORMAL HIGH (ref 4.0–10.5)
nRBC: 0 % (ref 0.0–0.2)

## 2020-08-10 LAB — MAGNESIUM: Magnesium: 2.1 mg/dL (ref 1.7–2.4)

## 2020-08-10 MED ORDER — SODIUM CHLORIDE 0.9 % IV SOLN: Freq: Once | INTRAVENOUS | Status: DC

## 2020-08-10 MED ORDER — SODIUM CHLORIDE 0.9 % IV SOLN
30.0000 mg/m2 | Freq: Once | INTRAVENOUS | Status: DC
  Filled 2020-08-10: qty 56

## 2020-08-10 MED ORDER — HEPARIN SOD (PORK) LOCK FLUSH 100 UNIT/ML IV SOLN
500.0000 [IU] | Freq: Once | INTRAVENOUS | Status: DC | PRN
  Filled 2020-08-10: qty 5

## 2020-08-10 MED ORDER — SODIUM CHLORIDE 0.9 % IV SOLN
Freq: Once | INTRAVENOUS | Status: AC
  Filled 2020-08-10: qty 250

## 2020-08-10 MED ORDER — MAGNESIUM SULFATE 2 GM/50ML IV SOLN
2.0000 g | Freq: Once | INTRAVENOUS | Status: AC
Start: 2020-08-10 — End: 2020-08-10
  Administered 2020-08-10: 2 g via INTRAVENOUS

## 2020-08-10 MED ORDER — PALONOSETRON HCL INJECTION 0.25 MG/5ML
0.2500 mg | Freq: Once | INTRAVENOUS | Status: AC
  Administered 2020-08-10: 0.25 mg via INTRAVENOUS

## 2020-08-10 MED ORDER — MAGNESIUM SULFATE 2 GM/50ML IV SOLN
INTRAVENOUS | Status: AC
  Filled 2020-08-10: qty 50

## 2020-08-10 MED ORDER — SODIUM CHLORIDE 0.9% FLUSH
10.0000 mL | INTRAVENOUS | Status: DC | PRN
  Administered 2020-08-10: 10 mL
  Filled 2020-08-10: qty 10

## 2020-08-10 MED ORDER — SODIUM CHLORIDE 0.9 % IV SOLN
150.0000 mg | Freq: Once | INTRAVENOUS | Status: AC
  Administered 2020-08-10: 150 mg via INTRAVENOUS
  Filled 2020-08-10: qty 150

## 2020-08-10 MED ORDER — SODIUM CHLORIDE 0.9% FLUSH
10.0000 mL | INTRAVENOUS | Status: DC | PRN
  Filled 2020-08-10: qty 10

## 2020-08-10 MED ORDER — POTASSIUM CHLORIDE IN NACL 20-0.9 MEQ/L-% IV SOLN
INTRAVENOUS | Status: DC
  Filled 2020-08-10 (×2): qty 1000

## 2020-08-10 MED ORDER — SONAFINE EX EMUL
1.0000 "application " | Freq: Two times a day (BID) | CUTANEOUS | Status: DC
  Administered 2020-08-10: 1 via TOPICAL

## 2020-08-10 MED ORDER — SODIUM CHLORIDE 0.9 % IV SOLN
40.0000 mg/m2 | Freq: Once | INTRAVENOUS | Status: AC
  Administered 2020-08-10: 74 mg via INTRAVENOUS
  Filled 2020-08-10: qty 74

## 2020-08-10 MED ORDER — SODIUM CHLORIDE 0.9 % IV SOLN
10.0000 mg | Freq: Once | INTRAVENOUS | Status: AC
  Administered 2020-08-10: 10 mg via INTRAVENOUS
  Filled 2020-08-10: qty 10

## 2020-08-10 MED ORDER — POTASSIUM CHLORIDE IN NACL 20-0.9 MEQ/L-% IV SOLN
INTRAVENOUS | Status: DC
  Filled 2020-08-10: qty 1000

## 2020-08-10 MED ORDER — PALONOSETRON HCL INJECTION 0.25 MG/5ML
INTRAVENOUS | Status: AC
  Filled 2020-08-10: qty 5

## 2020-08-10 NOTE — Progress Notes (Signed)
Increase cisplatin to 40mg /m2 per Dr Chryl Heck

## 2020-08-10 NOTE — Assessment & Plan Note (Signed)
Patient went to ED for pain around G tube. Had some imaging which showed thinning of stomach around the G tube Evaluated by IR, no major concerns. No concerning findings on exam today, mild soreness around the tube, otherwise no acute abdomen.

## 2020-08-10 NOTE — Progress Notes (Signed)
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  She is receiving concurrent chemoradiation therapy with cisplatin.  Patient's status post PEG/port placement on June 1.  Weight is stable and was documented as 159.1 pounds.  She continues to complain of continued pain at the PEG site.  Patient has had feeding tube evaluated and is monitoring symptoms.  She is trying to eat more often.  States she can eat whatever she wants.  She denies nausea and vomiting.  Reports she varies between diarrhea versus constipation and is trying to find a balance.  She has not yet found a nutrition supplement she enjoys.  Estimated nutrition needs: 2070-2215 cal, 110-125 g protein, 2.2 L fluid.  Nutrition diagnosis: Predicted suboptimal energy intake continues.  Intervention: Continue to flush feeding tube on a daily basis. Try to eat more often throughout the day. Begin exploring oral nutrition supplements.  Provided oral nutrition supplement options for patient to try. Encouraged her to contact me with questions.  Monitoring, evaluation, goals: Patient will tolerate adequate calories and protein to minimize weight loss.  Next visit: Tuesday, June 21 during infusion.  **Disclaimer: This note was dictated with voice recognition software. Similar sounding words can inadvertently be transcribed and this note may contain transcription errors which may not have been corrected upon publication of note.**

## 2020-08-10 NOTE — Patient Instructions (Signed)
Implanted Port Home Guide An implanted port is a device that is placed under the skin. It is usually placed in the chest. The device can be used to give IV medicine, to take blood, or for dialysis. You may have an implanted port if: You need IV medicine that would be irritating to the small veins in your hands or arms. You need IV medicines, such as antibiotics, for a long period of time. You need IV nutrition for a long period of time. You need dialysis. When you have a port, your health care provider can choose to use the port instead of veins in your arms for these procedures. You may have fewer limitations when using a port than you would if you used other types of long-term IVs, and you will likely be able to return to normal activities afteryour incision heals. An implanted port has two main parts: Reservoir. The reservoir is the part where a needle is inserted to give medicines or draw blood. The reservoir is round. After it is placed, it appears as a small, raised area under your skin. Catheter. The catheter is a thin, flexible tube that connects the reservoir to a vein. Medicine that is inserted into the reservoir goes into the catheter and then into the vein. How is my port accessed? To access your port: A numbing cream may be placed on the skin over the port site. Your health care provider will put on a mask and sterile gloves. The skin over your port will be cleaned carefully with a germ-killing soap and allowed to dry. Your health care provider will gently pinch the port and insert a needle into it. Your health care provider will check for a blood return to make sure the port is in the vein and is not clogged. If your port needs to remain accessed to get medicine continuously (constant infusion), your health care provider will place a clear bandage (dressing) over the needle site. The dressing and needle will need to be changed every week, or as told by your health care provider. What  is flushing? Flushing helps keep the port from getting clogged. Follow instructions from your health care provider about how and when to flush the port. Ports are usually flushed with saline solution or a medicine called heparin. The need for flushing will depend on how the port is used: If the port is only used from time to time to give medicines or draw blood, the port may need to be flushed: Before and after medicines have been given. Before and after blood has been drawn. As part of routine maintenance. Flushing may be recommended every 4-6 weeks. If a constant infusion is running, the port may not need to be flushed. Throw away any syringes in a disposal container that is meant for sharp items (sharps container). You can buy a sharps container from a pharmacy, or you can make one by using an empty hard plastic bottle with a cover. How long will my port stay implanted? The port can stay in for as long as your health care provider thinks it is needed. When it is time for the port to come out, a surgery will be done to remove it. The surgery will be similar to the procedure that was done to putthe port in. Follow these instructions at home:  Flush your port as told by your health care provider. If you need an infusion over several days, follow instructions from your health care provider about how to take   care of your port site. Make sure you: Wash your hands with soap and water before you change your dressing. If soap and water are not available, use alcohol-based hand sanitizer. Change your dressing as told by your health care provider. Place any used dressings or infusion bags into a plastic bag. Throw that bag in the trash. Keep the dressing that covers the needle clean and dry. Do not get it wet. Do not use scissors or sharp objects near the tube. Keep the tube clamped, unless it is being used. Check your port site every day for signs of infection. Check for: Redness, swelling, or  pain. Fluid or blood. Pus or a bad smell. Protect the skin around the port site. Avoid wearing bra straps that rub or irritate the site. Protect the skin around your port from seat belts. Place a soft pad over your chest if needed. Bathe or shower as told by your health care provider. The site may get wet as long as you are not actively receiving an infusion. Return to your normal activities as told by your health care provider. Ask your health care provider what activities are safe for you. Carry a medical alert card or wear a medical alert bracelet at all times. This will let health care providers know that you have an implanted port in case of an emergency. Get help right away if: You have redness, swelling, or pain at the port site. You have fluid or blood coming from your port site. You have pus or a bad smell coming from the port site. You have a fever. Summary Implanted ports are usually placed in the chest for long-term IV access. Follow instructions from your health care provider about flushing the port and changing bandages (dressings). Take care of the area around your port by avoiding clothing that puts pressure on the area, and by watching for signs of infection. Protect the skin around your port from seat belts. Place a soft pad over your chest if needed. Get help right away if you have a fever or you have redness, swelling, pain, drainage, or a bad smell at the port site. This information is not intended to replace advice given to you by your health care provider. Make sure you discuss any questions you have with your healthcare provider. Document Revised: 06/30/2019 Document Reviewed: 06/30/2019 Elsevier Patient Education  2022 [employer] Inc.  

## 2020-08-10 NOTE — Assessment & Plan Note (Signed)
On oxycodone 10 mg PO, takes once or twice a day. No worsening of pain reported today.

## 2020-08-10 NOTE — Patient Instructions (Addendum)
[facility]  Discharge Instructions: Thank you for choosing [facility] to provide your oncology and hematology care.   If you have a lab appointment with the Cancer Center, please go directly to the Cancer Center and check in at the registration area.   Wear comfortable clothing and clothing appropriate for easy access to any Portacath or PICC line.   We strive to give you quality time with your provider. You may need to reschedule your appointment if you arrive late (15 or more minutes).  Arriving late affects you and other patients whose appointments are after yours.  Also, if you miss three or more appointments without notifying the office, you may be dismissed from the clinic at the provider's discretion.      For prescription refill requests, have your pharmacy contact our office and allow 72 hours for refills to be completed.    Today you received the following chemotherapy and/or immunotherapy agents : Cisplatin    To help prevent nausea and vomiting after your treatment, we encourage you to take your nausea medication as directed.  BELOW ARE SYMPTOMS THAT SHOULD BE REPORTED IMMEDIATELY: *FEVER GREATER THAN 100.4 F (38 C) OR HIGHER *CHILLS OR SWEATING *NAUSEA AND VOMITING THAT IS NOT CONTROLLED WITH YOUR NAUSEA MEDICATION *UNUSUAL SHORTNESS OF BREATH *UNUSUAL BRUISING OR BLEEDING *URINARY PROBLEMS (pain or burning when urinating, or frequent urination) *BOWEL PROBLEMS (unusual diarrhea, constipation, pain near the anus) TENDERNESS IN MOUTH AND THROAT WITH OR WITHOUT PRESENCE OF ULCERS (sore throat, sores in mouth, or a toothache) UNUSUAL RASH, SWELLING OR PAIN  UNUSUAL VAGINAL DISCHARGE OR ITCHING   Items with * indicate a potential emergency and should be followed up as soon as possible or go to the Emergency Department if any problems should occur.  Please show the CHEMOTHERAPY ALERT CARD or IMMUNOTHERAPY ALERT CARD at check-in to  the Emergency Department and triage nurse.  Should you have questions after your visit or need to cancel or reschedule your appointment, please contact [facility]  Dept: [phone]  and follow the prompts.  Office hours are 8:00 a.m. to 4:30 p.m. Monday - Friday. Please note that voicemails left after 4:00 p.m. may not be returned until the following business day.  We are closed weekends and major holidays. You have access to a nurse at all times for urgent questions. Please call the main number to the clinic Dept: [phone] and follow the prompts.   For any non-urgent questions, you may also contact your provider using MyChart. We now offer e-Visits for anyone 18 and older to request care online for non-urgent symptoms. For details visit [email or web address].   Also download the MyChart app! Go to the app store, search "MyChart", open the app, select [facility], and log in with your MyChart username and password.  Due to Covid, a mask is required upon entering the hospital/clinic. If you do not have a mask, one will be given to you upon arrival. For doctor visits, patients may have 1 support person aged 18 or older with them. For treatment visits, patients cannot have anyone with them due to current Covid guidelines and our immunocompromised population.   

## 2020-08-10 NOTE — Progress Notes (Signed)
Pt here for patient teaching.    Pt given Radiation and You booklet, Managing Acute Radiation Side Effects for Head and Neck Cancer handout, skin care instructions, and Sonafine.    Reviewed areas of pertinence such as fatigue, hair loss, mouth changes, skin changes, throat changes, earaches, and taste changes .   Pt able to give teach back of to pat skin, use unscented/gentle soap, and drink plenty of water,apply Sonafine bid and avoid applying anything to skin within 4 hours of treatment.   Pt demonstrated understanding and verbalizes understanding of information given and will contact nursing with any questions or concerns.    [email or web address]

## 2020-08-11 ENCOUNTER — Ambulatory Visit
Admission: RE | Admit: 2020-08-11 | Discharge: 2020-08-11 | Disposition: A | Discharge status: Home / Self Care | Payer: Medicare Other | Source: Ambulatory Visit | Attending: Radiation Oncology | Admitting: Radiation Oncology

## 2020-08-11 ENCOUNTER — Other Ambulatory Visit: Payer: Self-pay

## 2020-08-11 DIAGNOSIS — C090 Malignant neoplasm of tonsillar fossa: Secondary | ICD-10-CM | POA: Diagnosis not present

## 2020-08-12 ENCOUNTER — Ambulatory Visit
Admission: RE | Admit: 2020-08-12 | Discharge: 2020-08-12 | Disposition: A | Discharge status: Home / Self Care | Payer: Medicare Other | Source: Ambulatory Visit | Attending: Radiation Oncology | Admitting: Radiation Oncology

## 2020-08-12 ENCOUNTER — Encounter: Payer: Self-pay | Admitting: Physical Therapy

## 2020-08-12 ENCOUNTER — Ambulatory Visit: Payer: Medicare Other | Admitting: Physical Therapy

## 2020-08-12 DIAGNOSIS — C109 Malignant neoplasm of oropharynx, unspecified: Secondary | ICD-10-CM

## 2020-08-12 DIAGNOSIS — C090 Malignant neoplasm of tonsillar fossa: Secondary | ICD-10-CM | POA: Diagnosis not present

## 2020-08-12 DIAGNOSIS — R1310 Dysphagia, unspecified: Secondary | ICD-10-CM | POA: Diagnosis not present

## 2020-08-12 DIAGNOSIS — R293 Abnormal posture: Secondary | ICD-10-CM

## 2020-08-12 NOTE — Therapy (Signed)
[place], [place], [address] Phone: [phone]   Fax:  [phone]  Physical Therapy Evaluation  Patient Details  Name: Cathy Knight MRN: [id number] Date of Birth: 08-16-1950 Referring Provider (PT): Reita May Date: 08/12/2020   PT End of Session - 08/12/20 1017     Visit Number 1    Number of Visits 2    Date for PT Re-Evaluation 10/07/20    PT Start Time 0947    PT Stop Time 1017    PT Time Calculation (min) 30 min    Activity Tolerance Patient tolerated treatment well    Behavior During Therapy [facility] for tasks assessed/performed             Past Medical History:  Diagnosis Date   Cancer ([place])    Hypertension     Past Surgical History:  Procedure Laterality Date   IR CM INJ ANY COLONIC TUBE W/FLUORO  08/09/2020   IR GASTROSTOMY TUBE MOD SED  07/28/2020   IR IMAGING GUIDED PORT INSERTION  07/28/2020    There were no vitals filed for this visit.    Subjective Assessment - 08/12/20 0948     Subjective I am starting to feel the radiation in my thoat. It is getting sore. I am still having the pain in my ear.    Pertinent History Squamous cell carcinoma of oropharynx Stage IA, presented with a 5 month history of right sided otalgia and associated throat pain and treated with abx and steroids without improvement, 05/28/20 CT neck revealed a right tonsillar mass and nodular mucosal thickening of the soft palate with prominent right level 2 lymph nodes, 06/11/20 Biopsy by Dr. Charolett Bumpers of the right throat revealed fragments of keratinizing well differentiated squamous cell carcinoma. There was also a single focus of submucosa with invasive carcinoma present. The p16 immunostain was positive. In-situ study for HPV 16/18 was negative. Broad HR HPV ISH study was negative, 06/18/20 PET revealed intense update in the right palatine tonsil with bilateral level 2A and right level 4 nodes, possibly  reflecting spread of disease, will receive 35 fractions of radiation to her right tonsil and bilateral neck and weekly cisplatin. She started on 08/04/20 and will complete 09/22/20    Patient Stated Goals to gain info from provider    Currently in Pain? Yes    Pain Score 5     Pain Location Throat    Pain Orientation Right    Pain Descriptors / Indicators Sore    Pain Type Acute pain    Pain Onset More than a month ago    Pain Frequency Intermittent    Aggravating Factors  swallowing, talking    Pain Relieving Factors oxycodone    Effect of Pain on Daily Activities no effect                [facility] PT Assessment - 08/12/20 0001       Assessment   Medical Diagnosis R tonsillar cancer    Referring Provider (PT) Isidore Moos    Onset Date/Surgical Date 05/28/20    Hand Dominance Right    Prior Therapy none      Precautions   Precautions Other (comment)    Precaution Comments active cancer      Restrictions   Weight Bearing Restrictions No      Balance Screen   Has the patient fallen in the past 6 months No    Has the patient had a decrease  in activity level because of a fear of falling?  No    Is the patient reluctant to leave their home because of a fear of falling?  No      Home Social worker Private residence    Living Arrangements Non-relatives/Friends    Available Help at Discharge Friend(s)    Type of Home House      Prior Function   Level of [place] Retired    Leisure pt does not intentionally exercise but moves all days      Cognition   Overall Cognitive Status Within Functional Limits for tasks assessed      Functional Tests   Functional tests Sit to Stand      Sit to Stand   Comments 30 sec sit to stand: 11 reps avg is 14   pt reports she has bad hips and bad knees     Posture/Postural Control   Posture/Postural Control Postural limitations    Postural Limitations Rounded Shoulders;Forward head      ROM /  Strength   AROM / PROM / Strength AROM      AROM   Overall AROM Comments Bilateral shoulder ROM WFL - R has 3 tears, 1 effects the nerve that innervates the bicep, ROM is WFL pt reports it stays this way unless she overuses it    Cervical Flexion Wfl    Cervical Extension WFL    Cervical - Right Side Bend WFL    Cervical - Left Side Bend WFL    Cervical - Right Rotation WFL    Cervical - Left Rotation [facility]      Ambulation/Gait   Ambulation/Gait Yes    Ambulation/Gait Assistance 7: Independent    Ambulation Distance (Feet) 10 Feet    Gait Pattern Within Functional Limits    Ambulation Surface Level    Gait Comments pt reports she can not walk long distances due to pain in hips               LYMPHEDEMA/ONCOLOGY QUESTIONNAIRE - 08/12/20 0001       Lymphedema Assessments   Lymphedema Assessments Head and Neck      Head and Neck   4 cm superior to sternal notch around neck 36 cm    6 cm superior to sternal notch around neck 36.5 cm    8 cm superior to sternal notch around neck 37.5 cm                     Objective measurements completed on examination: See above findings.               PT Education - 08/12/20 1110     Education Details Neck ROM, importance of posture when sitting, standing and lying down, deep breathing, walking program and importance of staying active throughout treatment, CURE article on staying active, "Why exercise?" flyer, lymphedema and PT info    Person(s) Educated Patient    Methods Explanation;Handout    Comprehension Verbalized understanding                 PT Long Term Goals - 08/12/20 1111       PT LONG TERM GOAL #1   Title Pt will return to baseline cervical ROM measurements and not demonstrate any signs of lymphedema.    Time 8    Period Weeks    Status New    Target Date 10/07/20  Head and Neck Clinic Goals - 08/12/20 1110       Patient will be able to verbalize understanding  of a home exercise program for cervical range of motion, posture, and walking.          Time 1    Period Days    Status Achieved      Patient will be able to verbalize understanding of proper sitting and standing posture.          Time 1    Period Days    Status Achieved      Patient will be able to verbalize understanding of lymphedema risk and availability of treatment for this condition.          Time 1    Period Days    Status Achieved                Plan - 08/12/20 1018     Clinical Impression Statement Pt reports the PT with recent diagnosis of right tonsillar cancer. She is currently undergoing radiation and chemo. Her skin is becoming red at anterior neck. She reports she has bad hips that are not quite to the point of needing replacement and bad knees. She has 3 tears in her R rotator cuff (pt is R handed) and surgery was recommended but pt declined. She reports her ROM is [facility] as long as she does not over use her R shoulder. L shoulder ROM is WFL and so is her cervical ROM. Pt is very active in her day though she reports she does not formally exercise.Educated pt about signs and symptoms of lymphedema as well as anatomy and physiology of lymphatic system. Educated pt in importance of staying as active as possible throughout treatment to decrease fatigue as well as head and neck ROM exercises to decrease loss of ROM. Will see pt after completion of radiation to reassess ROM and assess for lymphedema to determine therapy needs at that time.    Personal Factors and Comorbidities Comorbidity 1    Comorbidities COPD    Stability/Clinical Decision Making Stable/Uncomplicated    Clinical Decision Making Low    Rehab Potential Good    PT Frequency --   eval and 1 f/u visit   PT Duration 8 weeks    PT Treatment/Interventions ADLs/Self Care Home Management;Patient/family education;Therapeutic exercise    PT Next Visit Plan reassess baselines    PT Home Exercise Plan head and neck  ROM exercises    Consulted and Agree with Plan of Care Patient             Patient will benefit from skilled therapeutic intervention in order to improve the following deficits and impairments:  Postural dysfunction, Pain, Decreased knowledge of precautions  Visit Diagnosis: Abnormal posture  Oropharyngeal carcinoma ([place])     Problem List Patient Active Problem List   Diagnosis Date Noted   G tube feedings ([place]) 08/10/2020   Squamous cell carcinoma of oropharynx ([place]) 07/13/2020   Cancer related pain 07/13/2020   Cancer of tonsillar fossa ([place]) 07/13/2020   Oropharynx cancer ([place]) 06/29/2020   COPD (chronic obstructive pulmonary disease) ([place]) 04/24/2017   Status epilepticus ([place]) 04/03/2017   Mammogram declined 04/24/2016   Essential hypertension 07/20/2015   Atherosclerosis of native artery of extremity ([place]) 09/29/2011   Mixed hyperlipidemia 09/29/2011   Tobacco use disorder 09/29/2011    Allyson Sabal [facility] 08/12/2020, 11:13 AM  [place], [place], [address] Phone:  [phone]   Fax:  [phone]  Name: Cathy Knight MRN: [id number] Date of Birth: 1950-05-11  Manus Gunning, PT 08/12/20 11:13 AM

## 2020-08-12 NOTE — Progress Notes (Signed)
Oncology Nurse Navigator Documentation   I met with Cathy Knight before her scheduled appointment with Allyson Sabal PT today during Head and Neck MDC. She has seen Garald Balding SLP at his outpatient office for SLP evaluation. She reports some increased drainage from her PEG site today and asked if I could tighten it. The drainage is brown/yellow and no infection is present. I tightened the PEG retention ring to 4cm which is where it was when it was previously assessed by me. Ms. Beckles knows to contact me if she has any further questions or concerns.   Harlow Asa RN, BSN, OCN Head & Neck Oncology Nurse [place] [facility] Phone # [phone]  Fax # [phone]

## 2020-08-13 ENCOUNTER — Other Ambulatory Visit: Payer: Self-pay

## 2020-08-13 ENCOUNTER — Ambulatory Visit
Admission: RE | Admit: 2020-08-13 | Discharge: 2020-08-13 | Disposition: A | Discharge status: Home / Self Care | Payer: Medicare Other | Source: Ambulatory Visit | Attending: Radiation Oncology | Admitting: Radiation Oncology

## 2020-08-13 DIAGNOSIS — C090 Malignant neoplasm of tonsillar fossa: Secondary | ICD-10-CM | POA: Diagnosis not present

## 2020-08-16 ENCOUNTER — Other Ambulatory Visit: Payer: Self-pay | Admitting: Radiation Oncology

## 2020-08-16 ENCOUNTER — Other Ambulatory Visit: Payer: Self-pay

## 2020-08-16 ENCOUNTER — Ambulatory Visit
Admission: RE | Admit: 2020-08-16 | Discharge: 2020-08-16 | Disposition: A | Discharge status: Home / Self Care | Payer: Medicare Other | Source: Ambulatory Visit | Attending: Radiation Oncology | Admitting: Radiation Oncology

## 2020-08-16 ENCOUNTER — Other Ambulatory Visit: Payer: Medicare Other

## 2020-08-16 ENCOUNTER — Ambulatory Visit: Payer: Medicare Other | Admitting: Hematology and Oncology

## 2020-08-16 DIAGNOSIS — C090 Malignant neoplasm of tonsillar fossa: Secondary | ICD-10-CM | POA: Diagnosis not present

## 2020-08-16 MED ORDER — LIDOCAINE VISCOUS HCL 2 % MT SOLN: OROMUCOSAL | 3 refills | Status: DC

## 2020-08-16 NOTE — Progress Notes (Signed)
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 She is here for cycle 3 of planned chemotherapy.  Review of systems from last week are pertinent for fatigue and some sore throat. Physical examination, mild erythema noted in the oral mucosa, no ulceration.  G-tube and port site appears well.  No concern for infection. CBC reviewed and okay to proceed.  Rest of the metabolic panel is pending at this time.  Okay to proceed with week 3 as planned if labs are within parameters.  Mucositis due to antineoplastic therapy Mucositis related to concurrent chemotherapy and radiation.  Mild at this time, can continue using viscous lidocaine.  Will reevaluate it weekly.  Cancer related pain She is taking oxycodone 1 to 2 tablets a day for cancer related pain.  No increase in pain medicine needs at this time.  We will continue to monitor.  No orders of the defined types were placed in this encounter.   HISTORY OF PRESENTING ILLNESS:   Cathy Knight 70 y.o. female is here because of SCC oropharynx, P 16 +  Chronology  This is a very pleasant 69 year old female patient with newly diagnosed oropharyngeal cancer referred to medical oncology for recommendations.  During her last visit we discussed about concurrent chemoradiation given bilateral disease.  Interval History Patient is here for follow-up prior to week 3 of cisplatin. She is doing well overall.  She was moving last week and feels very tired.  In the past 2 days she has also noticed some sore throat.  She has been trying to take the oral pain  medicine which was not really helpful.  Dr. Isidore Moos has just given her some viscous lidocaine which she is yet to pick up.  She still able to swallow food.  She is still able to maintain weight.  No nausea vomiting or diarrhea.  Occasional tingling in her hands which does not last, no interference with activities of daily living. No hearing loss reported. Rest of the pertinent 10 point ROS reviewed and negative.  MEDICAL HISTORY:  Past Medical History:  Diagnosis Date   Cancer ([place])    Hypertension     SURGICAL HISTORY: Past Surgical History:  Procedure Laterality Date   IR CM INJ ANY COLONIC TUBE W/FLUORO  08/09/2020   IR GASTROSTOMY TUBE MOD SED  07/28/2020   IR IMAGING GUIDED PORT INSERTION  07/28/2020    SOCIAL HISTORY: Social History   Socioeconomic History   Marital status: Single    Spouse name: Not on file   Number of children: Not on file   Years of education: Not on file   Highest education level: Not on file  Occupational History   Not on file  Tobacco Use   Smoking status: Every Day    Packs/day: 1.00    Years: 15.00    Pack years: 15.00    Types: Cigarettes   Smokeless tobacco: Never  Substance and Sexual Activity   Alcohol use: Not on file   Drug use: Not on file   Sexual activity: Not on file  Other Topics Concern   Not on file  Social History Narrative  Not on file   Social Determinants of Health   Financial Resource Strain: Low Risk    Difficulty of Paying Living Expenses: Not very hard  Food Insecurity: No Food Insecurity   Worried About Running Out of Food in the Last Year: Never true   Ran Out of Food in the Last Year: Never true  Transportation Needs: No Transportation Needs   Lack of Transportation (Medical): No   Lack of Transportation (Non-Medical): No  Physical Activity: Not on file  Stress: Not on file  Social Connections: Not on file  Intimate Partner Violence: Not on file    FAMILY HISTORY: No family history on file.  ALLERGIES:   is allergic to atorvastatin.  MEDICATIONS:  Current Outpatient Medications  Medication Sig Dispense Refill   albuterol (VENTOLIN HFA) 108 (90 Base) MCG/ACT inhaler Inhale 1-2 puffs into the lungs every 6 (six) hours as needed for wheezing or shortness of breath.     amLODipine (NORVASC) 5 MG tablet Take 5 mg by mouth daily.     aspirin 81 MG chewable tablet Chew 81 mg by mouth daily.     buPROPion (WELLBUTRIN SR) 150 MG 12 hr tablet Start one week before quit date. Take 1 tab daily x 3 days, then 1 tab BID thereafter. (Patient taking differently: Take 150 mg by mouth daily.) 60 tablet 2   calcium citrate-vitamin D (CITRACAL+D) 315-200 MG-UNIT tablet Take 1 tablet by mouth 2 (two) times daily.     carvedilol (COREG) 6.25 MG tablet Take 6.25 mg by mouth 2 (two) times daily with a meal.     Cholecalciferol (VITAMIN D3) 125 MCG (5000 UT) CAPS Take 5,000 Units by mouth daily.     co-enzyme Q-10 30 MG capsule Take 30 mg by mouth daily.     cyclobenzaprine (FLEXERIL) 10 MG tablet Take 10 mg by mouth 3 (three) times daily as needed for muscle spasms.     dexamethasone (DECADRON) 4 MG tablet Take 2 tablets (8 mg total) by mouth daily. Take daily x 3 days starting the day after cisplatin chemotherapy. Take with food. 30 tablet 1   diphenoxylate-atropine (LOMOTIL) 2.5-0.025 MG tablet Take 1 tablet by mouth 4 (four) times daily as needed for diarrhea or loose stools. 30 tablet 0   Evolocumab (REPATHA SURECLICK) 220 MG/ML SOAJ Inject 140 mg into the skin every 14 (fourteen) days.     Flaxseed, Linseed, (FLAXSEED OIL) 1000 MG CAPS Take 2,000 mg by mouth daily.     lidocaine (XYLOCAINE) 2 % solution Patient: Mix 1part 2% viscous lidocaine, 1part H20. Swish & swallow 93mL of diluted mixture, 35min before meals and at bedtime, up to QID 200 mL 3   lidocaine-prilocaine (EMLA) cream Apply to affected area once 30 g 3   lisinopril (ZESTRIL) 20 MG tablet Take 1 tablet by mouth daily.     Multiple Vitamins-Minerals  (MULTIVITAMIN WOMEN 50+ PO) Take 1 tablet by mouth daily.     nicotine (NICODERM CQ - DOSED IN MG/24 HOURS) 21 mg/24hr patch Place 21 mg onto the skin daily.     Nutritional Supplements (FEEDING SUPPLEMENT, OSMOLITE 1.5 CAL,) LIQD Osmolite 1.5 x 6 cartons/day - Give 1 1/2 cartons via PEG QID. Flush tube with 60 ml water before and after each feeding. Drink by mouth or give via tube additional 720 ml (3c.) water daily. 1422 mL 0   omeprazole (PRILOSEC) 20 MG capsule Take 20 mg by mouth daily.     ondansetron (ZOFRAN) 8 MG tablet Take 1  tablet (8 mg total) by mouth 2 (two) times daily as needed. Start on the third day after cisplatin chemotherapy. 30 tablet 1   Oxycodone HCl 10 MG TABS Take 1 tablet (10 mg total) by mouth 2 (two) times daily as needed for up to 60 doses (cancer pain). 60 tablet 0   potassium chloride (KLOR-CON) 10 MEQ tablet Take 10 mEq by mouth daily.     predniSONE (DELTASONE) 20 MG tablet Take 20 mg by mouth daily as needed (COPD).     prochlorperazine (COMPAZINE) 10 MG tablet Take 1 tablet (10 mg total) by mouth every 6 (six) hours as needed (Nausea or vomiting). 30 tablet 1   sharps container Use as directed for sharps disposal     SPIRIVA RESPIMAT 2.5 MCG/ACT AERS Inhale 2 puffs into the lungs daily.     vitamin B-12 (CYANOCOBALAMIN) 500 MCG tablet Take 500 mcg by mouth daily.     No current facility-administered medications for this visit.    PHYSICAL EXAMINATION:  ECOG PERFORMANCE STATUS: 1 - Symptomatic but completely ambulatory  Vitals:   08/17/20 0920  BP: 124/71  Pulse: 84  Resp: 17  Temp: 97.9 F (36.6 C)  SpO2: 100%   Filed Weights   08/17/20 0920  Weight: 158 lb 9.6 oz (71.9 kg)   Physical Exam Constitutional:      Appearance: Normal appearance.  HENT:     Head: Normocephalic and atraumatic.  Cardiovascular:     Rate and Rhythm: Normal rate and regular rhythm.     Pulses: Normal pulses.     Heart sounds: Normal heart sounds.  Pulmonary:      Effort: Pulmonary effort is normal.     Breath sounds: Normal breath sounds.  Abdominal:     General: Abdomen is flat. Bowel sounds are normal.     Palpations: Abdomen is soft.  Musculoskeletal:     Cervical back: Normal range of motion and neck supple.  Lymphadenopathy:     Cervical: No cervical adenopathy.  Skin:    General: Skin is warm and dry.  Neurological:     General: No focal deficit present.     Mental Status: She is alert.  Psychiatric:        Mood and Affect: Mood normal.   LABORATORY DATA:    Lab Results  Component Value Date   WBC 12.9 (H) 08/17/2020   HGB 12.9 08/17/2020   HCT 38.0 08/17/2020   MCV 93.1 08/17/2020   PLT 285 08/17/2020     Chemistry      Component Value Date/Time   NA 137 08/10/2020 0937   K 4.3 08/10/2020 0937   CL 101 08/10/2020 0937   CO2 26 08/10/2020 0937   BUN 15 08/10/2020 0937   CREATININE 0.96 08/10/2020 0937      Component Value Date/Time   CALCIUM 9.2 08/10/2020 0937   ALKPHOS 55 07/13/2020 1239   AST 14 (L) 07/13/2020 1239   ALT 8 07/13/2020 1239   BILITOT 0.3 07/13/2020 1239       In situ study for HPV 16/18 is negative. Slides will ne sent to [facility] for a broader HR HPV panel  Addendum electronically signed by Isac Caddy Askin, MD on 06/18/2020 at  5:04 PM  Addendum    The p16 immunostain is positive. HR HPV study ordered.  Addendum electronically signed by Isac Caddy Askin, MD on 06/18/2020 at  1:57 PM  Diagnosis    A: Throat, right, biopsy - Fragments of keratinizing well  differentiated squamous carcinoma.  A single focus of submucosa with invasive carcinoma is present   Addendum 3    Noted 07/01/2000: Tissue was sent to [facility] for a broad HR HPV ISH study.  Results were: Negative.  The controls were appropriate      RADIOGRAPHIC STUDIES: I have personally reviewed the radiological images as listed and agreed with the findings in the report. CT Abdomen Pelvis Wo Contrast  Addendum Date:  08/07/2020   ADDENDUM REPORT: 08/07/2020 23:13 ADDENDUM: No fat stranding surrounding the gastric lumen. No organized fluid collection. No gastric wall thickening or pneumatosis. Gas along the tract is nonspecific. Pexied g-tube with T-bar as well as inflated intra-luminal balloon noted. If further concern, recommend CT with intravenous contrast and PO contrast for further evaluation. These results were called by telephone at the time of interpretation on 08/07/2020 at 11:11 pm to provider [facility] , who verbally acknowledged these results. Electronically Signed   By: Iven Finn M.D.   On: 08/07/2020 23:13   Result Date: 08/07/2020 CLINICAL DATA:  Abdominal distension progressive abdominal pain. Cancer tonsils/oropharynx. having feeding tube placed about 10 days ago. She endorses pain at the site x3-4 days, and then noticed bleeding in the tube today. EXAM: CT ABDOMEN AND PELVIS WITHOUT CONTRAST TECHNIQUE: Multidetector CT imaging of the abdomen and pelvis was performed following the standard protocol without IV contrast. COMPARISON:  None. FINDINGS: Lower chest: Bilateral lower lobe subsegmental atelectasis. Mitral annular calcifications. Coronary artery calcifications. No acute abnormality Hepatobiliary: No focal liver abnormality. No gallstones, gallbladder wall thickening, or pericholecystic fluid. No biliary dilatation. Pancreas: No focal lesion. Normal pancreatic contour. No surrounding inflammatory changes. No main pancreatic ductal dilatation. Spleen: Normal in size without focal abnormality. Adrenals/Urinary Tract: No adrenal nodule bilaterally. No nephrolithiasis, no hydronephrosis. There is a 2.4 x 2.1 cm lesion within the left kidney with a density of 38 Hounsfield units. No ureterolithiasis or hydroureter. The urinary bladder is distended with urine. Stomach/Bowel: Gastrostomy tube within the gastric lumen with however very thin wall around the balloon anteriorly. Question erosion of the  gastric wall. Markedly limited evaluation on this noncontrast study. Otherwise the small and large bowel demonstrates no bowel thickening or dilatation. Diffuse sigmoid diverticulosis. No pneumatosis. The appendix is unremarkable. Vascular/Lymphatic: Aortobifem bypass. No abdominal aorta or iliac aneurysm. Severe atherosclerotic plaque of the aorta and its branches. No abdominal, pelvic, or inguinal lymphadenopathy. Reproductive: Atrophic uterus.  Bilateral adnexa are unremarkable. Other: No intraperitoneal free fluid. No intraperitoneal free gas. No organized fluid collection. Musculoskeletal: Couple of tiny fat containing supraumbilical ventral hernias (6: 86-92). No suspicious lytic or blastic osseous lesions. No acute displaced fracture. Multilevel degenerative changes of the spine. Chronic anterior anterior wedge deformity of the L2 level. IMPRESSION: 1. Gastrostomy tube within the gastric lumen with however very thin wall around the balloon anteriorly. Question erosion of the gastric wall. Markedly limited evaluation on this noncontrast study (IV and PO contrast recommended). 2. Indeterminate 2.4 cm left renal lesion. Malignancy not excluded. Recommend MRI renal protocol for further evaluation. 3. Urinary bladder distended with urine. 4. Diffuse sigmoid diverticulosis with no acute diverticulitis. 5. Couple of tiny fat containing supraumbilical ventral hernias 6. Aortic Atherosclerosis (ICD10-I70.0) - severe status post aortobifem bypass with markedly limited evaluation on this noncontrast study. Electronically Signed: By: Iven Finn M.D. On: 08/07/2020 22:43   IR Gastrostomy Tube  Result Date: 07/28/2020 INDICATION: 70 year old female with oro pharyngeal cancer. She presents for percutaneous gastrostomy tube in preparation for possible radiation esophagitis  and dysphagia during treatment. EXAM: Fluoroscopically guided placement of percutaneous balloon retention gastrostomy tube Interventional  Radiologist:  Criselda Peaches, MD MEDICATIONS: None. ANESTHESIA/SEDATION: Versed 2 mg IV; Fentanyl 50 mcg IV Moderate Sedation Time:  22 minutes The patient was continuously monitored during the procedure by the interventional radiology nurse under my direct supervision. CONTRAST:  27mL OMNIPAQUE IOHEXOL 300 MG/ML  SOLN FLUOROSCOPY TIME:  Fluoroscopy Time: 4 minutes 0 seconds (22 mGy). COMPLICATIONS: None immediate. PROCEDURE: Informed written consent was obtained from the patient after a thorough discussion of the procedural risks, benefits and alternatives. All questions were addressed. Maximal Sterile Barrier Technique was utilized including caps, mask, sterile gowns, sterile gloves, sterile drape, hand hygiene and skin antiseptic. A timeout was performed prior to the initiation of the procedure. Maximal barrier sterile technique utilized including caps, mask, sterile gowns, sterile gloves, large sterile drape, hand hygiene, and chlorhexadine skin prep. An angled catheter was advanced over a wire under fluoroscopic guidance through the nose, down the esophagus and into the body of the stomach. The stomach was then insufflated with several 100 ml of air. Fluoroscopy confirmed location of the gastric bubble, as well as inferior displacement of the barium stained colon. Under direct fluoroscopic guidance, a single T-tack was placed, and the anterior gastric wall drawn up against the anterior abdominal wall. Percutaneous access was then obtained into the mid gastric body with an 18 gauge sheath needle. Aspiration of air, and injection of contrast material under fluoroscopy confirmed needle placement. An Amplatz wire was advanced in the gastric body and the access needle exchanged for a 9-French vascular sheath. A snare device was advanced through the vascular sheath and an Amplatz wire advanced through the angled catheter. There was a difficulty in snaring the wire from within the stomach. Given difficulty  maintaining insufflation of the stomach, the decision was made to proceed with placement of a balloon retention gastrostomy tube with the assistance of balloon expansion. Therefore, the 9 French sheath was removed and a 10 x 4 [employer] balloon was advanced over the wire and positioned across the tract from the abdominal wall surface into the stomach. A 20 French Entuit gastrostomy tube was also positioned on the balloon. The balloon was inflated and then the balloon and gastrostomy tube were pushed as a unit into the stomach. The retention balloon was filled with 18 mL saline and pulled back snug against the anterior abdominal wall. The external bumper was fixed in place. Contrast injection through the tube confirms tube placement within the stomach. The tube was then flushed with saline. Hand injection of contrast material confirmed intragastric location. The T-tack retention suture was then cut. The patient will be observed for several hours with the newly placed tube on low wall suction to evaluate for any post procedure complication. The patient tolerated the procedure well, there is no immediate complication. IMPRESSION: Successful placement of a 20 French balloon retention gastrostomy tube. Electronically Signed   By: Jacqulynn Cadet M.D.   On: 07/28/2020 14:59   IR Cm Inj Any Colonic Tube W/Fluoro  Result Date: 08/10/2020 INDICATION: 70 year old woman with history of oro pharyngeal malignancy status post 56 French G-tube placement on 07/28/2020 returns with continued pain at insertion site. EXAM: Fluoroscopic gastrostomy tube evaluation. MEDICATIONS: None ANESTHESIA/SEDATION: None CONTRAST:  20 mL Omnipaque 300-administered into the gastric lumen. FLUOROSCOPY TIME:  Fluoroscopy Time: 1 minutes 6 seconds (26 mGy). COMPLICATIONS: None immediate. PROCEDURE: Informed written consent was obtained from the patient after a thorough discussion of the  procedural risks, benefits and alternatives. Patient  positioned supine on the angiography table. Scout image demonstrated gastrostomy tube in appropriate position. Contrast administered through the tube under fluoroscopy confirmed appropriate positioning within the gastric lumen. The balloon was advanced easily into the gastric lumen confirming that it was not inflated within the wall of the stomach. The balloon was deflated while in the gastric fundus and reinflated with normal saline and retracted to the anterior abdominal wall. IMPRESSION: Fluoroscopic evaluation of gastrostomy tube demonstrates appropriate positioning of the catheter tip and balloon. Electronically Signed   By: Miachel Roux M.D.   On: 08/10/2020 08:55   IR IMAGING GUIDED PORT INSERTION  Result Date: 07/28/2020 INDICATION: 70 year old female with newly diagnosed oro pharyngeal cancer. She presents for port catheter placement to establish durable venous access prior to chemotherapy. EXAM: IMPLANTED PORT A CATH PLACEMENT WITH ULTRASOUND AND FLUOROSCOPIC GUIDANCE MEDICATIONS: None ANESTHESIA/SEDATION: Versed 3 mg IV; Fentanyl 100 mcg IV; Moderate Sedation Time:  18 minutes The patient was continuously monitored during the procedure by the interventional radiology nurse under my direct supervision. FLUOROSCOPY TIME:  0 minutes, 6 seconds (2 mGy) COMPLICATIONS: None immediate. PROCEDURE: The right neck and chest was prepped with chlorhexidine, and draped in the usual sterile fashion using maximum barrier technique (cap and mask, sterile gown, sterile gloves, large sterile sheet, hand hygiene and cutaneous antiseptic). Local anesthesia was attained by infiltration with 1% lidocaine with epinephrine. Ultrasound demonstrated patency of the right internal jugular vein, and this was documented with an image. Under real-time ultrasound guidance, this vein was accessed with a 21 gauge micropuncture needle and image documentation was performed. A small dermatotomy was made at the access site with an 11  scalpel. A 0.018" wire was advanced into the SVC and the access needle exchanged for a 36F micropuncture vascular sheath. The 0.018" wire was then removed and a 0.035" wire advanced into the IVC. An appropriate location for the subcutaneous reservoir was selected below the clavicle and an incision was made through the skin and underlying soft tissues. The subcutaneous tissues were then dissected using a combination of blunt and sharp surgical technique and a pocket was formed. A single lumen power injectable portacatheter was then tunneled through the subcutaneous tissues from the pocket to the dermatotomy and the port reservoir placed within the subcutaneous pocket. The venous access site was then serially dilated and a peel away vascular sheath placed over the wire. The wire was removed and the port catheter advanced into position under fluoroscopic guidance. The catheter tip is positioned in the superior cavoatrial junction. This was documented with a spot image. The portacatheter was then tested and found to flush and aspirate well. The port was flushed with saline followed by 100 units/mL heparinized saline. The pocket was then closed in two layers using first subdermal inverted interrupted absorbable sutures followed by a running subcuticular suture. The epidermis was then sealed with Dermabond. The dermatotomy at the venous access site was also closed with Dermabond. IMPRESSION: Successful placement of a right IJ approach Power Port with ultrasound and fluoroscopic guidance. The catheter is ready for use. Electronically Signed   By: Jacqulynn Cadet M.D.   On: 07/28/2020 14:55       Benay Pike, MD 08/17/2020 9:47 AM

## 2020-08-17 ENCOUNTER — Inpatient Hospital Stay: Payer: Medicare Other | Admitting: Nutrition

## 2020-08-17 ENCOUNTER — Inpatient Hospital Stay: Payer: Medicare Other

## 2020-08-17 ENCOUNTER — Ambulatory Visit
Admission: RE | Admit: 2020-08-17 | Discharge: 2020-08-17 | Disposition: A | Discharge status: Home / Self Care | Payer: Medicare Other | Source: Ambulatory Visit | Attending: Radiation Oncology | Admitting: Radiation Oncology

## 2020-08-17 ENCOUNTER — Inpatient Hospital Stay (HOSPITAL_BASED_OUTPATIENT_CLINIC_OR_DEPARTMENT_OTHER): Payer: Medicare Other | Admitting: Hematology and Oncology

## 2020-08-17 ENCOUNTER — Encounter: Payer: Self-pay | Admitting: Hematology and Oncology

## 2020-08-17 DIAGNOSIS — K1231 Oral mucositis (ulcerative) due to antineoplastic therapy: Secondary | ICD-10-CM | POA: Insufficient documentation

## 2020-08-17 DIAGNOSIS — Z5111 Encounter for antineoplastic chemotherapy: Secondary | ICD-10-CM | POA: Diagnosis not present

## 2020-08-17 DIAGNOSIS — G893 Neoplasm related pain (acute) (chronic): Secondary | ICD-10-CM | POA: Diagnosis not present

## 2020-08-17 DIAGNOSIS — C090 Malignant neoplasm of tonsillar fossa: Secondary | ICD-10-CM

## 2020-08-17 DIAGNOSIS — Z95828 Presence of other vascular implants and grafts: Secondary | ICD-10-CM

## 2020-08-17 DIAGNOSIS — C109 Malignant neoplasm of oropharynx, unspecified: Secondary | ICD-10-CM

## 2020-08-17 LAB — CBC WITH DIFFERENTIAL (CANCER CENTER ONLY)
Abs Immature Granulocytes: 0.08 10*3/uL — ABNORMAL HIGH (ref 0.00–0.07)
Basophils Absolute: 0 10*3/uL (ref 0.0–0.1)
Basophils Relative: 0 %
Eosinophils Absolute: 0.1 10*3/uL (ref 0.0–0.5)
Eosinophils Relative: 1 %
HCT: 38 % (ref 36.0–46.0)
Hemoglobin: 12.9 g/dL (ref 12.0–15.0)
Immature Granulocytes: 1 %
Lymphocytes Relative: 7 %
Lymphs Abs: 0.9 10*3/uL (ref 0.7–4.0)
MCH: 31.6 pg (ref 26.0–34.0)
MCHC: 33.9 g/dL (ref 30.0–36.0)
MCV: 93.1 fL (ref 80.0–100.0)
Monocytes Absolute: 0.6 10*3/uL (ref 0.1–1.0)
Monocytes Relative: 5 %
Neutro Abs: 11.3 10*3/uL — ABNORMAL HIGH (ref 1.7–7.7)
Neutrophils Relative %: 86 %
Platelet Count: 285 10*3/uL (ref 150–400)
RBC: 4.08 MIL/uL (ref 3.87–5.11)
RDW: 13.4 % (ref 11.5–15.5)
WBC Count: 12.9 10*3/uL — ABNORMAL HIGH (ref 4.0–10.5)
nRBC: 0 % (ref 0.0–0.2)

## 2020-08-17 LAB — BASIC METABOLIC PANEL - CANCER CENTER ONLY
Anion gap: 6 (ref 5–15)
BUN: 15 mg/dL (ref 8–23)
CO2: 29 mmol/L (ref 22–32)
Calcium: 9.5 mg/dL (ref 8.9–10.3)
Chloride: 100 mmol/L (ref 98–111)
Creatinine: 1.15 mg/dL — ABNORMAL HIGH (ref 0.44–1.00)
GFR, Estimated: 52 mL/min — ABNORMAL LOW (ref >60)
Glucose, Bld: 105 mg/dL — ABNORMAL HIGH (ref 70–99)
Potassium: 4.5 mmol/L (ref 3.5–5.1)
Sodium: 135 mmol/L (ref 135–145)

## 2020-08-17 LAB — MAGNESIUM: Magnesium: 2 mg/dL (ref 1.7–2.4)

## 2020-08-17 MED ORDER — SODIUM CHLORIDE 0.9% FLUSH
10.0000 mL | INTRAVENOUS | Status: DC | PRN
  Administered 2020-08-17: 10 mL
  Filled 2020-08-17: qty 10

## 2020-08-17 MED ORDER — POTASSIUM CHLORIDE IN NACL 20-0.9 MEQ/L-% IV SOLN
Freq: Once | INTRAVENOUS | Status: AC
Start: 2020-08-17 — End: 2020-08-17
  Filled 2020-08-17: qty 1000

## 2020-08-17 MED ORDER — SODIUM CHLORIDE 0.9 % IV SOLN
40.0000 mg/m2 | Freq: Once | INTRAVENOUS | Status: AC
  Administered 2020-08-17: 74 mg via INTRAVENOUS
  Filled 2020-08-17: qty 74

## 2020-08-17 MED ORDER — MAGNESIUM SULFATE 2 GM/50ML IV SOLN
INTRAVENOUS | Status: AC
  Filled 2020-08-17: qty 50

## 2020-08-17 MED ORDER — PALONOSETRON HCL INJECTION 0.25 MG/5ML
0.2500 mg | Freq: Once | INTRAVENOUS | Status: AC
  Administered 2020-08-17: 0.25 mg via INTRAVENOUS

## 2020-08-17 MED ORDER — FOSAPREPITANT DIMEGLUMINE INJECTION 150 MG
150.0000 mg | Freq: Once | INTRAVENOUS | Status: AC
  Administered 2020-08-17: 150 mg via INTRAVENOUS
  Filled 2020-08-17: qty 150

## 2020-08-17 MED ORDER — MAGNESIUM SULFATE 2 GM/50ML IV SOLN
2.0000 g | Freq: Once | INTRAVENOUS | Status: AC
Start: 2020-08-17 — End: 2020-08-17
  Administered 2020-08-17: 2 g via INTRAVENOUS

## 2020-08-17 MED ORDER — SODIUM CHLORIDE 0.9% FLUSH
10.0000 mL | INTRAVENOUS | Status: AC | PRN
Start: 2020-08-17 — End: 2020-08-17
  Administered 2020-08-17: 10 mL
  Filled 2020-08-17: qty 10

## 2020-08-17 MED ORDER — PALONOSETRON HCL INJECTION 0.25 MG/5ML
INTRAVENOUS | Status: AC
  Filled 2020-08-17: qty 5

## 2020-08-17 MED ORDER — SODIUM CHLORIDE 0.9 % IV SOLN
Freq: Once | INTRAVENOUS | Status: AC
  Filled 2020-08-17: qty 250

## 2020-08-17 MED ORDER — SODIUM CHLORIDE 0.9 % IV SOLN
10.0000 mg | Freq: Once | INTRAVENOUS | Status: AC
  Administered 2020-08-17: 10 mg via INTRAVENOUS
  Filled 2020-08-17: qty 10

## 2020-08-17 MED ORDER — HEPARIN SOD (PORK) LOCK FLUSH 100 UNIT/ML IV SOLN
500.0000 [IU] | Freq: Once | INTRAVENOUS | Status: AC | PRN
Start: 2020-08-17 — End: 2020-08-17
  Administered 2020-08-17: 500 [IU]
  Filled 2020-08-17: qty 5

## 2020-08-17 MED ORDER — MAGNESIUM SULFATE 50 % IJ SOLN: Freq: Once | INTRAMUSCULAR | Status: DC

## 2020-08-17 NOTE — Patient Instructions (Addendum)
[facility] ONCOLOGY  Discharge Instructions: Thank you for choosing [facility] to provide your oncology and hematology care.   If you have a lab appointment with the [facility], please go directly to the [facility] and check in at the registration area.   Wear comfortable clothing and clothing appropriate for easy access to any Portacath or PICC line.   We strive to give you quality time with your provider. You may need to reschedule your appointment if you arrive late (15 or more minutes).  Arriving late affects you and other patients whose appointments are after yours.  Also, if you miss three or more appointments without notifying the office, you may be dismissed from the clinic at the provider's discretion.      For prescription refill requests, have your pharmacy contact our office and allow 72 hours for refills to be completed.    Today you received the following chemotherapy and/or immunotherapy agents Cisplatin (Paraplatin).      To help prevent nausea and vomiting after your treatment, we encourage you to take your nausea medication as directed.  BELOW ARE SYMPTOMS THAT SHOULD BE REPORTED IMMEDIATELY: *FEVER GREATER THAN 100.4 F (38 C) OR HIGHER *CHILLS OR SWEATING *NAUSEA AND VOMITING THAT IS NOT CONTROLLED WITH YOUR NAUSEA MEDICATION *UNUSUAL SHORTNESS OF BREATH *UNUSUAL BRUISING OR BLEEDING *URINARY PROBLEMS (pain or burning when urinating, or frequent urination) *BOWEL PROBLEMS (unusual diarrhea, constipation, pain near the anus) TENDERNESS IN MOUTH AND THROAT WITH OR WITHOUT PRESENCE OF ULCERS (sore throat, sores in mouth, or a toothache) UNUSUAL RASH, SWELLING OR PAIN  UNUSUAL VAGINAL DISCHARGE OR ITCHING   Items with * indicate a potential emergency and should be followed up as soon as possible or go to the Emergency Department if any problems should occur.  Please show the CHEMOTHERAPY ALERT CARD or IMMUNOTHERAPY ALERT CARD at  check-in to the Emergency Department and triage nurse.  Should you have questions after your visit or need to cancel or reschedule your appointment, please contact [facility]  Dept: [phone]  and follow the prompts.  Office hours are 8:00 a.m. to 4:30 p.m. Monday - Friday. Please note that voicemails left after 4:00 p.m. may not be returned until the following business day.  We are closed weekends and major holidays. You have access to a nurse at all times for urgent questions. Please call the main number to the clinic Dept: [phone] and follow the prompts.   For any non-urgent questions, you may also contact your provider using MyChart. We now offer e-Visits for anyone 56 and older to request care online for non-urgent symptoms. For details visit [email or web address].   Also download the MyChart app! Go to the app store, search "MyChart", open the app, select [facility], and log in with your MyChart username and password.  Due to Covid, a mask is required upon entering the hospital/clinic. If you do not have a mask, one will be given to you upon arrival. For doctor visits, patients may have 1 support person aged 85 or older with them. For treatment visits, patients cannot have anyone with them due to current Covid guidelines and our immunocompromised population.

## 2020-08-17 NOTE — Progress Notes (Signed)
Pt here today for Cycle 3 Day 1 of Cisplatin with 90meq K+ w/2gm Mg+ Pre and post hydration.    PAC accessed prior to arrival to [facility] by [facility].  Flushed PAC with 52ml NS without resistance and brisk blood return visualized prior to flush.  See IV Assessment Flowsheet.  Pt denied distress.  Pt A&O x4.  Pt has a steady gait and ambulates without assistance.  VS are WNL.  Pt denies neuropathy.  Pt c/o 2 out of 10 fatigue.  Pt c/o moderate changes in eating d/t taste bud changes.  Pt encouraged to hydrated often.  Premedications were given as ordered.  Cisplatin given as ordered by the provider.  Pt tolerated the infusion well.  PAC flushed with 38ml NS without resistance and brisk blood return visualized prior to flush.  Heparin locked PAC.  Occlusive dressing removed.  Gauze, pressure, and bandaid applied to site.  Pt ambulated out of the Infusion Clinic stable.

## 2020-08-17 NOTE — Assessment & Plan Note (Signed)
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 She is here for cycle 3 of planned chemotherapy.  Review of systems from last week are pertinent for fatigue and some sore throat. Physical examination, mild erythema noted in the oral mucosa, no ulceration.  G-tube and port site appears well.  No concern for infection. CBC reviewed and okay to proceed.  Rest of the metabolic panel is pending at this time.  Okay to proceed with week 3 as planned if labs are within parameters.

## 2020-08-17 NOTE — Progress Notes (Signed)
Nutrition follow-up completed with patient during infusion for cancer of the oropharynx.  She is receiving concurrent chemoradiation therapy with cisplatin.  She is status post PEG/port placement on June 1.  Weight is stable and documented as 158.6 pounds on June 21.  She is still having pain around her feeding tube.  She is successfully flushing her feeding tube daily without any difficulty.  She is not using it for nutrition at this point.  Patient declines trying a sample carton of Osmolite 1.5 via tube today.  Reports she eats what she wants to eat however she is noticing some increased throat soreness.  She has not tried oral nutrition supplements but is willing to begin using oral nutrition supplements for additional calories and protein as oral intake is decreasing.  Estimated nutrition needs: 2070-2215 cal, 110-125 g protein, 2.2 L fluid.    Nutrition diagnosis: Predicted suboptimal energy intake continues.  Intervention: Continue flushing feeding tube daily. Add 1-2 cartons oral nutrition supplements daily.  Try to eat small amounts throughout the day. Continue bowel regimen. Take antiemetics as needed. Try 1 carton of Osmolite 1.5 via feeding tube at home and assess tolerance.  Monitoring, evaluation, goals: Patient will tolerate adequate calories and protein for minimal weight loss.  Next visit: Tuesday, June 28 during infusion.  **Disclaimer: This note was dictated with voice recognition software. Similar sounding words can inadvertently be transcribed and this note may contain transcription errors which may not have been corrected upon publication of note.**

## 2020-08-17 NOTE — Assessment & Plan Note (Signed)
Mucositis related to concurrent chemotherapy and radiation.  Mild at this time, can continue using viscous lidocaine.  Will reevaluate it weekly.

## 2020-08-17 NOTE — Assessment & Plan Note (Signed)
She is taking oxycodone 1 to 2 tablets a day for cancer related pain.  No increase in pain medicine needs at this time.  We will continue to monitor.

## 2020-08-18 ENCOUNTER — Ambulatory Visit
Admission: RE | Admit: 2020-08-18 | Discharge: 2020-08-18 | Disposition: A | Discharge status: Home / Self Care | Payer: Medicare Other | Source: Ambulatory Visit | Attending: Radiation Oncology | Admitting: Radiation Oncology

## 2020-08-18 ENCOUNTER — Other Ambulatory Visit: Payer: Self-pay | Admitting: Hematology and Oncology

## 2020-08-18 ENCOUNTER — Other Ambulatory Visit: Payer: Self-pay

## 2020-08-18 DIAGNOSIS — C090 Malignant neoplasm of tonsillar fossa: Secondary | ICD-10-CM | POA: Diagnosis not present

## 2020-08-19 ENCOUNTER — Other Ambulatory Visit: Payer: Self-pay

## 2020-08-19 ENCOUNTER — Ambulatory Visit
Admission: RE | Admit: 2020-08-19 | Discharge: 2020-08-19 | Disposition: A | Discharge status: Home / Self Care | Payer: Medicare Other | Source: Ambulatory Visit | Attending: Radiation Oncology | Admitting: Radiation Oncology

## 2020-08-19 DIAGNOSIS — C090 Malignant neoplasm of tonsillar fossa: Secondary | ICD-10-CM | POA: Diagnosis not present

## 2020-08-20 ENCOUNTER — Ambulatory Visit
Admission: RE | Admit: 2020-08-20 | Discharge: 2020-08-20 | Disposition: A | Discharge status: Home / Self Care | Payer: Medicare Other | Source: Ambulatory Visit | Attending: Radiation Oncology | Admitting: Radiation Oncology

## 2020-08-20 ENCOUNTER — Other Ambulatory Visit: Payer: Self-pay

## 2020-08-20 DIAGNOSIS — C090 Malignant neoplasm of tonsillar fossa: Secondary | ICD-10-CM | POA: Diagnosis not present

## 2020-08-22 ENCOUNTER — Other Ambulatory Visit: Payer: Self-pay | Admitting: Hematology

## 2020-08-23 ENCOUNTER — Other Ambulatory Visit: Payer: Self-pay | Admitting: Hematology and Oncology

## 2020-08-23 ENCOUNTER — Telehealth: Payer: Self-pay | Admitting: Hematology and Oncology

## 2020-08-23 ENCOUNTER — Other Ambulatory Visit: Payer: Self-pay

## 2020-08-23 ENCOUNTER — Inpatient Hospital Stay: Payer: Medicare Other

## 2020-08-23 ENCOUNTER — Ambulatory Visit
Admission: RE | Admit: 2020-08-23 | Discharge: 2020-08-23 | Disposition: A | Discharge status: Home / Self Care | Payer: Medicare Other | Source: Ambulatory Visit | Attending: Radiation Oncology | Admitting: Radiation Oncology

## 2020-08-23 DIAGNOSIS — Z5111 Encounter for antineoplastic chemotherapy: Secondary | ICD-10-CM | POA: Diagnosis not present

## 2020-08-23 DIAGNOSIS — C109 Malignant neoplasm of oropharynx, unspecified: Secondary | ICD-10-CM

## 2020-08-23 DIAGNOSIS — C090 Malignant neoplasm of tonsillar fossa: Secondary | ICD-10-CM | POA: Diagnosis not present

## 2020-08-23 DIAGNOSIS — Z95828 Presence of other vascular implants and grafts: Secondary | ICD-10-CM

## 2020-08-23 LAB — CBC WITH DIFFERENTIAL (CANCER CENTER ONLY)
Abs Immature Granulocytes: 0.02 10*3/uL (ref 0.00–0.07)
Basophils Absolute: 0 10*3/uL (ref 0.0–0.1)
Basophils Relative: 0 %
Eosinophils Absolute: 0 10*3/uL (ref 0.0–0.5)
Eosinophils Relative: 0 %
HCT: 36.2 % (ref 36.0–46.0)
Hemoglobin: 12.5 g/dL (ref 12.0–15.0)
Immature Granulocytes: 0 %
Lymphocytes Relative: 15 %
Lymphs Abs: 0.9 10*3/uL (ref 0.7–4.0)
MCH: 32.3 pg (ref 26.0–34.0)
MCHC: 34.5 g/dL (ref 30.0–36.0)
MCV: 93.5 fL (ref 80.0–100.0)
Monocytes Absolute: 0.4 10*3/uL (ref 0.1–1.0)
Monocytes Relative: 6 %
Neutro Abs: 4.7 10*3/uL (ref 1.7–7.7)
Neutrophils Relative %: 79 %
Platelet Count: 168 10*3/uL (ref 150–400)
RBC: 3.87 MIL/uL (ref 3.87–5.11)
RDW: 13.2 % (ref 11.5–15.5)
WBC Count: 6.1 10*3/uL (ref 4.0–10.5)
nRBC: 0 % (ref 0.0–0.2)

## 2020-08-23 LAB — BASIC METABOLIC PANEL - CANCER CENTER ONLY
Anion gap: 10 (ref 5–15)
BUN: 15 mg/dL (ref 8–23)
CO2: 25 mmol/L (ref 22–32)
Calcium: 9.3 mg/dL (ref 8.9–10.3)
Chloride: 102 mmol/L (ref 98–111)
Creatinine: 0.94 mg/dL (ref 0.44–1.00)
GFR, Estimated: >60 mL/min (ref >60)
Glucose, Bld: 86 mg/dL (ref 70–99)
Potassium: 4.1 mmol/L (ref 3.5–5.1)
Sodium: 137 mmol/L (ref 135–145)

## 2020-08-23 LAB — MAGNESIUM: Magnesium: 1.9 mg/dL (ref 1.7–2.4)

## 2020-08-23 MED ORDER — SODIUM CHLORIDE 0.9% FLUSH
10.0000 mL | INTRAVENOUS | Status: AC | PRN
  Administered 2020-08-23: 10 mL
  Filled 2020-08-23: qty 10

## 2020-08-23 MED ORDER — HEPARIN SOD (PORK) LOCK FLUSH 100 UNIT/ML IV SOLN
500.0000 [IU] | INTRAVENOUS | Status: AC | PRN
Start: 2020-08-23 — End: 2020-08-23
  Administered 2020-08-23: 500 [IU]
  Filled 2020-08-23: qty 5

## 2020-08-23 NOTE — Telephone Encounter (Signed)
Scheduled appt per 6/26 sch msg. Called pt, no answer. Left msg with appt date and time.

## 2020-08-24 ENCOUNTER — Inpatient Hospital Stay: Payer: Medicare Other | Admitting: Nutrition

## 2020-08-24 ENCOUNTER — Inpatient Hospital Stay (HOSPITAL_BASED_OUTPATIENT_CLINIC_OR_DEPARTMENT_OTHER): Payer: Medicare Other | Admitting: Nurse Practitioner

## 2020-08-24 ENCOUNTER — Ambulatory Visit
Admission: RE | Admit: 2020-08-24 | Discharge: 2020-08-24 | Disposition: A | Discharge status: Home / Self Care | Payer: Medicare Other | Source: Ambulatory Visit | Attending: Radiation Oncology | Admitting: Radiation Oncology

## 2020-08-24 ENCOUNTER — Encounter: Payer: Self-pay | Admitting: Nurse Practitioner

## 2020-08-24 ENCOUNTER — Inpatient Hospital Stay: Payer: Medicare Other

## 2020-08-24 VITALS — BP 122/76 | HR 103 | Temp 97.3°F | Resp 18 | Ht 66.0 in | Wt 154.3 lb

## 2020-08-24 VITALS — HR 81

## 2020-08-24 DIAGNOSIS — C109 Malignant neoplasm of oropharynx, unspecified: Secondary | ICD-10-CM

## 2020-08-24 DIAGNOSIS — C090 Malignant neoplasm of tonsillar fossa: Secondary | ICD-10-CM | POA: Diagnosis not present

## 2020-08-24 DIAGNOSIS — Z5111 Encounter for antineoplastic chemotherapy: Secondary | ICD-10-CM | POA: Diagnosis not present

## 2020-08-24 MED ORDER — ACETAMINOPHEN 325 MG PO TABS
ORAL_TABLET | ORAL | Status: AC
  Filled 2020-08-24: qty 2

## 2020-08-24 MED ORDER — SODIUM CHLORIDE 0.9 % IV SOLN
Freq: Once | INTRAVENOUS | Status: AC
  Filled 2020-08-24: qty 250

## 2020-08-24 MED ORDER — FOSAPREPITANT DIMEGLUMINE INJECTION 150 MG
150.0000 mg | Freq: Once | INTRAVENOUS | Status: AC
  Administered 2020-08-24: 150 mg via INTRAVENOUS
  Filled 2020-08-24: qty 150

## 2020-08-24 MED ORDER — SODIUM CHLORIDE 0.9 % IV SOLN
40.0000 mg/m2 | Freq: Once | INTRAVENOUS | Status: AC
  Administered 2020-08-24: 74 mg via INTRAVENOUS
  Filled 2020-08-24: qty 74

## 2020-08-24 MED ORDER — ACETAMINOPHEN 325 MG PO TABS
650.0000 mg | ORAL_TABLET | Freq: Once | ORAL | Status: AC
  Administered 2020-08-24: 650 mg via ORAL

## 2020-08-24 MED ORDER — SODIUM CHLORIDE 0.9 % IV SOLN
10.0000 mg | Freq: Once | INTRAVENOUS | Status: AC
  Administered 2020-08-24: 10 mg via INTRAVENOUS
  Filled 2020-08-24: qty 10

## 2020-08-24 MED ORDER — SODIUM CHLORIDE 0.9% FLUSH
10.0000 mL | INTRAVENOUS | Status: DC | PRN
  Filled 2020-08-24: qty 10

## 2020-08-24 MED ORDER — MAGNESIUM SULFATE 2 GM/50ML IV SOLN
2.0000 g | Freq: Once | INTRAVENOUS | Status: AC
  Administered 2020-08-24: 2 g via INTRAVENOUS

## 2020-08-24 MED ORDER — OXYCODONE HCL 5 MG PO TABS
5.0000 mg | ORAL_TABLET | Freq: Once | ORAL | Status: AC
  Administered 2020-08-24: 5 mg via ORAL

## 2020-08-24 MED ORDER — POTASSIUM CHLORIDE IN NACL 20-0.9 MEQ/L-% IV SOLN
Freq: Once | INTRAVENOUS | Status: AC
  Filled 2020-08-24: qty 1000

## 2020-08-24 MED ORDER — OXYCODONE HCL 5 MG PO TABS
ORAL_TABLET | ORAL | Status: AC
  Filled 2020-08-24: qty 1

## 2020-08-24 MED ORDER — MAGNESIUM SULFATE 2 GM/50ML IV SOLN
INTRAVENOUS | Status: AC
  Filled 2020-08-24: qty 50

## 2020-08-24 MED ORDER — SUCRALFATE 1 GM/10ML PO SUSP: 1.0000 g | Freq: Three times a day (TID) | ORAL | 2 refills | Status: DC

## 2020-08-24 MED ORDER — PALONOSETRON HCL INJECTION 0.25 MG/5ML
INTRAVENOUS | Status: AC
  Filled 2020-08-24: qty 5

## 2020-08-24 MED ORDER — HEPARIN SOD (PORK) LOCK FLUSH 100 UNIT/ML IV SOLN
500.0000 [IU] | Freq: Once | INTRAVENOUS | Status: DC | PRN
  Filled 2020-08-24: qty 5

## 2020-08-24 MED ORDER — OXYCODONE HCL 10 MG PO TABS: 10.0000 mg | ORAL_TABLET | Freq: Three times a day (TID) | ORAL | 0 refills | Status: DC | PRN

## 2020-08-24 MED ORDER — SODIUM CHLORIDE 0.9 % IV SOLN: 30.0000 mg/m2 | Freq: Once | INTRAVENOUS | Status: DC

## 2020-08-24 MED ORDER — PALONOSETRON HCL INJECTION 0.25 MG/5ML
0.2500 mg | Freq: Once | INTRAVENOUS | Status: AC
  Administered 2020-08-24: 0.25 mg via INTRAVENOUS

## 2020-08-24 NOTE — Patient Instructions (Signed)
[facility]  Discharge Instructions: Thank you for choosing [facility] to provide your oncology and hematology care.   If you have a lab appointment with the Cancer Center, please go directly to the Cancer Center and check in at the registration area.   Wear comfortable clothing and clothing appropriate for easy access to any Portacath or PICC line.   We strive to give you quality time with your provider. You may need to reschedule your appointment if you arrive late (15 or more minutes).  Arriving late affects you and other patients whose appointments are after yours.  Also, if you miss three or more appointments without notifying the office, you may be dismissed from the clinic at the provider's discretion.      For prescription refill requests, have your pharmacy contact our office and allow 72 hours for refills to be completed.    Today you received the following chemotherapy and/or immunotherapy agents : Cisplatin    To help prevent nausea and vomiting after your treatment, we encourage you to take your nausea medication as directed.  BELOW ARE SYMPTOMS THAT SHOULD BE REPORTED IMMEDIATELY: *FEVER GREATER THAN 100.4 F (38 C) OR HIGHER *CHILLS OR SWEATING *NAUSEA AND VOMITING THAT IS NOT CONTROLLED WITH YOUR NAUSEA MEDICATION *UNUSUAL SHORTNESS OF BREATH *UNUSUAL BRUISING OR BLEEDING *URINARY PROBLEMS (pain or burning when urinating, or frequent urination) *BOWEL PROBLEMS (unusual diarrhea, constipation, pain near the anus) TENDERNESS IN MOUTH AND THROAT WITH OR WITHOUT PRESENCE OF ULCERS (sore throat, sores in mouth, or a toothache) UNUSUAL RASH, SWELLING OR PAIN  UNUSUAL VAGINAL DISCHARGE OR ITCHING   Items with * indicate a potential emergency and should be followed up as soon as possible or go to the Emergency Department if any problems should occur.  Please show the CHEMOTHERAPY ALERT CARD or IMMUNOTHERAPY ALERT CARD at check-in to  the Emergency Department and triage nurse.  Should you have questions after your visit or need to cancel or reschedule your appointment, please contact [facility]  Dept: [phone]  and follow the prompts.  Office hours are 8:00 a.m. to 4:30 p.m. Monday - Friday. Please note that voicemails left after 4:00 p.m. may not be returned until the following business day.  We are closed weekends and major holidays. You have access to a nurse at all times for urgent questions. Please call the main number to the clinic Dept: [phone] and follow the prompts.   For any non-urgent questions, you may also contact your provider using MyChart. We now offer e-Visits for anyone 18 and older to request care online for non-urgent symptoms. For details visit [email or web address].   Also download the MyChart app! Go to the app store, search "MyChart", open the app, select [facility], and log in with your MyChart username and password.  Due to Covid, a mask is required upon entering the hospital/clinic. If you do not have a mask, one will be given to you upon arrival. For doctor visits, patients may have 1 support person aged 18 or older with them. For treatment visits, patients cannot have anyone with them due to current Covid guidelines and our immunocompromised population.   

## 2020-08-24 NOTE — Progress Notes (Signed)
[facility]   [phone] [phone]   Clinic Follow up Note   Patient Care Team: Alphia Moh, MD as PCP - General (Family Medicine) 08/24/2020  CHIEF COMPLAINT: F/up SCC of oropharynx  CURRENT THERAPY: ccRT with weekly cisplatin, starting 08/04/20  INTERVAL HISTORY: Ms. Rathel returns for follow up and treatment as scheduled. She completed week 3.  Feels more tired but up and active at home.  She has throat pain 8/10, did not take pain meds today. Takes oxycodone 1-2 times daily which is partially effective. Viscous lidocaine does not help.  She has difficulty swallowing at times, pills go down okay, but having "terrible difficulty" with painful swallowing.  She is on a soft diet, has not used G tube yet but has supplies and knows what to do.  She has mild nausea mostly in the morning, and constipation relieved by laxative in the evening.  Last BM today was her first in a week.  She has occasional tingling in her hands with cold exposure.  Denies fever, chills, cough, chest pain, dyspnea.   MEDICAL HISTORY:  Past Medical History:  Diagnosis Date   Cancer [facility])    Hypertension     SURGICAL HISTORY: Past Surgical History:  Procedure Laterality Date   IR CM INJ ANY COLONIC TUBE W/FLUORO  08/09/2020   IR GASTROSTOMY TUBE MOD SED  07/28/2020   IR IMAGING GUIDED PORT INSERTION  07/28/2020    I have reviewed the social history and family history with the patient and they are unchanged from previous note.  ALLERGIES:  is allergic to atorvastatin.  MEDICATIONS:  Current Outpatient Medications  Medication Sig Dispense Refill   albuterol (VENTOLIN HFA) 108 (90 Base) MCG/ACT inhaler Inhale 1-2 puffs into the lungs every 6 (six) hours as needed for wheezing or shortness of breath.     amLODipine (NORVASC) 5 MG tablet Take 5 mg by mouth daily.     aspirin 81 MG chewable tablet Chew 81 mg by mouth daily.     buPROPion (WELLBUTRIN SR) 150 MG 12 hr tablet  Start one week before quit date. Take 1 tab daily x 3 days, then 1 tab BID thereafter. (Patient taking differently: Take 150 mg by mouth daily.) 60 tablet 2   calcium citrate-vitamin D (CITRACAL+D) 315-200 MG-UNIT tablet Take 1 tablet by mouth 2 (two) times daily.     carvedilol (COREG) 6.25 MG tablet Take 6.25 mg by mouth 2 (two) times daily with a meal.     Cholecalciferol (VITAMIN D3) 125 MCG (5000 UT) CAPS Take 5,000 Units by mouth daily.     co-enzyme Q-10 30 MG capsule Take 30 mg by mouth daily.     cyclobenzaprine (FLEXERIL) 10 MG tablet Take 10 mg by mouth 3 (three) times daily as needed for muscle spasms.     dexamethasone (DECADRON) 4 MG tablet Take 2 tablets (8 mg total) by mouth daily. Take daily x 3 days starting the day after cisplatin chemotherapy. Take with food. 30 tablet 1   diphenoxylate-atropine (LOMOTIL) 2.5-0.025 MG tablet Take 1 tablet by mouth 4 (four) times daily as needed for diarrhea or loose stools. 30 tablet 0   Evolocumab (REPATHA SURECLICK) 366 MG/ML SOAJ Inject 140 mg into the skin every 14 (fourteen) days.     Flaxseed, Linseed, (FLAXSEED OIL) 1000 MG CAPS Take 2,000 mg by mouth daily.     lidocaine (XYLOCAINE) 2 % solution Patient: Mix 1part 2% viscous lidocaine, 1part H20. Swish & swallow 4mL  of diluted mixture, 22min before meals and at bedtime, up to QID 200 mL 3   lidocaine-prilocaine (EMLA) cream Apply to affected area once 30 g 3   lisinopril (ZESTRIL) 20 MG tablet Take 1 tablet by mouth daily.     Multiple Vitamins-Minerals (MULTIVITAMIN WOMEN 50+ PO) Take 1 tablet by mouth daily.     nicotine (NICODERM CQ - DOSED IN MG/24 HOURS) 21 mg/24hr patch Place 21 mg onto the skin daily.     Nutritional Supplements (FEEDING SUPPLEMENT, OSMOLITE 1.5 CAL,) LIQD Osmolite 1.5 x 6 cartons/day - Give 1 1/2 cartons via PEG QID. Flush tube with 60 ml water before and after each feeding. Drink by mouth or give via tube additional 720 ml (3c.) water daily. 1422 mL 0    omeprazole (PRILOSEC) 20 MG capsule Take 20 mg by mouth daily.     ondansetron (ZOFRAN) 8 MG tablet Take 1 tablet (8 mg total) by mouth 2 (two) times daily as needed. Start on the third day after cisplatin chemotherapy. 30 tablet 1   Oxycodone HCl 10 MG TABS Take 1 tablet (10 mg total) by mouth 2 (two) times daily as needed for up to 60 doses (cancer pain). 60 tablet 0   potassium chloride (KLOR-CON) 10 MEQ tablet Take 10 mEq by mouth daily.     predniSONE (DELTASONE) 20 MG tablet Take 20 mg by mouth daily as needed (COPD).     prochlorperazine (COMPAZINE) 10 MG tablet Take 1 tablet (10 mg total) by mouth every 6 (six) hours as needed (Nausea or vomiting). 30 tablet 1   sharps container Use as directed for sharps disposal     SPIRIVA RESPIMAT 2.5 MCG/ACT AERS Inhale 2 puffs into the lungs daily.     vitamin B-12 (CYANOCOBALAMIN) 500 MCG tablet Take 500 mcg by mouth daily.     Current Facility-Administered Medications  Medication Dose Route Frequency Provider Last Rate Last Admin   acetaminophen (TYLENOL) tablet 650 mg  650 mg Oral Once Alla Feeling, NP       And   oxyCODONE (Oxy IR/ROXICODONE) immediate release tablet 5 mg  5 mg Oral Once Alla Feeling, NP        PHYSICAL EXAMINATION: ECOG PERFORMANCE STATUS: 1 - Symptomatic but completely ambulatory  Vitals:   08/24/20 0928  BP: 122/76  Pulse: (!) 103  Resp: 18  Temp: (!) 97.3 F (36.3 C)  SpO2: 100%   Filed Weights   08/24/20 0928  Weight: 154 lb 4.8 oz (70 kg)    GENERAL:alert, no distress and comfortable SKIN: No rash EYES: sclera clear OROPHARYNX: pharyngeal erythema with white patches LUNGS:  normal breathing effort HEART: regular rate & rhythm, no lower extremity edema ABDOMEN: G-tube site clean dry and intact without erythema or drainage NEURO: alert & oriented x 3 with fluent speech, no focal motor/sensory deficits PAC without erythema  LABORATORY DATA:  I have reviewed the data as listed CBC Latest Ref Rng  & Units 08/23/2020 08/17/2020 08/10/2020  WBC 4.0 - 10.5 K/uL 6.1 12.9(H) 13.3(H)  Hemoglobin 12.0 - 15.0 g/dL 12.5 12.9 13.8  Hematocrit 36.0 - 46.0 % 36.2 38.0 40.8  Platelets 150 - 400 K/uL 168 285 329     CMP Latest Ref Rng & Units 08/23/2020 08/17/2020 08/10/2020  Glucose 70 - 99 mg/dL 86 105(H) 107(H)  BUN 8 - 23 mg/dL 15 15 15   Creatinine 0.44 - 1.00 mg/dL 0.94 1.15(H) 0.96  Sodium 135 - 145 mmol/L 137 135 137  Potassium 3.5 - 5.1 mmol/L 4.1 4.5 4.3  Chloride 98 - 111 mmol/L 102 100 101  CO2 22 - 32 mmol/L 25 29 26   Calcium 8.9 - 10.3 mg/dL 9.3 9.5 9.2  Total Protein 6.5 - 8.1 g/dL - - -  Total Bilirubin 0.3 - 1.2 mg/dL - - -  Alkaline Phos 38 - 126 U/L - - -  AST 15 - 41 U/L - - -  ALT 0 - 44 U/L - - -      RADIOGRAPHIC STUDIES: I have personally reviewed the radiological images as listed and agreed with the findings in the report. No results found.   ASSESSMENT & PLAN: 70 year old female  Squamous cell carcinoma of the oropharynx, T2 N2 cM0, p16 negative -Began concurrent chemoradiation with cisplatin on 08/04/2020 -Tolerating chemo with fatigue, nausea, constipation -Progressing through RT expectantly with dysphagia and odynophagia s/p week 3, 4 pounds weight loss -Begin sucralfate, increase oxycodone from twice daily to every 8 hours as needed, begin using G-tube for nutrition and hydration -Follow-up with dietitian  Disposition: Mrs. Leventhal appears stable.  She is completed week 3 cc RT with cisplatin.  Tolerates treatment well with fatigue, nausea, and constipation.  Side effects are well managed with supportive care at home.  She is able to recover and function well.    She has worsening dysphagia and odynophagia secondary to radiation, with 4 pounds weight loss in 1 week. I prescribed sucralfate and increased oxycodone to q8h PRN.  She will receive Percocet x1 in clinic for throat pain 8/10, did not bring meds today.  She drove herself, but will be here at least 6  hours for chemo and radiation.   Due to her progressive dysphagia and weight loss, I recommend to begin using G-tube for nutrition and hydration, follow-up with Dory Peru today as scheduled.  Pt declined scheduling IV fluids at this time, but knows to call sooner if she feels dehydrated.  Labs reviewed, adequate to proceed with cycle 4 cisplatin today as planned, continue RT.  Lab and follow-up next week with cycle 5.  All questions were answered. The patient knows to call the clinic with any problems, questions or concerns. No barriers to learning were detected. Total encounter time was 30 minutes.      Alla Feeling, NP 08/24/20

## 2020-08-24 NOTE — Progress Notes (Addendum)
Reviewed Cisplatin dose with Lacie after office visit with patient. Increase Cisplatin to 40mg /m2. 6/27 Scr improved to 0.94  Raul Del [place], [place], BCPS, BCOP 08/24/2020 12:16 PM

## 2020-08-24 NOTE — Progress Notes (Signed)
Nutrition follow-up completed with patient during infusion for cancer of the oropharynx.  She continues to receive concurrent chemoradiation therapy using cisplatin.  She is status post PEG/port on June 1.  Weight decreased and documented as 154 pounds on June 28, decreased from 158.6 pounds June 21.  She reports she developed increased pain and inability to swallow literally overnight.  She states today she can hardly swallow water.  She is agreeing to begin using her PEG for nutrition support but declines doing a feeding while she is here today.  She is hoping pain medications will help her consume a little bit more oral intake.  Estimated nutrition needs: 2070-2215 cal, 110-125 g protein, 2.2 L fluid.  Nutrition diagnosis: Predicted suboptimal energy intake has evolved into inadequate oral intake related to cancer of the oropharynx as evidenced by 4.6 pound weight loss in approximately 1 week.  Intervention: Educated to begin 1 carton Osmolite 1.5, 4 times daily with 60 cc free water before and after each bolus feeding via PEG. Increased to 1-1/2 cartons Osmolite 1.5 4 times daily as tolerated. Give an additional 240 mL free water 3 times daily between tube feedings. Provides 2130 cal, 89.4 g protein, 2286 mL free water.  This is 100% estimated calorie needs and 81% estimated protein needs.  Will evaluate for additional protein needs after patient achieves goal rate of tube feeding.  Provided written information.  Teach back method was used and questions were answered.  Patient already has formula ordered through [employer].  She contacted them today and they are going to deliver her supplies.  Monitoring, evaluation, goals: Patient will tolerate adequate calories and protein to meet greater than 90% estimated calorie needs for weight maintenance.  Next visit: Wednesday, July 6 during infusion.  **Disclaimer: This note was dictated with voice recognition software. Similar sounding words can  inadvertently be transcribed and this note may contain transcription errors which may not have been corrected upon publication of note.**

## 2020-08-25 ENCOUNTER — Other Ambulatory Visit: Payer: Self-pay

## 2020-08-25 ENCOUNTER — Ambulatory Visit
Admission: RE | Admit: 2020-08-25 | Discharge: 2020-08-25 | Disposition: A | Discharge status: Home / Self Care | Payer: Medicare Other | Source: Ambulatory Visit | Attending: Radiation Oncology | Admitting: Radiation Oncology

## 2020-08-25 DIAGNOSIS — C090 Malignant neoplasm of tonsillar fossa: Secondary | ICD-10-CM | POA: Diagnosis not present

## 2020-08-26 ENCOUNTER — Ambulatory Visit
Admission: RE | Admit: 2020-08-26 | Discharge: 2020-08-26 | Disposition: A | Discharge status: Home / Self Care | Payer: Medicare Other | Source: Ambulatory Visit | Attending: Radiation Oncology | Admitting: Radiation Oncology

## 2020-08-26 ENCOUNTER — Other Ambulatory Visit: Payer: Self-pay

## 2020-08-26 DIAGNOSIS — C090 Malignant neoplasm of tonsillar fossa: Secondary | ICD-10-CM | POA: Diagnosis not present

## 2020-08-27 ENCOUNTER — Ambulatory Visit
Admission: RE | Admit: 2020-08-27 | Discharge: 2020-08-27 | Disposition: A | Discharge status: Home / Self Care | Payer: Medicare Other | Source: Ambulatory Visit | Attending: Radiation Oncology | Admitting: Radiation Oncology

## 2020-08-27 DIAGNOSIS — C109 Malignant neoplasm of oropharynx, unspecified: Secondary | ICD-10-CM | POA: Insufficient documentation

## 2020-08-27 DIAGNOSIS — C090 Malignant neoplasm of tonsillar fossa: Secondary | ICD-10-CM | POA: Diagnosis not present

## 2020-08-31 ENCOUNTER — Inpatient Hospital Stay: Payer: Medicare Other

## 2020-08-31 ENCOUNTER — Encounter: Payer: Self-pay | Admitting: Hematology and Oncology

## 2020-08-31 ENCOUNTER — Ambulatory Visit
Admission: RE | Admit: 2020-08-31 | Discharge: 2020-08-31 | Disposition: A | Discharge status: Home / Self Care | Payer: Medicare Other | Source: Ambulatory Visit | Attending: Radiation Oncology | Admitting: Radiation Oncology

## 2020-08-31 ENCOUNTER — Inpatient Hospital Stay: Payer: Medicare Other | Attending: Hematology and Oncology | Admitting: Hematology and Oncology

## 2020-08-31 ENCOUNTER — Other Ambulatory Visit: Payer: Self-pay

## 2020-08-31 DIAGNOSIS — Z5111 Encounter for antineoplastic chemotherapy: Secondary | ICD-10-CM | POA: Insufficient documentation

## 2020-08-31 DIAGNOSIS — T451X5A Adverse effect of antineoplastic and immunosuppressive drugs, initial encounter: Secondary | ICD-10-CM | POA: Insufficient documentation

## 2020-08-31 DIAGNOSIS — Z79899 Other long term (current) drug therapy: Secondary | ICD-10-CM | POA: Insufficient documentation

## 2020-08-31 DIAGNOSIS — Z95828 Presence of other vascular implants and grafts: Secondary | ICD-10-CM

## 2020-08-31 DIAGNOSIS — C109 Malignant neoplasm of oropharynx, unspecified: Secondary | ICD-10-CM | POA: Diagnosis not present

## 2020-08-31 DIAGNOSIS — K1232 Oral mucositis (ulcerative) due to other drugs: Secondary | ICD-10-CM | POA: Diagnosis not present

## 2020-08-31 DIAGNOSIS — Z931 Gastrostomy status: Secondary | ICD-10-CM | POA: Diagnosis not present

## 2020-08-31 DIAGNOSIS — E860 Dehydration: Secondary | ICD-10-CM

## 2020-08-31 DIAGNOSIS — C090 Malignant neoplasm of tonsillar fossa: Secondary | ICD-10-CM | POA: Insufficient documentation

## 2020-08-31 DIAGNOSIS — K1231 Oral mucositis (ulcerative) due to antineoplastic therapy: Secondary | ICD-10-CM | POA: Diagnosis not present

## 2020-08-31 DIAGNOSIS — G893 Neoplasm related pain (acute) (chronic): Secondary | ICD-10-CM | POA: Diagnosis not present

## 2020-08-31 LAB — BASIC METABOLIC PANEL - CANCER CENTER ONLY
Anion gap: 8 (ref 5–15)
BUN: 17 mg/dL (ref 8–23)
CO2: 27 mmol/L (ref 22–32)
Calcium: 8.8 mg/dL — ABNORMAL LOW (ref 8.9–10.3)
Chloride: 100 mmol/L (ref 98–111)
Creatinine: 1.07 mg/dL — ABNORMAL HIGH (ref 0.44–1.00)
GFR, Estimated: 57 mL/min — ABNORMAL LOW (ref >60)
Glucose, Bld: 164 mg/dL — ABNORMAL HIGH (ref 70–99)
Potassium: 4.2 mmol/L (ref 3.5–5.1)
Sodium: 135 mmol/L (ref 135–145)

## 2020-08-31 LAB — CBC WITH DIFFERENTIAL (CANCER CENTER ONLY)
Abs Immature Granulocytes: 0.01 10*3/uL (ref 0.00–0.07)
Basophils Absolute: 0 10*3/uL (ref 0.0–0.1)
Basophils Relative: 0 %
Eosinophils Absolute: 0 10*3/uL (ref 0.0–0.5)
Eosinophils Relative: 0 %
HCT: 33.2 % — ABNORMAL LOW (ref 36.0–46.0)
Hemoglobin: 11.6 g/dL — ABNORMAL LOW (ref 12.0–15.0)
Immature Granulocytes: 0 %
Lymphocytes Relative: 10 %
Lymphs Abs: 0.4 10*3/uL — ABNORMAL LOW (ref 0.7–4.0)
MCH: 32.1 pg (ref 26.0–34.0)
MCHC: 34.9 g/dL (ref 30.0–36.0)
MCV: 92 fL (ref 80.0–100.0)
Monocytes Absolute: 0.2 10*3/uL (ref 0.1–1.0)
Monocytes Relative: 4 %
Neutro Abs: 3.2 10*3/uL (ref 1.7–7.7)
Neutrophils Relative %: 86 %
Platelet Count: 106 10*3/uL — ABNORMAL LOW (ref 150–400)
RBC: 3.61 MIL/uL — ABNORMAL LOW (ref 3.87–5.11)
RDW: 13.2 % (ref 11.5–15.5)
WBC Count: 3.8 10*3/uL — ABNORMAL LOW (ref 4.0–10.5)
nRBC: 0 % (ref 0.0–0.2)

## 2020-08-31 LAB — MAGNESIUM: Magnesium: 1.7 mg/dL (ref 1.7–2.4)

## 2020-08-31 MED ORDER — SODIUM CHLORIDE 0.9% FLUSH
10.0000 mL | INTRAVENOUS | Status: AC | PRN
  Administered 2020-08-31: 10 mL
  Filled 2020-08-31: qty 10

## 2020-08-31 MED ORDER — HEPARIN SOD (PORK) LOCK FLUSH 100 UNIT/ML IV SOLN
500.0000 [IU] | INTRAVENOUS | Status: AC | PRN
  Administered 2020-08-31: 500 [IU]
  Filled 2020-08-31: qty 5

## 2020-08-31 NOTE — Progress Notes (Signed)
[facility] CONSULT NOTE  Patient Care Team: Alphia Moh, MD as PCP - General (Family Medicine) Malmfelt, Stephani Police, RN as Oncology Nurse Navigator Charolett Bumpers, Mike Craze, MD as Referring Physician (Otolaryngology) Eppie Gibson, MD as Consulting Physician (Radiation Oncology) Benay Pike, MD as Consulting Physician (Hematology and Oncology)  CHIEF COMPLAINTS/PURPOSE OF CONSULTATION:  Tonsillar cancer follow up  ASSESSMENT & PLAN:   Squamous cell carcinoma of oropharynx [name] with a newly diagnosed squamous cell carcinoma of the oropharynx, T2N2cM0, 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 She is here for cycle 5 of planned chemotherapy.  She is tolerating it well except for fatigue, ongoing mucositis.  She has maintained her weight since last visit, currently using G-tube feedings. No dose-limiting toxicity noted.  No hearing loss, neuropathy or cytopenias that would limit the use of chemotherapy. Okay to proceed with chemotherapy tomorrow as planned if labs are within parameters.  Cancer related pain Patient is currently using oxycodone 2 tablets a day for management of mucositis related to antineoplastic therapy.  Okay to continue this.  We have discussed about liquid hydrocodone versus oxycodone.  She would like to continue current pain management since she has been using it for a long time.  Mucositis due to antineoplastic therapy Continue use of viscous lidocaine and as needed oxycodone.  G-tube feedings given severe mucositis that limit her from swallowing.  No orders of the defined types were placed in this encounter.   HISTORY OF PRESENTING ILLNESS:   Cathy Knight 69 y.o. female is here because of SCC oropharynx, P 16 +  Chronology  This is a very pleasant 70 year old female patient with newly diagnosed oropharyngeal cancer referred to medical oncology for  recommendations.  During her last visit we discussed about concurrent chemoradiation given bilateral disease.  Interval History Patient is here for follow-up prior to week 5 of cisplatin. She is doing well for the most part except for severe mouth pain-sore throat.  She is mostly fed through the G-tube currently.  She can still drink some water.  She is taking about 2 tablets of oxycodone every day for pain management.  She has noticed some tinnitus, had some baseline tinnitus which may have gotten worse with treatment.  No hearing loss.  No problems with using G-tube.  No change in breathing.  She is happy with the response so far in her neck.  Occasional constipation.  No change in urinary habits.  No tingling or numbness of her hands or her feet. Rest of the pertinent 10 point ROS reviewed and negative.  MEDICAL HISTORY:  Past Medical History:  Diagnosis Date   Cancer [facility])    Hypertension     SURGICAL HISTORY: Past Surgical History:  Procedure Laterality Date   IR CM INJ ANY COLONIC TUBE W/FLUORO  08/09/2020   IR GASTROSTOMY TUBE MOD SED  07/28/2020   IR IMAGING GUIDED PORT INSERTION  07/28/2020    SOCIAL HISTORY: Social History   Socioeconomic History   Marital status: Single    Spouse name: Not on file   Number of children: Not on file   Years of education: Not on file   Highest education level: Not on file  Occupational History   Not on file  Tobacco Use   Smoking status: Every Day    Packs/day: 1.00    Years: 15.00    Pack years: 15.00    Types: Cigarettes   Smokeless tobacco:  Never  Substance and Sexual Activity   Alcohol use: Not on file   Drug use: Not on file   Sexual activity: Not on file  Other Topics Concern   Not on file  Social History Narrative   Not on file   Social Determinants of Health   Financial Resource Strain: Low Risk    Difficulty of Paying Living Expenses: Not very hard  Food Insecurity: No Food Insecurity   Worried About Running Out of  Food in the Last Year: Never true   Ran Out of Food in the Last Year: Never true  Transportation Needs: No Transportation Needs   Lack of Transportation (Medical): No   Lack of Transportation (Non-Medical): No  Physical Activity: Not on file  Stress: Not on file  Social Connections: Not on file  Intimate Partner Violence: Not At Risk   Fear of Current or Ex-Partner: No   Emotionally Abused: No   Physically Abused: No   Sexually Abused: No    FAMILY HISTORY: History reviewed. No pertinent family history.  ALLERGIES:  is allergic to atorvastatin.  MEDICATIONS:  Current Outpatient Medications  Medication Sig Dispense Refill   albuterol (VENTOLIN HFA) 108 (90 Base) MCG/ACT inhaler Inhale 1-2 puffs into the lungs every 6 (six) hours as needed for wheezing or shortness of breath.     amLODipine (NORVASC) 5 MG tablet Take 5 mg by mouth daily.     aspirin 81 MG chewable tablet Chew 81 mg by mouth daily.     buPROPion (WELLBUTRIN SR) 150 MG 12 hr tablet Start one week before quit date. Take 1 tab daily x 3 days, then 1 tab BID thereafter. (Patient taking differently: Take 150 mg by mouth daily.) 60 tablet 2   calcium citrate-vitamin D (CITRACAL+D) 315-200 MG-UNIT tablet Take 1 tablet by mouth 2 (two) times daily.     carvedilol (COREG) 6.25 MG tablet Take 6.25 mg by mouth 2 (two) times daily with a meal.     Cholecalciferol (VITAMIN D3) 125 MCG (5000 UT) CAPS Take 5,000 Units by mouth daily.     co-enzyme Q-10 30 MG capsule Take 30 mg by mouth daily.     cyclobenzaprine (FLEXERIL) 10 MG tablet Take 10 mg by mouth 3 (three) times daily as needed for muscle spasms.     dexamethasone (DECADRON) 4 MG tablet Take 2 tablets (8 mg total) by mouth daily. Take daily x 3 days starting the day after cisplatin chemotherapy. Take with food. 30 tablet 1   diphenoxylate-atropine (LOMOTIL) 2.5-0.025 MG tablet Take 1 tablet by mouth 4 (four) times daily as needed for diarrhea or loose stools. 30 tablet 0    Evolocumab (REPATHA SURECLICK) 706 MG/ML SOAJ Inject 140 mg into the skin every 14 (fourteen) days.     Flaxseed, Linseed, (FLAXSEED OIL) 1000 MG CAPS Take 2,000 mg by mouth daily.     lidocaine (XYLOCAINE) 2 % solution Patient: Mix 1part 2% viscous lidocaine, 1part H20. Swish & swallow 28mL of diluted mixture, 72min before meals and at bedtime, up to QID 200 mL 3   lidocaine-prilocaine (EMLA) cream Apply to affected area once 30 g 3   lisinopril (ZESTRIL) 20 MG tablet Take 1 tablet by mouth daily.     Multiple Vitamins-Minerals (MULTIVITAMIN WOMEN 50+ PO) Take 1 tablet by mouth daily.     nicotine (NICODERM CQ - DOSED IN MG/24 HOURS) 21 mg/24hr patch Place 21 mg onto the skin daily.     Nutritional Supplements (FEEDING SUPPLEMENT, OSMOLITE 1.5  CAL,) LIQD Osmolite 1.5 x 6 cartons/day - Give 1 1/2 cartons via PEG QID. Flush tube with 60 ml water before and after each feeding. Drink by mouth or give via tube additional 720 ml (3c.) water daily. 1422 mL 0   omeprazole (PRILOSEC) 20 MG capsule Take 20 mg by mouth daily.     ondansetron (ZOFRAN) 8 MG tablet Take 1 tablet (8 mg total) by mouth 2 (two) times daily as needed. Start on the third day after cisplatin chemotherapy. 30 tablet 1   Oxycodone HCl 10 MG TABS Take 1 tablet (10 mg total) by mouth every 8 (eight) hours as needed (cancer pain). 40 tablet 0   potassium chloride (KLOR-CON) 10 MEQ tablet Take 10 mEq by mouth daily.     predniSONE (DELTASONE) 20 MG tablet Take 20 mg by mouth daily as needed (COPD).     prochlorperazine (COMPAZINE) 10 MG tablet Take 1 tablet (10 mg total) by mouth every 6 (six) hours as needed (Nausea or vomiting). 30 tablet 1   sharps container Use as directed for sharps disposal     SPIRIVA RESPIMAT 2.5 MCG/ACT AERS Inhale 2 puffs into the lungs daily.     sucralfate (CARAFATE) 1 GM/10ML suspension Take 10 mLs (1 g total) by mouth 4 (four) times daily -  with meals and at bedtime. 420 mL 2   vitamin B-12 (CYANOCOBALAMIN)  500 MCG tablet Take 500 mcg by mouth daily.     No current facility-administered medications for this visit.    PHYSICAL EXAMINATION:  ECOG PERFORMANCE STATUS: 1 - Symptomatic but completely ambulatory  Vitals:   08/31/20 0923  BP: 107/63  Pulse: (!) 110  Resp: 17  Temp: 97.7 F (36.5 C)  SpO2: 97%    Filed Weights   08/31/20 0923  Weight: 152 lb 3.2 oz (69 kg)    Physical Exam Constitutional:      Appearance: Normal appearance.  HENT:     Head: Normocephalic and atraumatic.     Mouth/Throat:     Pharynx: Oropharyngeal exudate and posterior oropharyngeal erythema present.  Cardiovascular:     Rate and Rhythm: Normal rate and regular rhythm.     Pulses: Normal pulses.     Heart sounds: Normal heart sounds.  Pulmonary:     Effort: Pulmonary effort is normal.     Breath sounds: Normal breath sounds.  Abdominal:     General: Abdomen is flat. Bowel sounds are normal.     Palpations: Abdomen is soft.  Musculoskeletal:     Cervical back: Normal range of motion and neck supple.  Lymphadenopathy:     Cervical: No cervical adenopathy.  Skin:    General: Skin is warm and dry.  Neurological:     General: No focal deficit present.     Mental Status: She is alert.  Psychiatric:        Mood and Affect: Mood normal.   LABORATORY DATA:    Lab Results  Component Value Date   WBC 3.8 (L) 08/31/2020   HGB 11.6 (L) 08/31/2020   HCT 33.2 (L) 08/31/2020   MCV 92.0 08/31/2020   PLT 106 (L) 08/31/2020     Chemistry      Component Value Date/Time   NA 135 08/31/2020 0856   K 4.2 08/31/2020 0856   CL 100 08/31/2020 0856   CO2 27 08/31/2020 0856   BUN 17 08/31/2020 0856   CREATININE 1.07 (H) 08/31/2020 0856      Component Value Date/Time  CALCIUM 8.8 (L) 08/31/2020 0856   ALKPHOS 55 07/13/2020 1239   AST 14 (L) 07/13/2020 1239   ALT 8 07/13/2020 1239   BILITOT 0.3 07/13/2020 1239       In situ study for HPV 16/18 is negative. Slides will ne sent to [facility]  for a broader HR HPV panel  Addendum electronically signed by Isac Caddy Askin, MD on 06/18/2020 at  5:04 PM  Addendum    The p16 immunostain is positive. HR HPV study ordered.  Addendum electronically signed by Isac Caddy Askin, MD on 06/18/2020 at  1:57 PM  Diagnosis    A: Throat, right, biopsy - Fragments of keratinizing well differentiated squamous carcinoma.  A single focus of submucosa with invasive carcinoma is present   Addendum 3    Noted 07/01/2000: Tissue was sent to [facility] for a broad HR HPV ISH study.  Results were: Negative.  The controls were appropriate      RADIOGRAPHIC STUDIES: I have personally reviewed the radiological images as listed and agreed with the findings in the report. CT Abdomen Pelvis Wo Contrast  Addendum Date: 08/07/2020   ADDENDUM REPORT: 08/07/2020 23:13 ADDENDUM: No fat stranding surrounding the gastric lumen. No organized fluid collection. No gastric wall thickening or pneumatosis. Gas along the tract is nonspecific. Pexied g-tube with T-bar as well as inflated intra-luminal balloon noted. If further concern, recommend CT with intravenous contrast and PO contrast for further evaluation. These results were called by telephone at the time of interpretation on 08/07/2020 at 11:11 pm to provider [facility] , who verbally acknowledged these results. Electronically Signed   By: Iven Finn M.D.   On: 08/07/2020 23:13   Result Date: 08/07/2020 CLINICAL DATA:  Abdominal distension progressive abdominal pain. Cancer tonsils/oropharynx. having feeding tube placed about 10 days ago. She endorses pain at the site x3-4 days, and then noticed bleeding in the tube today. EXAM: CT ABDOMEN AND PELVIS WITHOUT CONTRAST TECHNIQUE: Multidetector CT imaging of the abdomen and pelvis was performed following the standard protocol without IV contrast. COMPARISON:  None. FINDINGS: Lower chest: Bilateral lower lobe subsegmental atelectasis. Mitral annular  calcifications. Coronary artery calcifications. No acute abnormality Hepatobiliary: No focal liver abnormality. No gallstones, gallbladder wall thickening, or pericholecystic fluid. No biliary dilatation. Pancreas: No focal lesion. Normal pancreatic contour. No surrounding inflammatory changes. No main pancreatic ductal dilatation. Spleen: Normal in size without focal abnormality. Adrenals/Urinary Tract: No adrenal nodule bilaterally. No nephrolithiasis, no hydronephrosis. There is a 2.4 x 2.1 cm lesion within the left kidney with a density of 38 Hounsfield units. No ureterolithiasis or hydroureter. The urinary bladder is distended with urine. Stomach/Bowel: Gastrostomy tube within the gastric lumen with however very thin wall around the balloon anteriorly. Question erosion of the gastric wall. Markedly limited evaluation on this noncontrast study. Otherwise the small and large bowel demonstrates no bowel thickening or dilatation. Diffuse sigmoid diverticulosis. No pneumatosis. The appendix is unremarkable. Vascular/Lymphatic: Aortobifem bypass. No abdominal aorta or iliac aneurysm. Severe atherosclerotic plaque of the aorta and its branches. No abdominal, pelvic, or inguinal lymphadenopathy. Reproductive: Atrophic uterus.  Bilateral adnexa are unremarkable. Other: No intraperitoneal free fluid. No intraperitoneal free gas. No organized fluid collection. Musculoskeletal: Couple of tiny fat containing supraumbilical ventral hernias (6: 86-92). No suspicious lytic or blastic osseous lesions. No acute displaced fracture. Multilevel degenerative changes of the spine. Chronic anterior anterior wedge deformity of the L2 level. IMPRESSION: 1. Gastrostomy tube within the gastric lumen with however very thin wall around the balloon anteriorly.  Question erosion of the gastric wall. Markedly limited evaluation on this noncontrast study (IV and PO contrast recommended). 2. Indeterminate 2.4 cm left renal lesion. Malignancy not  excluded. Recommend MRI renal protocol for further evaluation. 3. Urinary bladder distended with urine. 4. Diffuse sigmoid diverticulosis with no acute diverticulitis. 5. Couple of tiny fat containing supraumbilical ventral hernias 6. Aortic Atherosclerosis (ICD10-I70.0) - severe status post aortobifem bypass with markedly limited evaluation on this noncontrast study. Electronically Signed: By: Iven Finn M.D. On: 08/07/2020 22:43   IR Cm Inj Any Colonic Tube W/Fluoro  Result Date: 08/10/2020 INDICATION: 70 year old woman with history of oro pharyngeal malignancy status post 1 French G-tube placement on 07/28/2020 returns with continued pain at insertion site. EXAM: Fluoroscopic gastrostomy tube evaluation. MEDICATIONS: None ANESTHESIA/SEDATION: None CONTRAST:  20 mL Omnipaque 300-administered into the gastric lumen. FLUOROSCOPY TIME:  Fluoroscopy Time: 1 minutes 6 seconds (26 mGy). COMPLICATIONS: None immediate. PROCEDURE: Informed written consent was obtained from the patient after a thorough discussion of the procedural risks, benefits and alternatives. Patient positioned supine on the angiography table. Scout image demonstrated gastrostomy tube in appropriate position. Contrast administered through the tube under fluoroscopy confirmed appropriate positioning within the gastric lumen. The balloon was advanced easily into the gastric lumen confirming that it was not inflated within the wall of the stomach. The balloon was deflated while in the gastric fundus and reinflated with normal saline and retracted to the anterior abdominal wall. IMPRESSION: Fluoroscopic evaluation of gastrostomy tube demonstrates appropriate positioning of the catheter tip and balloon. Electronically Signed   By: Miachel Roux M.D.   On: 08/10/2020 08:55       Benay Pike, MD 08/31/2020 9:51 AM

## 2020-08-31 NOTE — Assessment & Plan Note (Addendum)
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 She is here for cycle 5 of planned chemotherapy.  She is tolerating it well except for fatigue, ongoing mucositis.  She has maintained her weight since last visit, currently using G-tube feedings. No dose-limiting toxicity noted.  No hearing loss, neuropathy or cytopenias that would limit the use of chemotherapy. Okay to proceed with chemotherapy tomorrow as planned if labs are within parameters.

## 2020-08-31 NOTE — Assessment & Plan Note (Signed)
Patient is currently using oxycodone 2 tablets a day for management of mucositis related to antineoplastic therapy.  Okay to continue this.  We have discussed about liquid hydrocodone versus oxycodone.  She would like to continue current pain management since she has been using it for a long time.

## 2020-08-31 NOTE — Assessment & Plan Note (Signed)
Continue use of viscous lidocaine and as needed oxycodone.  G-tube feedings given severe mucositis that limit her from swallowing.

## 2020-09-01 ENCOUNTER — Inpatient Hospital Stay: Payer: Medicare Other | Admitting: Nutrition

## 2020-09-01 ENCOUNTER — Ambulatory Visit
Admission: RE | Admit: 2020-09-01 | Discharge: 2020-09-01 | Disposition: A | Discharge status: Home / Self Care | Payer: Medicare Other | Source: Ambulatory Visit | Attending: Radiation Oncology | Admitting: Radiation Oncology

## 2020-09-01 ENCOUNTER — Encounter: Payer: Self-pay | Admitting: Hematology and Oncology

## 2020-09-01 ENCOUNTER — Inpatient Hospital Stay: Payer: Medicare Other

## 2020-09-01 VITALS — BP 113/63 | HR 98 | Temp 97.8°F | Resp 20 | Wt 151.8 lb

## 2020-09-01 DIAGNOSIS — C090 Malignant neoplasm of tonsillar fossa: Secondary | ICD-10-CM | POA: Diagnosis not present

## 2020-09-01 DIAGNOSIS — Z5111 Encounter for antineoplastic chemotherapy: Secondary | ICD-10-CM | POA: Diagnosis not present

## 2020-09-01 DIAGNOSIS — C109 Malignant neoplasm of oropharynx, unspecified: Secondary | ICD-10-CM

## 2020-09-01 MED ORDER — HEPARIN SOD (PORK) LOCK FLUSH 100 UNIT/ML IV SOLN
500.0000 [IU] | Freq: Once | INTRAVENOUS | Status: AC | PRN
  Administered 2020-09-01: 500 [IU]
  Filled 2020-09-01: qty 5

## 2020-09-01 MED ORDER — SODIUM CHLORIDE 0.9 % IV SOLN
150.0000 mg | Freq: Once | INTRAVENOUS | Status: AC
  Administered 2020-09-01: 150 mg via INTRAVENOUS
  Filled 2020-09-01: qty 150

## 2020-09-01 MED ORDER — SODIUM CHLORIDE 0.9 % IV SOLN
40.0000 mg/m2 | Freq: Once | INTRAVENOUS | Status: AC
  Administered 2020-09-01: 74 mg via INTRAVENOUS
  Filled 2020-09-01: qty 74

## 2020-09-01 MED ORDER — POTASSIUM CHLORIDE IN NACL 20-0.9 MEQ/L-% IV SOLN
Freq: Once | INTRAVENOUS | Status: AC
Start: 2020-09-01 — End: 2020-09-01
  Filled 2020-09-01: qty 1000

## 2020-09-01 MED ORDER — DEXAMETHASONE SODIUM PHOSPHATE 100 MG/10ML IJ SOLN
10.0000 mg | Freq: Once | INTRAMUSCULAR | Status: AC
Start: 2020-09-01 — End: 2020-09-01
  Administered 2020-09-01: 10 mg via INTRAVENOUS
  Filled 2020-09-01: qty 10

## 2020-09-01 MED ORDER — SODIUM CHLORIDE 0.9 % IV SOLN
Freq: Once | INTRAVENOUS | Status: AC
  Filled 2020-09-01: qty 250

## 2020-09-01 MED ORDER — PALONOSETRON HCL INJECTION 0.25 MG/5ML
INTRAVENOUS | Status: AC
  Filled 2020-09-01: qty 5

## 2020-09-01 MED ORDER — MAGNESIUM SULFATE 2 GM/50ML IV SOLN
2.0000 g | Freq: Once | INTRAVENOUS | Status: AC
  Administered 2020-09-01: 2 g via INTRAVENOUS

## 2020-09-01 MED ORDER — MAGNESIUM SULFATE 2 GM/50ML IV SOLN
INTRAVENOUS | Status: AC
  Filled 2020-09-01: qty 50

## 2020-09-01 MED ORDER — SODIUM CHLORIDE 0.9% FLUSH
10.0000 mL | INTRAVENOUS | Status: DC | PRN
  Administered 2020-09-01: 10 mL
  Filled 2020-09-01: qty 10

## 2020-09-01 MED ORDER — PALONOSETRON HCL INJECTION 0.25 MG/5ML
0.2500 mg | Freq: Once | INTRAVENOUS | Status: AC
  Administered 2020-09-01: 0.25 mg via INTRAVENOUS

## 2020-09-01 NOTE — Patient Instructions (Signed)
[facility] MEDICAL ONCOLOGY  Discharge Instructions: Thank you for choosing [facility] to provide your oncology and hematology care.   If you have a lab appointment with the Cancer Center, please go directly to the Cancer Center and check in at the registration area.   Wear comfortable clothing and clothing appropriate for easy access to any Portacath or PICC line.   We strive to give you quality time with your provider. You may need to reschedule your appointment if you arrive late (15 or more minutes).  Arriving late affects you and other patients whose appointments are after yours.  Also, if you miss three or more appointments without notifying the office, you may be dismissed from the clinic at the provider's discretion.      For prescription refill requests, have your pharmacy contact our office and allow 72 hours for refills to be completed.    Today you received the following chemotherapy and/or immunotherapy agents : Cisplatin    To help prevent nausea and vomiting after your treatment, we encourage you to take your nausea medication as directed.  BELOW ARE SYMPTOMS THAT SHOULD BE REPORTED IMMEDIATELY: *FEVER GREATER THAN 100.4 F (38 C) OR HIGHER *CHILLS OR SWEATING *NAUSEA AND VOMITING THAT IS NOT CONTROLLED WITH YOUR NAUSEA MEDICATION *UNUSUAL SHORTNESS OF BREATH *UNUSUAL BRUISING OR BLEEDING *URINARY PROBLEMS (pain or burning when urinating, or frequent urination) *BOWEL PROBLEMS (unusual diarrhea, constipation, pain near the anus) TENDERNESS IN MOUTH AND THROAT WITH OR WITHOUT PRESENCE OF ULCERS (sore throat, sores in mouth, or a toothache) UNUSUAL RASH, SWELLING OR PAIN  UNUSUAL VAGINAL DISCHARGE OR ITCHING   Items with * indicate a potential emergency and should be followed up as soon as possible or go to the Emergency Department if any problems should occur.  Please show the CHEMOTHERAPY ALERT CARD or IMMUNOTHERAPY ALERT CARD at check-in to  the Emergency Department and triage nurse.  Should you have questions after your visit or need to cancel or reschedule your appointment, please contact [facility] MEDICAL ONCOLOG[facility]  Dept: [phone]  and follow the prompts.  Office hours are 8:00 a.m. to 4:30 p.m. Monday - Friday. Please note that voicemails left after 4:00 p.m. may not be returned until the following business day.  We are closed weekends and major holidays. You have access to a nurse at all times for urgent questions. Please call the main number to the clinic Dept: [phone] and follow the prompts.   For any non-urgent questions, you may also contact your provider using MyChart. We now offer e-Visits for anyone 18 and older to request care online for non-urgent symptoms. For details visit [email or web address].   Also download the MyChart app! Go to the app store, search "MyChart", open the app, select [facility], and log in with your MyChart username and password.  Due to Covid, a mask is required upon entering the hospital/clinic. If you do not have a mask, one will be given to you upon arrival. For doctor visits, patients may have 1 support person aged 18 or older with them. For treatment visits, patients cannot have anyone with them due to current Covid guidelines and our immunocompromised population.   

## 2020-09-01 NOTE — Progress Notes (Signed)
Nutrition follow-up completed with patient during infusion for cancer of the oropharynx.  She continues to receive concurrent chemoradiation therapy using cisplatin and is status post PEG on June 1. Weight decreased to 151.6 pounds July 5 down from 154 pounds June 28 and 158.6 pounds June 21. Patient reports increased fatigue and sore throat. She has thickened saliva and dry mouth. Noted labs: Glucose 164, creatinine 1.07, magnesium 1.7. Patient tolerating 6 cartons Osmolite 1.5 daily with 60 mL free water before and after bolus feedings. She admits she has forgotten to add additional 240 mL of free water 3 times daily between feedings.  She is agreeable to adding free water as suggested. Tube feeding and free water flushes provide 2130 cal, 89.4 g protein, 2286 mL free water.  Monitoring, evaluation, goals: Patient will tolerate tube feeding and free water to meet greater than 90% estimated calorie needs for weight maintenance.  Next visit: Tuesday, July 12 during infusion.  **Disclaimer: This note was dictated with voice recognition software. Similar sounding words can inadvertently be transcribed and this note may contain transcription errors which may not have been corrected upon publication of note.**

## 2020-09-01 NOTE — Progress Notes (Signed)
Continue Cisplatin 40mg /m2 per MD.  Orders updated as requested. Future orders updated as well.  Raul Del [place], [place], BCPS, BCOP 09/01/2020 11:22 AM

## 2020-09-01 NOTE — Addendum Note (Signed)
Addended by: Adaline Sill on: 09/01/2020 09:28 AM   Modules accepted: Orders

## 2020-09-02 ENCOUNTER — Ambulatory Visit
Admission: RE | Admit: 2020-09-02 | Discharge: 2020-09-02 | Disposition: A | Discharge status: Home / Self Care | Payer: Medicare Other | Source: Ambulatory Visit | Attending: Radiation Oncology | Admitting: Radiation Oncology

## 2020-09-02 DIAGNOSIS — C090 Malignant neoplasm of tonsillar fossa: Secondary | ICD-10-CM | POA: Diagnosis not present

## 2020-09-03 ENCOUNTER — Ambulatory Visit
Admission: RE | Admit: 2020-09-03 | Discharge: 2020-09-03 | Disposition: A | Discharge status: Home / Self Care | Payer: Medicare Other | Source: Ambulatory Visit | Attending: Radiation Oncology | Admitting: Radiation Oncology

## 2020-09-03 DIAGNOSIS — C090 Malignant neoplasm of tonsillar fossa: Secondary | ICD-10-CM | POA: Diagnosis not present

## 2020-09-06 ENCOUNTER — Inpatient Hospital Stay: Payer: Medicare Other

## 2020-09-06 ENCOUNTER — Encounter: Payer: Self-pay | Admitting: Hematology and Oncology

## 2020-09-06 ENCOUNTER — Ambulatory Visit
Admission: RE | Admit: 2020-09-06 | Discharge: 2020-09-06 | Disposition: A | Discharge status: Home / Self Care | Payer: Medicare Other | Source: Ambulatory Visit | Attending: Radiation Oncology | Admitting: Radiation Oncology

## 2020-09-06 ENCOUNTER — Inpatient Hospital Stay (HOSPITAL_BASED_OUTPATIENT_CLINIC_OR_DEPARTMENT_OTHER): Payer: Medicare Other | Admitting: Hematology and Oncology

## 2020-09-06 ENCOUNTER — Other Ambulatory Visit: Payer: Self-pay

## 2020-09-06 DIAGNOSIS — E860 Dehydration: Secondary | ICD-10-CM

## 2020-09-06 DIAGNOSIS — K1231 Oral mucositis (ulcerative) due to antineoplastic therapy: Secondary | ICD-10-CM | POA: Diagnosis not present

## 2020-09-06 DIAGNOSIS — Z931 Gastrostomy status: Secondary | ICD-10-CM

## 2020-09-06 DIAGNOSIS — C090 Malignant neoplasm of tonsillar fossa: Secondary | ICD-10-CM

## 2020-09-06 DIAGNOSIS — C109 Malignant neoplasm of oropharynx, unspecified: Secondary | ICD-10-CM

## 2020-09-06 DIAGNOSIS — Z95828 Presence of other vascular implants and grafts: Secondary | ICD-10-CM

## 2020-09-06 DIAGNOSIS — Z5111 Encounter for antineoplastic chemotherapy: Secondary | ICD-10-CM | POA: Diagnosis not present

## 2020-09-06 LAB — BASIC METABOLIC PANEL - CANCER CENTER ONLY
Anion gap: 9 (ref 5–15)
BUN: 15 mg/dL (ref 8–23)
CO2: 27 mmol/L (ref 22–32)
Calcium: 9.1 mg/dL (ref 8.9–10.3)
Chloride: 96 mmol/L — ABNORMAL LOW (ref 98–111)
Creatinine: 1.01 mg/dL — ABNORMAL HIGH (ref 0.44–1.00)
GFR, Estimated: >60 mL/min (ref >60)
Glucose, Bld: 106 mg/dL — ABNORMAL HIGH (ref 70–99)
Potassium: 4.6 mmol/L (ref 3.5–5.1)
Sodium: 132 mmol/L — ABNORMAL LOW (ref 135–145)

## 2020-09-06 LAB — CBC WITH DIFFERENTIAL (CANCER CENTER ONLY)
Abs Immature Granulocytes: 0.01 10*3/uL (ref 0.00–0.07)
Basophils Absolute: 0 10*3/uL (ref 0.0–0.1)
Basophils Relative: 0 %
Eosinophils Absolute: 0 10*3/uL (ref 0.0–0.5)
Eosinophils Relative: 0 %
HCT: 33.2 % — ABNORMAL LOW (ref 36.0–46.0)
Hemoglobin: 11.6 g/dL — ABNORMAL LOW (ref 12.0–15.0)
Immature Granulocytes: 1 %
Lymphocytes Relative: 13 %
Lymphs Abs: 0.2 10*3/uL — ABNORMAL LOW (ref 0.7–4.0)
MCH: 32.2 pg (ref 26.0–34.0)
MCHC: 34.9 g/dL (ref 30.0–36.0)
MCV: 92.2 fL (ref 80.0–100.0)
Monocytes Absolute: 0.1 10*3/uL (ref 0.1–1.0)
Monocytes Relative: 8 %
Neutro Abs: 1.2 10*3/uL — ABNORMAL LOW (ref 1.7–7.7)
Neutrophils Relative %: 78 %
Platelet Count: 139 10*3/uL — ABNORMAL LOW (ref 150–400)
RBC: 3.6 MIL/uL — ABNORMAL LOW (ref 3.87–5.11)
RDW: 13.2 % (ref 11.5–15.5)
WBC Count: 1.5 10*3/uL — ABNORMAL LOW (ref 4.0–10.5)
nRBC: 0 % (ref 0.0–0.2)

## 2020-09-06 LAB — MAGNESIUM: Magnesium: 1.8 mg/dL (ref 1.7–2.4)

## 2020-09-06 MED ORDER — SODIUM CHLORIDE 0.9 % IV SOLN
Freq: Once | INTRAVENOUS | Status: AC
Start: 2020-09-13 — End: 2020-09-06
  Filled 2020-09-06: qty 250

## 2020-09-06 MED ORDER — SODIUM CHLORIDE 0.9% FLUSH
10.0000 mL | INTRAVENOUS | Status: AC | PRN
Start: 2020-09-06 — End: 2020-09-06
  Administered 2020-09-06: 10 mL
  Filled 2020-09-06: qty 10

## 2020-09-06 NOTE — Patient Instructions (Signed)

## 2020-09-06 NOTE — Assessment & Plan Note (Signed)
Stable weight on current G tube feedings Will continue to monitor.

## 2020-09-06 NOTE — Progress Notes (Signed)
Per Dr Iruku, ok to treat with ANC 1.2 

## 2020-09-06 NOTE — Assessment & Plan Note (Signed)
On oxycodone BID,  This has been managing pain well. No need for refill today

## 2020-09-06 NOTE — Assessment & Plan Note (Signed)
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 She is here for cycle 6 of planned chemotherapy.  She is tolerating it well except for fatigue, ongoing mucositis.  She has maintained her weight since last visit, currently using G-tube feedings. Ongoing tinnitus without hearing loss. NO neuropathy Will proceed with planned C6 of cisplatin and follow.

## 2020-09-06 NOTE — Progress Notes (Signed)
[facility] CONSULT NOTE  Patient Care Team: Alphia Moh, MD as PCP - General (Family Medicine) Malmfelt, Stephani Police, RN as Oncology Nurse Navigator Charolett Bumpers, Mike Craze, MD as Referring Physician (Otolaryngology) Eppie Gibson, MD as Consulting Physician (Radiation Oncology) Benay Pike, MD as Consulting Physician (Hematology and Oncology)  CHIEF COMPLAINTS/PURPOSE OF CONSULTATION:  Tonsillar cancer follow up  ASSESSMENT & PLAN:   Cancer of tonsillar fossa [facility]) This is a very pleasant 70 year old female patient with a newly diagnosed squamous cell carcinoma of the oropharynx, T2N2cM0, 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 She is here for cycle 6 of planned chemotherapy.  She is tolerating it well except for fatigue, ongoing mucositis.  She has maintained her weight since last visit, currently using G-tube feedings. Ongoing tinnitus without hearing loss. NO neuropathy Will proceed with planned C6 of cisplatin and follow.  Mucositis due to antineoplastic therapy On oxycodone BID,  This has been managing pain well. No need for refill today  G tube feedings (HCC) Stable weight on current G tube feedings Will continue to monitor.  No orders of the defined types were placed in this encounter.   HISTORY OF PRESENTING ILLNESS:   Cathy Knight 70 y.o. female is here because of SCC oropharynx, P 16 +  Chronology  This is a very pleasant 70 year old female patient with newly diagnosed oropharyngeal cancer referred to medical oncology for recommendations.  During her last visit we discussed about concurrent chemoradiation given bilateral disease.  Interval History Patient is here for follow-up prior to week 6 of cisplatin She feels more and more tired, needing nausea meds every 8 hrs, prefers compazine. Weight is stable, using 4-6 cartons of feeding every day Loose stool, no overt diarrhea Tinnitus continues, no hearing  loss Mild tingling and numbness of his hands. Pain well controlled with BID oxycodone. Rest of the pertinent 10 point ROS reviewed and neg.  MEDICAL HISTORY:  Past Medical History:  Diagnosis Date   Cancer ([place])    Hypertension     SURGICAL HISTORY: Past Surgical History:  Procedure Laterality Date   IR CM INJ ANY COLONIC TUBE W/FLUORO  08/09/2020   IR GASTROSTOMY TUBE MOD SED  07/28/2020   IR IMAGING GUIDED PORT INSERTION  07/28/2020    SOCIAL HISTORY: Social History   Socioeconomic History   Marital status: Single    Spouse name: Not on file   Number of children: Not on file   Years of education: Not on file   Highest education level: Not on file  Occupational History   Not on file  Tobacco Use   Smoking status: Every Day    Packs/day: 1.00    Years: 15.00    Pack years: 15.00    Types: Cigarettes   Smokeless tobacco: Never  Substance and Sexual Activity   Alcohol use: Not on file   Drug use: Not on file   Sexual activity: Not on file  Other Topics Concern   Not on file  Social History Narrative   Not on file   Social Determinants of Health   Financial Resource Strain: Low Risk    Difficulty of Paying Living Expenses: Not very hard  Food Insecurity: No Food Insecurity   Worried About Running Out of Food in the Last Year: Never true   Ran Out of Food in the Last Year: Never true  Transportation Needs: No Transportation Needs   Lack of Transportation (Medical): No   Lack of Transportation (Non-Medical):  No  Physical Activity: Not on file  Stress: Not on file  Social Connections: Not on file  Intimate Partner Violence: Not At Risk   Fear of Current or Ex-Partner: No   Emotionally Abused: No   Physically Abused: No   Sexually Abused: No    FAMILY HISTORY: No family history on file.  ALLERGIES:  is allergic to atorvastatin.  MEDICATIONS:  Current Outpatient Medications  Medication Sig Dispense Refill   albuterol (VENTOLIN HFA) 108 (90 Base) MCG/ACT  inhaler Inhale 1-2 puffs into the lungs every 6 (six) hours as needed for wheezing or shortness of breath.     amLODipine (NORVASC) 5 MG tablet Take 5 mg by mouth daily.     aspirin 81 MG chewable tablet Chew 81 mg by mouth daily.     buPROPion (WELLBUTRIN SR) 150 MG 12 hr tablet Start one week before quit date. Take 1 tab daily x 3 days, then 1 tab BID thereafter. (Patient taking differently: Take 150 mg by mouth daily.) 60 tablet 2   calcium citrate-vitamin D (CITRACAL+D) 315-200 MG-UNIT tablet Take 1 tablet by mouth 2 (two) times daily.     carvedilol (COREG) 6.25 MG tablet Take 6.25 mg by mouth 2 (two) times daily with a meal.     Cholecalciferol (VITAMIN D3) 125 MCG (5000 UT) CAPS Take 5,000 Units by mouth daily.     co-enzyme Q-10 30 MG capsule Take 30 mg by mouth daily.     cyclobenzaprine (FLEXERIL) 10 MG tablet Take 10 mg by mouth 3 (three) times daily as needed for muscle spasms.     dexamethasone (DECADRON) 4 MG tablet Take 2 tablets (8 mg total) by mouth daily. Take daily x 3 days starting the day after cisplatin chemotherapy. Take with food. 30 tablet 1   diphenoxylate-atropine (LOMOTIL) 2.5-0.025 MG tablet Take 1 tablet by mouth 4 (four) times daily as needed for diarrhea or loose stools. 30 tablet 0   Evolocumab (REPATHA SURECLICK) 329 MG/ML SOAJ Inject 140 mg into the skin every 14 (fourteen) days.     Flaxseed, Linseed, (FLAXSEED OIL) 1000 MG CAPS Take 2,000 mg by mouth daily.     lidocaine (XYLOCAINE) 2 % solution Patient: Mix 1part 2% viscous lidocaine, 1part H20. Swish & swallow 36mL of diluted mixture, 57min before meals and at bedtime, up to QID 200 mL 3   lidocaine-prilocaine (EMLA) cream Apply to affected area once 30 g 3   lisinopril (ZESTRIL) 20 MG tablet Take 1 tablet by mouth daily.     Multiple Vitamins-Minerals (MULTIVITAMIN WOMEN 50+ PO) Take 1 tablet by mouth daily.     nicotine (NICODERM CQ - DOSED IN MG/24 HOURS) 21 mg/24hr patch Place 21 mg onto the skin daily.      Nutritional Supplements (FEEDING SUPPLEMENT, OSMOLITE 1.5 CAL,) LIQD Osmolite 1.5 x 6 cartons/day - Give 1 1/2 cartons via PEG QID. Flush tube with 60 ml water before and after each feeding. Drink by mouth or give via tube additional 720 ml (3c.) water daily. 1422 mL 0   omeprazole (PRILOSEC) 20 MG capsule Take 20 mg by mouth daily.     ondansetron (ZOFRAN) 8 MG tablet Take 1 tablet (8 mg total) by mouth 2 (two) times daily as needed. Start on the third day after cisplatin chemotherapy. 30 tablet 1   Oxycodone HCl 10 MG TABS Take 1 tablet (10 mg total) by mouth every 8 (eight) hours as needed (cancer pain). 40 tablet 0   potassium chloride (KLOR-CON) 10  MEQ tablet Take 10 mEq by mouth daily.     predniSONE (DELTASONE) 20 MG tablet Take 20 mg by mouth daily as needed (COPD).     prochlorperazine (COMPAZINE) 10 MG tablet Take 1 tablet (10 mg total) by mouth every 6 (six) hours as needed (Nausea or vomiting). 30 tablet 1   sharps container Use as directed for sharps disposal     SPIRIVA RESPIMAT 2.5 MCG/ACT AERS Inhale 2 puffs into the lungs daily.     sucralfate (CARAFATE) 1 GM/10ML suspension Take 10 mLs (1 g total) by mouth 4 (four) times daily -  with meals and at bedtime. 420 mL 2   vitamin B-12 (CYANOCOBALAMIN) 500 MCG tablet Take 500 mcg by mouth daily.     No current facility-administered medications for this visit.   Facility-Administered Medications Ordered in Other Visits  Medication Dose Route Frequency Provider Last Rate Last Admin   [START ON 09/13/2020] 0.9 %  sodium chloride infusion   Intravenous Once Benay Pike, MD 999 mL/hr at 09/06/20 1028 Bolus from Bag at 09/06/20 1028    PHYSICAL EXAMINATION:  ECOG PERFORMANCE STATUS: 1 - Symptomatic but completely ambulatory  Vitals:   09/06/20 0901  BP: (!) 102/59  Pulse: 85  Resp: 18  Temp: 98.9 F (37.2 C)    Filed Weights   09/06/20 0901  Weight: 151 lb 1 oz (68.5 kg)    Physical Exam Constitutional:       Appearance: Normal appearance.  HENT:     Head: Normocephalic and atraumatic.     Mouth/Throat:     Pharynx: Oropharyngeal exudate and posterior oropharyngeal erythema present.  Cardiovascular:     Rate and Rhythm: Normal rate and regular rhythm.     Pulses: Normal pulses.     Heart sounds: Normal heart sounds.  Pulmonary:     Effort: Pulmonary effort is normal.     Breath sounds: Normal breath sounds.  Abdominal:     General: Abdomen is flat. Bowel sounds are normal.     Palpations: Abdomen is soft.  Musculoskeletal:     Cervical back: Normal range of motion and neck supple.  Lymphadenopathy:     Cervical: No cervical adenopathy.  Skin:    General: Skin is warm and dry.  Neurological:     General: No focal deficit present.     Mental Status: She is alert.  Psychiatric:        Mood and Affect: Mood normal.   LABORATORY DATA:    Lab Results  Component Value Date   WBC 1.5 (L) 09/06/2020   HGB 11.6 (L) 09/06/2020   HCT 33.2 (L) 09/06/2020   MCV 92.2 09/06/2020   PLT 139 (L) 09/06/2020     Chemistry      Component Value Date/Time   NA 132 (L) 09/06/2020 0830   K 4.6 09/06/2020 0830   CL 96 (L) 09/06/2020 0830   CO2 27 09/06/2020 0830   BUN 15 09/06/2020 0830   CREATININE 1.01 (H) 09/06/2020 0830      Component Value Date/Time   CALCIUM 9.1 09/06/2020 0830   ALKPHOS 55 07/13/2020 1239   AST 14 (L) 07/13/2020 1239   ALT 8 07/13/2020 1239   BILITOT 0.3 07/13/2020 1239       In situ study for HPV 16/18 is negative. Slides will ne sent to [facility] for a broader HR HPV panel  Addendum electronically signed by Isac Caddy Askin, MD on 06/18/2020 at  5:04 PM  Addendum  The p16 immunostain is positive. HR HPV study ordered.  Addendum electronically signed by Isac Caddy Askin, MD on 06/18/2020 at  1:57 PM  Diagnosis    A: Throat, right, biopsy - Fragments of keratinizing well differentiated squamous carcinoma.  A single focus of submucosa with invasive  carcinoma is present   Addendum 3    Noted 07/01/2000: Tissue was sent to [facility] for a broad HR HPV ISH study.  Results were: Negative.  The controls were appropriate      RADIOGRAPHIC STUDIES: I have personally reviewed the radiological images as listed and agreed with the findings in the report. CT Abdomen Pelvis Wo Contrast  Addendum Date: 08/07/2020   ADDENDUM REPORT: 08/07/2020 23:13 ADDENDUM: No fat stranding surrounding the gastric lumen. No organized fluid collection. No gastric wall thickening or pneumatosis. Gas along the tract is nonspecific. Pexied g-tube with T-bar as well as inflated intra-luminal balloon noted. If further concern, recommend CT with intravenous contrast and PO contrast for further evaluation. These results were called by telephone at the time of interpretation on 08/07/2020 at 11:11 pm to provider [facility] , who verbally acknowledged these results. Electronically Signed   By: Iven Finn M.D.   On: 08/07/2020 23:13   Result Date: 08/07/2020 CLINICAL DATA:  Abdominal distension progressive abdominal pain. Cancer tonsils/oropharynx. having feeding tube placed about 10 days ago. She endorses pain at the site x3-4 days, and then noticed bleeding in the tube today. EXAM: CT ABDOMEN AND PELVIS WITHOUT CONTRAST TECHNIQUE: Multidetector CT imaging of the abdomen and pelvis was performed following the standard protocol without IV contrast. COMPARISON:  None. FINDINGS: Lower chest: Bilateral lower lobe subsegmental atelectasis. Mitral annular calcifications. Coronary artery calcifications. No acute abnormality Hepatobiliary: No focal liver abnormality. No gallstones, gallbladder wall thickening, or pericholecystic fluid. No biliary dilatation. Pancreas: No focal lesion. Normal pancreatic contour. No surrounding inflammatory changes. No main pancreatic ductal dilatation. Spleen: Normal in size without focal abnormality. Adrenals/Urinary Tract: No adrenal nodule  bilaterally. No nephrolithiasis, no hydronephrosis. There is a 2.4 x 2.1 cm lesion within the left kidney with a density of 38 Hounsfield units. No ureterolithiasis or hydroureter. The urinary bladder is distended with urine. Stomach/Bowel: Gastrostomy tube within the gastric lumen with however very thin wall around the balloon anteriorly. Question erosion of the gastric wall. Markedly limited evaluation on this noncontrast study. Otherwise the small and large bowel demonstrates no bowel thickening or dilatation. Diffuse sigmoid diverticulosis. No pneumatosis. The appendix is unremarkable. Vascular/Lymphatic: Aortobifem bypass. No abdominal aorta or iliac aneurysm. Severe atherosclerotic plaque of the aorta and its branches. No abdominal, pelvic, or inguinal lymphadenopathy. Reproductive: Atrophic uterus.  Bilateral adnexa are unremarkable. Other: No intraperitoneal free fluid. No intraperitoneal free gas. No organized fluid collection. Musculoskeletal: Couple of tiny fat containing supraumbilical ventral hernias (6: 86-92). No suspicious lytic or blastic osseous lesions. No acute displaced fracture. Multilevel degenerative changes of the spine. Chronic anterior anterior wedge deformity of the L2 level. IMPRESSION: 1. Gastrostomy tube within the gastric lumen with however very thin wall around the balloon anteriorly. Question erosion of the gastric wall. Markedly limited evaluation on this noncontrast study (IV and PO contrast recommended). 2. Indeterminate 2.4 cm left renal lesion. Malignancy not excluded. Recommend MRI renal protocol for further evaluation. 3. Urinary bladder distended with urine. 4. Diffuse sigmoid diverticulosis with no acute diverticulitis. 5. Couple of tiny fat containing supraumbilical ventral hernias 6. Aortic Atherosclerosis (ICD10-I70.0) - severe status post aortobifem bypass with markedly limited evaluation on this noncontrast study.  Electronically Signed: By: Iven Finn M.D. On:  08/07/2020 22:43   IR Cm Inj Any Colonic Tube W/Fluoro  Result Date: 08/10/2020 INDICATION: 70 year old woman with history of oro pharyngeal malignancy status post 65 French G-tube placement on 07/28/2020 returns with continued pain at insertion site. EXAM: Fluoroscopic gastrostomy tube evaluation. MEDICATIONS: None ANESTHESIA/SEDATION: None CONTRAST:  20 mL Omnipaque 300-administered into the gastric lumen. FLUOROSCOPY TIME:  Fluoroscopy Time: 1 minutes 6 seconds (26 mGy). COMPLICATIONS: None immediate. PROCEDURE: Informed written consent was obtained from the patient after a thorough discussion of the procedural risks, benefits and alternatives. Patient positioned supine on the angiography table. Scout image demonstrated gastrostomy tube in appropriate position. Contrast administered through the tube under fluoroscopy confirmed appropriate positioning within the gastric lumen. The balloon was advanced easily into the gastric lumen confirming that it was not inflated within the wall of the stomach. The balloon was deflated while in the gastric fundus and reinflated with normal saline and retracted to the anterior abdominal wall. IMPRESSION: Fluoroscopic evaluation of gastrostomy tube demonstrates appropriate positioning of the catheter tip and balloon. Electronically Signed   By: Miachel Roux M.D.   On: 08/10/2020 08:55       Benay Pike, MD 09/06/2020 10:31 AM

## 2020-09-07 ENCOUNTER — Ambulatory Visit
Admission: RE | Admit: 2020-09-07 | Discharge: 2020-09-07 | Disposition: A | Discharge status: Home / Self Care | Payer: Medicare Other | Source: Ambulatory Visit | Attending: Radiation Oncology | Admitting: Radiation Oncology

## 2020-09-07 ENCOUNTER — Inpatient Hospital Stay: Payer: Medicare Other

## 2020-09-07 ENCOUNTER — Inpatient Hospital Stay: Payer: Medicare Other | Admitting: Nutrition

## 2020-09-07 VITALS — BP 116/73 | HR 80 | Temp 97.9°F | Resp 18

## 2020-09-07 DIAGNOSIS — Z5111 Encounter for antineoplastic chemotherapy: Secondary | ICD-10-CM | POA: Diagnosis not present

## 2020-09-07 DIAGNOSIS — C090 Malignant neoplasm of tonsillar fossa: Secondary | ICD-10-CM | POA: Diagnosis not present

## 2020-09-07 DIAGNOSIS — C109 Malignant neoplasm of oropharynx, unspecified: Secondary | ICD-10-CM

## 2020-09-07 MED ORDER — SODIUM CHLORIDE 0.9% FLUSH
10.0000 mL | INTRAVENOUS | Status: DC | PRN
  Administered 2020-09-07: 10 mL
  Filled 2020-09-07: qty 10

## 2020-09-07 MED ORDER — PALONOSETRON HCL INJECTION 0.25 MG/5ML
INTRAVENOUS | Status: AC
  Filled 2020-09-07: qty 5

## 2020-09-07 MED ORDER — SODIUM CHLORIDE 0.9 % IV SOLN
150.0000 mg | Freq: Once | INTRAVENOUS | Status: AC
  Administered 2020-09-07: 150 mg via INTRAVENOUS
  Filled 2020-09-07: qty 150

## 2020-09-07 MED ORDER — MAGNESIUM SULFATE 2 GM/50ML IV SOLN
INTRAVENOUS | Status: AC
  Filled 2020-09-07: qty 50

## 2020-09-07 MED ORDER — SODIUM CHLORIDE 0.9 % IV SOLN
Freq: Once | INTRAVENOUS | Status: AC
  Filled 2020-09-07: qty 250

## 2020-09-07 MED ORDER — POTASSIUM CHLORIDE IN NACL 20-0.9 MEQ/L-% IV SOLN
Freq: Once | INTRAVENOUS | Status: AC
Start: 2020-09-07 — End: 2020-09-07
  Filled 2020-09-07: qty 1000

## 2020-09-07 MED ORDER — PALONOSETRON HCL INJECTION 0.25 MG/5ML
0.2500 mg | Freq: Once | INTRAVENOUS | Status: AC
Start: 2020-09-07 — End: 2020-09-07
  Administered 2020-09-07: 0.25 mg via INTRAVENOUS

## 2020-09-07 MED ORDER — SODIUM CHLORIDE 0.9 % IV SOLN
40.0000 mg/m2 | Freq: Once | INTRAVENOUS | Status: AC
  Administered 2020-09-07: 74 mg via INTRAVENOUS
  Filled 2020-09-07: qty 74

## 2020-09-07 MED ORDER — HEPARIN SOD (PORK) LOCK FLUSH 100 UNIT/ML IV SOLN
500.0000 [IU] | Freq: Once | INTRAVENOUS | Status: AC | PRN
  Administered 2020-09-07: 500 [IU]
  Filled 2020-09-07: qty 5

## 2020-09-07 MED ORDER — MAGNESIUM SULFATE 2 GM/50ML IV SOLN
2.0000 g | Freq: Once | INTRAVENOUS | Status: AC
  Administered 2020-09-07: 2 g via INTRAVENOUS

## 2020-09-07 MED ORDER — SODIUM CHLORIDE 0.9 % IV SOLN
10.0000 mg | Freq: Once | INTRAVENOUS | Status: AC
  Administered 2020-09-07: 10 mg via INTRAVENOUS
  Filled 2020-09-07: qty 10

## 2020-09-07 NOTE — Progress Notes (Signed)
Nutrition follow-up completed with patient during infusion for cancer of the oropharynx.  She is receiving concurrent chemoradiation therapy using cisplatin and is status post PEG on June 1.  Weight stable and documented as 151.06 pounds on July 11. She has completed 25 out of 35 radiation therapy treatments. Noted labs: Sodium 132, glucose 106, creatinine 1.01.  Patient states she is only taking water by mouth.  Her throat is very sore.  She complains of thickened saliva. She reports increased gas/burping with Osmolite 1.5.  She is unsure of how much free water she is actually getting a day. Patient mentions she is not sure what she is going to do with all of the boost plus and Ensure Plus she has purchased.  She is refusing to drink these after treatment. Patient reports lower extremity edema primarily from ankles down and states calves are tender.  She states MD is aware.  Estimated nutrition needs: 2070-2215 cal, 110-125 g protein, 2.2 L fluid.  Nutrition diagnosis: Inadequate oral intake continues.  Intervention: Educated to try infusing tube feeding slowly and consistently so as not to introduce more air into the belly. Educated on strategies for infusing free water to decrease gas. Provided patient with option to use 2 ensure complete and 4 Osmolite 1.5 via PEG daily to provide some additional protein.  Estimated approximately 119 g protein daily.   Monitoring, evaluation, goals: Patient will tolerate tube feeding and free water to meet greater than 90% estimated needs for weight maintenance.  Next visit: Monday, July 18 during IV fluids.  **Disclaimer: This note was dictated with voice recognition software. Similar sounding words can inadvertently be transcribed and this note may contain transcription errors which may not have been corrected upon publication of note.**

## 2020-09-07 NOTE — Patient Instructions (Signed)
[facility]  Discharge Instructions: Thank you for choosing [facility] to provide your oncology and hematology care.   If you have a lab appointment with the Cancer Center, please go directly to the Cancer Center and check in at the registration area.   Wear comfortable clothing and clothing appropriate for easy access to any Portacath or PICC line.   We strive to give you quality time with your provider. You may need to reschedule your appointment if you arrive late (15 or more minutes).  Arriving late affects you and other patients whose appointments are after yours.  Also, if you miss three or more appointments without notifying the office, you may be dismissed from the clinic at the provider's discretion.      For prescription refill requests, have your pharmacy contact our office and allow 72 hours for refills to be completed.    Today you received the following chemotherapy and/or immunotherapy agents: Cisplatin     To help prevent nausea and vomiting after your treatment, we encourage you to take your nausea medication as directed.  BELOW ARE SYMPTOMS THAT SHOULD BE REPORTED IMMEDIATELY: *FEVER GREATER THAN 100.4 F (38 C) OR HIGHER *CHILLS OR SWEATING *NAUSEA AND VOMITING THAT IS NOT CONTROLLED WITH YOUR NAUSEA MEDICATION *UNUSUAL SHORTNESS OF BREATH *UNUSUAL BRUISING OR BLEEDING *URINARY PROBLEMS (pain or burning when urinating, or frequent urination) *BOWEL PROBLEMS (unusual diarrhea, constipation, pain near the anus) TENDERNESS IN MOUTH AND THROAT WITH OR WITHOUT PRESENCE OF ULCERS (sore throat, sores in mouth, or a toothache) UNUSUAL RASH, SWELLING OR PAIN  UNUSUAL VAGINAL DISCHARGE OR ITCHING   Items with * indicate a potential emergency and should be followed up as soon as possible or go to the Emergency Department if any problems should occur.  Please show the CHEMOTHERAPY ALERT CARD or IMMUNOTHERAPY ALERT CARD at check-in to  the Emergency Department and triage nurse.  Should you have questions after your visit or need to cancel or reschedule your appointment, please contact [facility]  Dept: [phone]  and follow the prompts.  Office hours are 8:00 a.m. to 4:30 p.m. Monday - Friday. Please note that voicemails left after 4:00 p.m. may not be returned until the following business day.  We are closed weekends and major holidays. You have access to a nurse at all times for urgent questions. Please call the main number to the clinic Dept: [phone] and follow the prompts.   For any non-urgent questions, you may also contact your provider using MyChart. We now offer e-Visits for anyone 18 and older to request care online for non-urgent symptoms. For details visit [email or web address].   Also download the MyChart app! Go to the app store, search "MyChart", open the app, select [facility], and log in with your MyChart username and password.  Due to Covid, a mask is required upon entering the hospital/clinic. If you do not have a mask, one will be given to you upon arrival. For doctor visits, patients may have 1 support person aged 18 or older with them. For treatment visits, patients cannot have anyone with them due to current Covid guidelines and our immunocompromised population.   Cisplatin injection What is this medication? CISPLATIN (SIS pla tin) is a chemotherapy drug. It targets fast dividing cells, like cancer cells, and causes these cells to die. This medicine is used totreat many types of cancer like bladder, ovarian, and testicular cancers. This medicine may be used for other   purposes; ask your health care provider orpharmacist if you have questions. COMMON BRAND NAME(S): Platinol, Platinol -AQ What should I tell my care team before I take this medication? They need to know if you have any of these conditions: eye disease, vision problems hearing problems kidney  disease low blood counts, like white cells, platelets, or red blood cells tingling of the fingers or toes, or other nerve disorder an unusual or allergic reaction to cisplatin, carboplatin, oxaliplatin, other medicines, foods, dyes, or preservatives pregnant or trying to get pregnant breast-feeding How should I use this medication? This drug is given as an infusion into a vein. It is administered in a hospitalor clinic by a specially trained health care professional. Talk to your pediatrician regarding the use of this medicine in children.Special care may be needed. Overdosage: If you think you have taken too much of this medicine contact apoison control center or emergency room at once. NOTE: This medicine is only for you. Do not share this medicine with others. What if I miss a dose? It is important not to miss a dose. Call your doctor or health careprofessional if you are unable to keep an appointment. What may interact with this medication? This medicine may interact with the following medications: foscarnet certain antibiotics like amikacin, gentamicin, neomycin, polymyxin B, streptomycin, tobramycin, vancomycin This list may not describe all possible interactions. Give your health care provider a list of all the medicines, herbs, non-prescription drugs, or dietary supplements you use. Also tell them if you smoke, drink alcohol, or use illegaldrugs. Some items may interact with your medicine. What should I watch for while using this medication? Your condition will be monitored carefully while you are receiving this medicine. You will need important blood work done while you are taking thismedicine. This drug may make you feel generally unwell. This is not uncommon, as chemotherapy can affect healthy cells as well as cancer cells. Report any side effects. Continue your course of treatment even though you feel ill unless yourdoctor tells you to stop. This medicine may increase your risk of  getting an infection. Call your healthcare professional for advice if you get a fever, chills, or sore throat, or other symptoms of a cold or flu. Do not treat yourself. Try to avoid beingaround people who are sick. Avoid taking medicines that contain aspirin, acetaminophen, ibuprofen, naproxen, or ketoprofen unless instructed by your healthcare professional.These medicines may hide a fever. This medicine may increase your risk to bruise or bleed. Call your doctor orhealth care professional if you notice any unusual bleeding. Be careful brushing and flossing your teeth or using a toothpick because you may get an infection or bleed more easily. If you have any dental work done,tell your dentist you are receiving this medicine. Do not become pregnant while taking this medicine or for 14 months after stopping it. Women should inform their healthcare professional if they wish to become pregnant or think they might be pregnant. Men should not father a child while taking this medicine and for 11 months after stopping it. There is potential for serious side effects to an unborn child. Talk to your healthcareprofessional for more information. Do not breast-feed an infant while taking this medicine. This medicine has caused ovarian failure in some women. This medicine may make it more difficult to get pregnant. Talk to your healthcare professional if youare concerned about your fertility. This medicine has caused decreased sperm counts in some men. This may make it more difficult to father a child.   Talk to your healthcare professional if youare concerned about your fertility. Drink fluids as directed while you are taking this medicine. This will helpprotect your kidneys. Call your doctor or health care professional if you get diarrhea. Do not treatyourself. What side effects may I notice from receiving this medication? Side effects that you should report to your doctor or health care professionalas soon as  possible: allergic reactions like skin rash, itching or hives, swelling of the face, lips, or tongue blurred vision changes in vision decreased hearing or ringing of the ears nausea, vomiting pain, redness, or irritation at site where injected pain, tingling, numbness in the hands or feet signs and symptoms of bleeding such as bloody or black, tarry stools; red or dark brown urine; spitting up blood or brown material that looks like coffee grounds; red spots on the skin; unusual bruising or bleeding from the eyes, gums, or nose signs and symptoms of infection like fever; chills; cough; sore throat; pain or trouble passing urine signs and symptoms of kidney injury like trouble passing urine or change in the amount of urine signs and symptoms of low red blood cells or anemia such as unusually weak or tired; feeling faint or lightheaded; falls; breathing problems Side effects that usually do not require medical attention (report to yourdoctor or health care professional if they continue or are bothersome): loss of appetite mouth sores muscle cramps This list may not describe all possible side effects. Call your doctor for medical advice about side effects. You may report side effects to FDA at1-800-FDA-1088. Where should I keep my medication? This drug is given in a hospital or clinic and will not be stored at home. NOTE: This sheet is a summary. It may not cover all possible information. If you have questions about this medicine, talk to your doctor, pharmacist, orhealth care provider.  2022 Elsevier/Gold Standard (2018-02-08 15:59:17)  

## 2020-09-08 ENCOUNTER — Ambulatory Visit
Admission: RE | Admit: 2020-09-08 | Discharge: 2020-09-08 | Disposition: A | Discharge status: Home / Self Care | Payer: Medicare Other | Source: Ambulatory Visit | Attending: Radiation Oncology | Admitting: Radiation Oncology

## 2020-09-08 ENCOUNTER — Other Ambulatory Visit: Payer: Self-pay

## 2020-09-08 DIAGNOSIS — C090 Malignant neoplasm of tonsillar fossa: Secondary | ICD-10-CM | POA: Diagnosis not present

## 2020-09-09 ENCOUNTER — Other Ambulatory Visit: Payer: Self-pay

## 2020-09-09 ENCOUNTER — Ambulatory Visit
Admission: RE | Admit: 2020-09-09 | Discharge: 2020-09-09 | Disposition: A | Discharge status: Home / Self Care | Payer: Medicare Other | Source: Ambulatory Visit | Attending: Radiation Oncology | Admitting: Radiation Oncology

## 2020-09-09 DIAGNOSIS — C090 Malignant neoplasm of tonsillar fossa: Secondary | ICD-10-CM | POA: Diagnosis not present

## 2020-09-10 ENCOUNTER — Ambulatory Visit
Admission: RE | Admit: 2020-09-10 | Discharge: 2020-09-10 | Disposition: A | Discharge status: Home / Self Care | Payer: Medicare Other | Source: Ambulatory Visit | Attending: Radiation Oncology | Admitting: Radiation Oncology

## 2020-09-10 ENCOUNTER — Other Ambulatory Visit: Payer: Self-pay

## 2020-09-10 ENCOUNTER — Inpatient Hospital Stay: Payer: Medicare Other

## 2020-09-10 VITALS — BP 129/77 | HR 87 | Temp 98.3°F | Resp 18 | Wt 149.4 lb

## 2020-09-10 DIAGNOSIS — C090 Malignant neoplasm of tonsillar fossa: Secondary | ICD-10-CM | POA: Diagnosis not present

## 2020-09-10 DIAGNOSIS — E860 Dehydration: Secondary | ICD-10-CM

## 2020-09-10 DIAGNOSIS — Z95828 Presence of other vascular implants and grafts: Secondary | ICD-10-CM

## 2020-09-10 DIAGNOSIS — Z5111 Encounter for antineoplastic chemotherapy: Secondary | ICD-10-CM | POA: Diagnosis not present

## 2020-09-10 MED ORDER — SODIUM CHLORIDE 0.9% FLUSH
10.0000 mL | Freq: Once | INTRAVENOUS | Status: AC
  Administered 2020-09-10: 10 mL
  Filled 2020-09-10: qty 10

## 2020-09-10 MED ORDER — SODIUM CHLORIDE 0.9 % IV SOLN
Freq: Once | INTRAVENOUS | Status: AC
  Filled 2020-09-10: qty 250

## 2020-09-10 MED ORDER — HEPARIN SOD (PORK) LOCK FLUSH 100 UNIT/ML IV SOLN
500.0000 [IU] | Freq: Once | INTRAVENOUS | Status: AC
  Administered 2020-09-10: 500 [IU]
  Filled 2020-09-10: qty 5

## 2020-09-13 ENCOUNTER — Inpatient Hospital Stay: Payer: Medicare Other | Admitting: Nutrition

## 2020-09-13 ENCOUNTER — Inpatient Hospital Stay: Payer: Medicare Other

## 2020-09-13 ENCOUNTER — Ambulatory Visit
Admission: RE | Admit: 2020-09-13 | Discharge: 2020-09-13 | Disposition: A | Discharge status: Home / Self Care | Payer: Medicare Other | Source: Ambulatory Visit | Attending: Radiation Oncology | Admitting: Radiation Oncology

## 2020-09-13 ENCOUNTER — Other Ambulatory Visit: Payer: Self-pay

## 2020-09-13 ENCOUNTER — Inpatient Hospital Stay (HOSPITAL_BASED_OUTPATIENT_CLINIC_OR_DEPARTMENT_OTHER): Payer: Medicare Other | Admitting: Hematology and Oncology

## 2020-09-13 ENCOUNTER — Encounter: Payer: Self-pay | Admitting: Hematology and Oncology

## 2020-09-13 DIAGNOSIS — Z5111 Encounter for antineoplastic chemotherapy: Secondary | ICD-10-CM | POA: Diagnosis not present

## 2020-09-13 DIAGNOSIS — Z95828 Presence of other vascular implants and grafts: Secondary | ICD-10-CM

## 2020-09-13 DIAGNOSIS — K1231 Oral mucositis (ulcerative) due to antineoplastic therapy: Secondary | ICD-10-CM

## 2020-09-13 DIAGNOSIS — C090 Malignant neoplasm of tonsillar fossa: Secondary | ICD-10-CM | POA: Diagnosis not present

## 2020-09-13 DIAGNOSIS — Z931 Gastrostomy status: Secondary | ICD-10-CM

## 2020-09-13 DIAGNOSIS — E860 Dehydration: Secondary | ICD-10-CM

## 2020-09-13 DIAGNOSIS — C109 Malignant neoplasm of oropharynx, unspecified: Secondary | ICD-10-CM | POA: Diagnosis not present

## 2020-09-13 DIAGNOSIS — T451X5A Adverse effect of antineoplastic and immunosuppressive drugs, initial encounter: Secondary | ICD-10-CM

## 2020-09-13 DIAGNOSIS — R110 Nausea: Secondary | ICD-10-CM | POA: Diagnosis not present

## 2020-09-13 LAB — CBC WITH DIFFERENTIAL (CANCER CENTER ONLY)
Abs Immature Granulocytes: 0.02 10*3/uL (ref 0.00–0.07)
Basophils Absolute: 0 10*3/uL (ref 0.0–0.1)
Basophils Relative: 0 %
Eosinophils Absolute: 0 10*3/uL (ref 0.0–0.5)
Eosinophils Relative: 0 %
HCT: 30.7 % — ABNORMAL LOW (ref 36.0–46.0)
Hemoglobin: 10.8 g/dL — ABNORMAL LOW (ref 12.0–15.0)
Immature Granulocytes: 1 %
Lymphocytes Relative: 11 %
Lymphs Abs: 0.3 10*3/uL — ABNORMAL LOW (ref 0.7–4.0)
MCH: 31.8 pg (ref 26.0–34.0)
MCHC: 35.2 g/dL (ref 30.0–36.0)
MCV: 90.3 fL (ref 80.0–100.0)
Monocytes Absolute: 0.4 10*3/uL (ref 0.1–1.0)
Monocytes Relative: 15 %
Neutro Abs: 1.8 10*3/uL (ref 1.7–7.7)
Neutrophils Relative %: 73 %
Platelet Count: 101 10*3/uL — ABNORMAL LOW (ref 150–400)
RBC: 3.4 MIL/uL — ABNORMAL LOW (ref 3.87–5.11)
RDW: 13.6 % (ref 11.5–15.5)
WBC Count: 2.5 10*3/uL — ABNORMAL LOW (ref 4.0–10.5)
nRBC: 0 % (ref 0.0–0.2)

## 2020-09-13 LAB — BASIC METABOLIC PANEL - CANCER CENTER ONLY
Anion gap: 8 (ref 5–15)
BUN: 18 mg/dL (ref 8–23)
CO2: 26 mmol/L (ref 22–32)
Calcium: 8.7 mg/dL — ABNORMAL LOW (ref 8.9–10.3)
Chloride: 97 mmol/L — ABNORMAL LOW (ref 98–111)
Creatinine: 0.95 mg/dL (ref 0.44–1.00)
GFR, Estimated: >60 mL/min (ref >60)
Glucose, Bld: 98 mg/dL (ref 70–99)
Potassium: 4.3 mmol/L (ref 3.5–5.1)
Sodium: 131 mmol/L — ABNORMAL LOW (ref 135–145)

## 2020-09-13 LAB — MAGNESIUM: Magnesium: 1.7 mg/dL (ref 1.7–2.4)

## 2020-09-13 MED ORDER — HEPARIN SOD (PORK) LOCK FLUSH 100 UNIT/ML IV SOLN
500.0000 [IU] | Freq: Once | INTRAVENOUS | Status: AC
  Administered 2020-09-13: 500 [IU]
  Filled 2020-09-13: qty 5

## 2020-09-13 MED ORDER — SODIUM CHLORIDE 0.9% FLUSH
10.0000 mL | Freq: Once | INTRAVENOUS | Status: AC
Start: 2020-09-13 — End: 2020-09-13
  Administered 2020-09-13: 10 mL
  Filled 2020-09-13: qty 10

## 2020-09-13 MED ORDER — SODIUM CHLORIDE 0.9% FLUSH
10.0000 mL | Freq: Once | INTRAVENOUS | Status: AC
  Administered 2020-09-13: 10 mL
  Filled 2020-09-13: qty 10

## 2020-09-13 MED ORDER — SODIUM CHLORIDE 0.9 % IV SOLN
Freq: Once | INTRAVENOUS | Status: AC
  Filled 2020-09-13: qty 250

## 2020-09-13 NOTE — Progress Notes (Signed)
[facility] CONSULT NOTE  Patient Care Team: Alphia Moh, MD as PCP - General (Family Medicine) Malmfelt, Stephani Police, RN as Oncology Nurse Navigator Charolett Bumpers, Mike Craze, MD as Referring Physician (Otolaryngology) Eppie Gibson, MD as Consulting Physician (Radiation Oncology) Benay Pike, MD as Consulting Physician (Hematology and Oncology)  CHIEF COMPLAINTS/PURPOSE OF CONSULTATION:  Tonsillar cancer follow up before week 7 of cisplatin  ASSESSMENT & PLAN:   Squamous cell carcinoma of oropharynx [facility]) This is a very pleasant 70 year old female patient with a newly diagnosed squamous cell carcinoma of the oropharynx, T2N2cM0, 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 She is here for cycle 7 of planned chemotherapy.  Review of systems consistent with severe nausea, fatigue, ongoing tinnitus, intermittent diarrhea and severe pain from mucositis. Physical examination, she appears well but appears exhausted.  No dose-limiting toxicity but patient wants to omit 7 cycle of weekly cisplatin, hence this will be removed from her treatment regimen. We will arrange for intravenous fluids, she feels better after the fluids. CBC with preliminary total white count better than last visit however complete counts are pending at this time. She will return to clinic for follow-up with me in 4 weeks.  Mucositis due to antineoplastic therapy Moderate to severe pain from antineoplastic therapy related mucositis. She is currently on oxycodone twice a day, she is not needing a refill at this time.  She will contact us when she is about to run out of her previous medication.  Okay to continue this for now.  G tube feedings (HCC) G-tube feedings, patient is using about 3 to 4 cans of Osmolite a day.   She has dropped about 2 and half pounds weight since last visit She will have to continue G-tube feedings for now until mucositis improves and she can tolerate  some oral diet.  Chemotherapy-induced nausea Continue as needed Compazine and Zofran.  No orders of the defined types were placed in this encounter.   HISTORY OF PRESENTING ILLNESS:   Berenice Oehlert 70 y.o. female is here because of SCC oropharynx, P 16 +  Chronology  This is a very pleasant 70 year old female patient with newly diagnosed oropharyngeal cancer referred to medical oncology for recommendations.  During her last visit we discussed about concurrent chemoradiation given bilateral disease. She is now post 6 cycles of cisplatin weekly chemotherapy.  Interval History Patient is here for follow-up prior to week 7 of cisplatin She is exhausted, wants to omit cycle 7 of planned chemotherapy. She is feeling very nauseous, using Compazine every 6 hours for nausea and uses Zofran before she goes to bed.  No vomiting. She is using G-tube feeding, cannot eat by mouth, she is drinking water.  She uses about 3 to 4 cans of Osmolite every day.  Weight is about 2.5 lbs lower compared to last visit.  She is having some intermittent diarrhea which is likely from the G-tube feeding, not bothersome enough to take any medicine.  Oxycodone has been helping the pain, she uses it twice a day to manage her pain. Rest of the pertinent 10 point ROS reviewed and negative.  MEDICAL HISTORY:  Past Medical History:  Diagnosis Date   Cancer [facility])    Hypertension     SURGICAL HISTORY: Past Surgical History:  Procedure Laterality Date   IR CM INJ ANY COLONIC TUBE W/FLUORO  08/09/2020   IR GASTROSTOMY TUBE MOD SED  07/28/2020   IR IMAGING GUIDED PORT INSERTION  07/28/2020  SOCIAL HISTORY: Social History   Socioeconomic History   Marital status: Single    Spouse name: Not on file   Number of children: Not on file   Years of education: Not on file   Highest education level: Not on file  Occupational History   Not on file  Tobacco Use   Smoking status: Every Day    Packs/day: 1.00    Years:  15.00    Pack years: 15.00    Types: Cigarettes   Smokeless tobacco: Never  Substance and Sexual Activity   Alcohol use: Not on file   Drug use: Not on file   Sexual activity: Not on file  Other Topics Concern   Not on file  Social History Narrative   Not on file   Social Determinants of Health   Financial Resource Strain: Low Risk    Difficulty of Paying Living Expenses: Not very hard  Food Insecurity: No Food Insecurity   Worried About Running Out of Food in the Last Year: Never true   [place] in the Last Year: Never true  Transportation Needs: No Transportation Needs   Lack of Transportation (Medical): No   Lack of Transportation (Non-Medical): No  Physical Activity: Not on file  Stress: Not on file  Social Connections: Not on file  Intimate Partner Violence: Not At Risk   Fear of Current or Ex-Partner: No   Emotionally Abused: No   Physically Abused: No   Sexually Abused: No    FAMILY HISTORY: No family history on file.  ALLERGIES:  is allergic to atorvastatin.  MEDICATIONS:  Current Outpatient Medications  Medication Sig Dispense Refill   albuterol (VENTOLIN HFA) 108 (90 Base) MCG/ACT inhaler Inhale 1-2 puffs into the lungs every 6 (six) hours as needed for wheezing or shortness of breath.     amLODipine (NORVASC) 5 MG tablet Take 5 mg by mouth daily.     aspirin 81 MG chewable tablet Chew 81 mg by mouth daily.     buPROPion (WELLBUTRIN SR) 150 MG 12 hr tablet Start one week before quit date. Take 1 tab daily x 3 days, then 1 tab BID thereafter. (Patient taking differently: Take 150 mg by mouth daily.) 60 tablet 2   calcium citrate-vitamin D (CITRACAL+D) 315-200 MG-UNIT tablet Take 1 tablet by mouth 2 (two) times daily.     carvedilol (COREG) 6.25 MG tablet Take 6.25 mg by mouth 2 (two) times daily with a meal.     Cholecalciferol (VITAMIN D3) 125 MCG (5000 UT) CAPS Take 5,000 Units by mouth daily.     co-enzyme Q-10 30 MG capsule Take 30 mg by mouth  daily.     cyclobenzaprine (FLEXERIL) 10 MG tablet Take 10 mg by mouth 3 (three) times daily as needed for muscle spasms.     dexamethasone (DECADRON) 4 MG tablet Take 2 tablets (8 mg total) by mouth daily. Take daily x 3 days starting the day after cisplatin chemotherapy. Take with food. 30 tablet 1   diphenoxylate-atropine (LOMOTIL) 2.5-0.025 MG tablet Take 1 tablet by mouth 4 (four) times daily as needed for diarrhea or loose stools. 30 tablet 0   Evolocumab (REPATHA SURECLICK) 662 MG/ML SOAJ Inject 140 mg into the skin every 14 (fourteen) days.     Flaxseed, Linseed, (FLAXSEED OIL) 1000 MG CAPS Take 2,000 mg by mouth daily.     lidocaine (XYLOCAINE) 2 % solution Patient: Mix 1part 2% viscous lidocaine, 1part H20. Swish & swallow 28mL of diluted mixture,  47min before meals and at bedtime, up to QID 200 mL 3   lidocaine-prilocaine (EMLA) cream Apply to affected area once 30 g 3   lisinopril (ZESTRIL) 20 MG tablet Take 1 tablet by mouth daily.     Multiple Vitamins-Minerals (MULTIVITAMIN WOMEN 50+ PO) Take 1 tablet by mouth daily.     nicotine (NICODERM CQ - DOSED IN MG/24 HOURS) 21 mg/24hr patch Place 21 mg onto the skin daily.     Nutritional Supplements (FEEDING SUPPLEMENT, OSMOLITE 1.5 CAL,) LIQD Osmolite 1.5 x 6 cartons/day - Give 1 1/2 cartons via PEG QID. Flush tube with 60 ml water before and after each feeding. Drink by mouth or give via tube additional 720 ml (3c.) water daily. 1422 mL 0   omeprazole (PRILOSEC) 20 MG capsule Take 20 mg by mouth daily.     ondansetron (ZOFRAN) 8 MG tablet Take 1 tablet (8 mg total) by mouth 2 (two) times daily as needed. Start on the third day after cisplatin chemotherapy. 30 tablet 1   Oxycodone HCl 10 MG TABS Take 1 tablet (10 mg total) by mouth every 8 (eight) hours as needed (cancer pain). 40 tablet 0   potassium chloride (KLOR-CON) 10 MEQ tablet Take 10 mEq by mouth daily.     predniSONE (DELTASONE) 20 MG tablet Take 20 mg by mouth daily as needed  (COPD).     prochlorperazine (COMPAZINE) 10 MG tablet Take 1 tablet (10 mg total) by mouth every 6 (six) hours as needed (Nausea or vomiting). 30 tablet 1   sharps container Use as directed for sharps disposal     SPIRIVA RESPIMAT 2.5 MCG/ACT AERS Inhale 2 puffs into the lungs daily.     sucralfate (CARAFATE) 1 GM/10ML suspension Take 10 mLs (1 g total) by mouth 4 (four) times daily -  with meals and at bedtime. 420 mL 2   vitamin B-12 (CYANOCOBALAMIN) 500 MCG tablet Take 500 mcg by mouth daily.     No current facility-administered medications for this visit.    PHYSICAL EXAMINATION:  ECOG PERFORMANCE STATUS: 1 - Symptomatic but completely ambulatory  Vitals:   09/13/20 1006  BP: 104/64  Pulse: 92  Resp: 18  Temp: 98.7 F (37.1 C)  SpO2: 100%    Filed Weights   09/13/20 1006  Weight: 149 lb 8 oz (67.8 kg)    Physical Exam Constitutional:      Appearance: Normal appearance.  HENT:     Head: Normocephalic and atraumatic.     Mouth/Throat:     Pharynx: Oropharyngeal exudate and posterior oropharyngeal erythema present.  Cardiovascular:     Rate and Rhythm: Normal rate and regular rhythm.     Pulses: Normal pulses.     Heart sounds: Normal heart sounds.  Pulmonary:     Effort: Pulmonary effort is normal.     Breath sounds: Normal breath sounds.  Abdominal:     General: Abdomen is flat. Bowel sounds are normal.     Palpations: Abdomen is soft.  Musculoskeletal:     Cervical back: Normal range of motion and neck supple.  Lymphadenopathy:     Cervical: No cervical adenopathy.  Skin:    General: Skin is warm and dry.     Findings: Erythema (Changes consistent with ongoing radiation, no ulceration or evidence of infection) and rash present.  Neurological:     General: No focal deficit present.     Mental Status: She is alert.  Psychiatric:        Mood  and Affect: Mood normal.   LABORATORY DATA:    Lab Results  Component Value Date   WBC 2.5 (L) 09/13/2020   HGB  10.8 (L) 09/13/2020   HCT 30.7 (L) 09/13/2020   MCV 90.3 09/13/2020   PLT 101 (L) 09/13/2020     Chemistry      Component Value Date/Time   NA 132 (L) 09/06/2020 0830   K 4.6 09/06/2020 0830   CL 96 (L) 09/06/2020 0830   CO2 27 09/06/2020 0830   BUN 15 09/06/2020 0830   CREATININE 1.01 (H) 09/06/2020 0830      Component Value Date/Time   CALCIUM 9.1 09/06/2020 0830   ALKPHOS 55 07/13/2020 1239   AST 14 (L) 07/13/2020 1239   ALT 8 07/13/2020 1239   BILITOT 0.3 07/13/2020 1239       In situ study for HPV 16/18 is negative. Slides will ne sent to [facility] for a broader HR HPV panel  Addendum electronically signed by Isac Caddy Askin, MD on 06/18/2020 at  5:04 PM  Addendum    The p16 immunostain is positive. HR HPV study ordered.  Addendum electronically signed by Isac Caddy Askin, MD on 06/18/2020 at  1:57 PM  Diagnosis    A: Throat, right, biopsy - Fragments of keratinizing well differentiated squamous carcinoma.  A single focus of submucosa with invasive carcinoma is present   Addendum 3    Noted 07/01/2000: Tissue was sent to [facility] for a broad HR HPV ISH study.  Results were: Negative.  The controls were appropriate    Prelim CBC results showed improved total white blood cell count, hemoglobin of 10.8 and platelet count 101,000, differential  pending  RADIOGRAPHIC STUDIES: I have personally reviewed the radiological images as listed and agreed with the findings in the report.   No results found.    Benay Pike, MD 09/13/2020 10:34 AM

## 2020-09-13 NOTE — Assessment & Plan Note (Signed)
Moderate to severe pain from antineoplastic therapy related mucositis. She is currently on oxycodone twice a day, she is not needing a refill at this time.  She will contact us when she is about to run out of her previous medication.  Okay to continue this for now.

## 2020-09-13 NOTE — Progress Notes (Signed)
Brief nutrition follow-up completed with patient during IV fluids for cancer of the oropharynx.   Weight stable at 149.5 pounds on July 18.  Patient reports increased nausea but she controls this with Compazine.  She denies diarrhea and constipation.  She is not eating by mouth.  She is using 4 cartons of Osmolite 1.5.  She continues to have lower extremity edema which she says is not a new issue and was experienced prior to cancer diagnosis.  Estimated nutrition needs: 2070-2215 cal, 110-125 g protein, 2.2 L fluid.  Nutrition diagnosis: Inadequate oral intake continues.  Intervention: Increase Osmolite 1.5-1 and half cartons 4 times daily via PEG with 60 mL free water before and after bolus feedings.  Be sure to give tube feeding slowly over 20 minutes. Continue free water flushes as tolerated between meals. 6 cartons Osmolite 1.5 with free water flushes provides 2130 cal, 89.4 g protein, 2286 mL free water.  Monitoring, evaluation, goals: Patient will tolerate increased tube feeding to meet greater than 90% minimum estimated nutrition needs for weight maintenance.  Next visit: To be scheduled.  Patient has RD contact information for needs prior to next appointment.  **Disclaimer: This note was dictated with voice recognition software. Similar sounding words can inadvertently be transcribed and this note may contain transcription errors which may not have been corrected upon publication of note.**

## 2020-09-13 NOTE — Assessment & Plan Note (Signed)
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 She is here for cycle 7 of planned chemotherapy.  Review of systems consistent with severe nausea, fatigue, ongoing tinnitus, intermittent diarrhea and severe pain from mucositis. Physical examination, she appears well but appears exhausted.  No dose-limiting toxicity but patient wants to omit 7 cycle of weekly cisplatin, hence this will be removed from her treatment regimen. We will arrange for intravenous fluids, she feels better after the fluids. CBC with preliminary total white count better than last visit however complete counts are pending at this time. She will return to clinic for follow-up with me in 4 weeks.

## 2020-09-13 NOTE — Assessment & Plan Note (Signed)
Continue as needed Compazine and Zofran.

## 2020-09-13 NOTE — Assessment & Plan Note (Signed)
G-tube feedings, patient is using about 3 to 4 cans of Osmolite a day.   She has dropped about 2 and half pounds weight since last visit She will have to continue G-tube feedings for now until mucositis improves and she can tolerate some oral diet.

## 2020-09-13 NOTE — Patient Instructions (Signed)
Rehydration, Elderly Rehydration is the replacement of body fluids, salts, and minerals (electrolytes) that are lost during dehydration. Dehydration is when there is not enough water or other fluids in the body. This happens when you lose more fluids than you take in. People who are age 68 or older have a higher risk of dehydration than younger adults. Common causes of dehydration include: Conditions that cause loss of water or other fluids, such as diarrhea, vomiting, sweating, or urinating a lot. Not drinking enough fluids. This can occur when you are ill or doing activities that require a lot of energy, especially in hot weather. Other illnesses and conditions, such as fever or infection. Certain medicines, such as those that remove excess fluid from the body (diuretics). Not being able to get enough water and food. Symptoms of mild or moderate dehydration may include thirst, dry lips and mouth, and dizziness. Symptoms of severe dehydration may include increased heart rate, confusion, fainting, and not urinating. For severe dehydration, you may need to get fluids through an IV at the hospital. For mild or moderate dehydration, you can usually rehydrate at home by drinking certain fluids as told by your health care provider. What are the risks? Generally, rehydration is safe. However, taking in too much fluid (overhydration) can be a problem. This is rare. Overhydration can cause an electrolyte imbalance, kidney failure, fluid in the lungs, or a decrease in salt (sodium) levels in the body. Supplies needed: You will need an oral rehydration solution (ORS) if your health care provider tells you to use one. This is a drink designed to treat dehydration. It can be found in pharmacies and retail stores. How to rehydrate Fluids Follow instructions from your health care provider for rehydration. The kind of fluid and the amount you should drink depend on your condition. In general, for mild dehydration,  you should choose drinks that you prefer. If told by your health care provider, drink an ORS. Make an ORS by following instructions on the package. Start by drinking small amounts, about  cup (120 mL) every 5-10 minutes. Slowly increase how much you drink until you have taken the amount recommended by your health care provider. Drink enough fluids to keep your urine pale yellow. If you were told to drink an ORS, finish the ORS first, then start slowly drinking other clear fluids. Drink fluids such as: Water. This includes sparkling water and flavored water. Drinking only waterwhile rehydrating can lead to having too little sodium in your body (hyponatremia). Follow instructions from your health care provider. Water from ice chips you suck on. Fruit juice with water you add to it(diluted). Sports drinks. Hot or cold herbal teas. Broth-based soups. Coffee. Milk or milk products. Food Follow instructions from your health care provider about what to eat while you rehydrate. Your health care provider may recommend that you slowly begin eating regular foods in small amounts. Eat foods that contain a healthy balance of electrolytes, such as bananas, oranges, potatoes, tomatoes, and spinach. Avoid foods that are greasy or contain a lot of sugar. In some cases, you may get nutrition through a feeding tube that is passed through your nose and into your stomach (nasogastric tube, or NG tube). This may be done if you have uncontrolled vomiting or diarrhea. Beverages to avoid Certain beverages may make dehydration worse. While you rehydrate, avoid drinking alcohol. How to tell if you are recovering from dehydration You may be recovering from dehydration if: You are urinating more often than before you   started rehydrating. Your urine is pale yellow. Your energy level improves. You vomit less frequently. You have diarrhea less frequently. Your appetite improves or returns to normal. You feel less  dizzy or less light-headed. Your skin tone and color start to look more normal. Follow these instructions at home: Take over-the-counter and prescription medicines only as told by your health care provider. Do not take sodium tablets. Doing this can lead to having too much sodium in your body (hypernatremia). Contact a health care provider if: You continue to have symptoms of mild or moderate dehydration, such as: Thirst. Dry lips. Slightly dry mouth. Dizziness. Dark urine or less urine than usual. Muscle cramps. You continue to vomit or have diarrhea. Get help right away if you: Have symptoms of dehydration that get worse. Have a fever. Have a severe headache. Have been vomiting and the following happens: Your vomiting gets worse. Your vomit includes blood or green matter (bile). You cannot eat or drink without vomiting. Have problems with urination or bowel movements, such as: Diarrhea that gets worse. Blood in your stool (feces). This may cause stool to look black and tarry. Not urinating, or urinating only a small amount of very dark urine, within 6-8 hours. Have trouble breathing. Have symptoms that get worse with treatment. These symptoms may represent a serious problem that is an emergency. Do not wait to see if the symptoms will go away. Get medical help right away. Call your local emergency services (911 in the [place] Do not drive yourself to the hospital. Summary Rehydration is the replacement of body fluids, salts, and minerals (electrolytes) that are lost during dehydration. Follow instructions from your health care provider for rehydration. The kind of fluid and the amount you should drink depend on your condition. Slowly increase how much you drink until you have taken the amount recommended by your health care provider. Contact your health care provider if you continue to show signs of mild or moderate dehydration. This information is not intended to replace advice  given to you by your health care provider. Make sure you discuss any questions you have with your health care provider. Document Revised: 04/16/2019 Document Reviewed: 04/03/2019 Elsevier Patient Education  2022 Elsevier Inc.  

## 2020-09-14 ENCOUNTER — Encounter: Payer: Medicare Other | Admitting: Nutrition

## 2020-09-14 ENCOUNTER — Inpatient Hospital Stay: Payer: Medicare Other

## 2020-09-14 ENCOUNTER — Ambulatory Visit: Payer: Medicare Other

## 2020-09-15 ENCOUNTER — Other Ambulatory Visit: Payer: Self-pay

## 2020-09-15 ENCOUNTER — Ambulatory Visit
Admission: RE | Admit: 2020-09-15 | Discharge: 2020-09-15 | Disposition: A | Discharge status: Home / Self Care | Payer: Medicare Other | Source: Ambulatory Visit | Attending: Radiation Oncology | Admitting: Radiation Oncology

## 2020-09-15 DIAGNOSIS — C090 Malignant neoplasm of tonsillar fossa: Secondary | ICD-10-CM | POA: Diagnosis not present

## 2020-09-15 NOTE — Progress Notes (Signed)
Oncology Nurse Navigator Documentation   I spoke with Ms. Saal today after her radiation treatment. I discussed her appointments scheduled for tomorrow with Garald Balding SLP and IVF. She verbalized to me that she would like to cancel both those appointments because she is very fatigued due to her radiation/chemotherapy treatments. I advised her that she should see Glendell Docker to have her swallowing evaluated but she politely declined. She reports that she is able to hydrate well via her PEG tube and doesn't feel extra IVF are necessary tomorrow. I have cancelled both appointments in Epic.   Harlow Asa RN, BSN, OCN Head & Neck Oncology Nurse [place] at [facility] Phone # [phone]  Fax # [phone]

## 2020-09-16 ENCOUNTER — Ambulatory Visit: Payer: Medicare Other

## 2020-09-16 ENCOUNTER — Other Ambulatory Visit: Payer: Self-pay

## 2020-09-16 ENCOUNTER — Inpatient Hospital Stay: Payer: Medicare Other

## 2020-09-16 ENCOUNTER — Ambulatory Visit
Admission: RE | Admit: 2020-09-16 | Discharge: 2020-09-16 | Disposition: A | Discharge status: Home / Self Care | Payer: Medicare Other | Source: Ambulatory Visit | Attending: Radiation Oncology | Admitting: Radiation Oncology

## 2020-09-16 DIAGNOSIS — C090 Malignant neoplasm of tonsillar fossa: Secondary | ICD-10-CM | POA: Diagnosis not present

## 2020-09-17 ENCOUNTER — Ambulatory Visit
Admission: RE | Admit: 2020-09-17 | Discharge: 2020-09-17 | Disposition: A | Discharge status: Home / Self Care | Payer: Medicare Other | Source: Ambulatory Visit | Attending: Radiation Oncology | Admitting: Radiation Oncology

## 2020-09-17 DIAGNOSIS — C090 Malignant neoplasm of tonsillar fossa: Secondary | ICD-10-CM | POA: Diagnosis not present

## 2020-09-20 ENCOUNTER — Other Ambulatory Visit: Payer: Self-pay | Admitting: Nurse Practitioner

## 2020-09-20 ENCOUNTER — Ambulatory Visit
Admission: RE | Admit: 2020-09-20 | Discharge: 2020-09-20 | Disposition: A | Discharge status: Home / Self Care | Payer: Medicare Other | Source: Ambulatory Visit | Attending: Radiation Oncology | Admitting: Radiation Oncology

## 2020-09-20 ENCOUNTER — Other Ambulatory Visit: Payer: Self-pay

## 2020-09-20 DIAGNOSIS — C090 Malignant neoplasm of tonsillar fossa: Secondary | ICD-10-CM | POA: Diagnosis not present

## 2020-09-21 ENCOUNTER — Other Ambulatory Visit: Payer: Self-pay | Admitting: Radiation Oncology

## 2020-09-21 ENCOUNTER — Ambulatory Visit
Admission: RE | Admit: 2020-09-21 | Discharge: 2020-09-21 | Disposition: A | Discharge status: Home / Self Care | Payer: Medicare Other | Source: Ambulatory Visit | Attending: Radiation Oncology | Admitting: Radiation Oncology

## 2020-09-21 ENCOUNTER — Other Ambulatory Visit: Payer: Self-pay

## 2020-09-21 DIAGNOSIS — C090 Malignant neoplasm of tonsillar fossa: Secondary | ICD-10-CM

## 2020-09-21 DIAGNOSIS — C109 Malignant neoplasm of oropharynx, unspecified: Secondary | ICD-10-CM

## 2020-09-21 MED ORDER — OMEPRAZOLE 2 MG/ML ORAL SUSPENSION: 20.0000 mg | Freq: Every day | ORAL | 0 refills | Status: AC

## 2020-09-21 MED ORDER — SCOPOLAMINE 1 MG/3DAYS TD PT72: 1.0000 | MEDICATED_PATCH | TRANSDERMAL | 1 refills | Status: DC

## 2020-09-21 MED ORDER — FLUCONAZOLE 10 MG/ML PO SUSR: ORAL | 0 refills | Status: DC

## 2020-09-22 ENCOUNTER — Ambulatory Visit
Admission: RE | Admit: 2020-09-22 | Discharge: 2020-09-22 | Disposition: A | Discharge status: Home / Self Care | Payer: Medicare Other | Source: Ambulatory Visit | Attending: Radiation Oncology | Admitting: Radiation Oncology

## 2020-09-22 ENCOUNTER — Other Ambulatory Visit: Payer: Self-pay

## 2020-09-22 ENCOUNTER — Other Ambulatory Visit: Payer: Self-pay | Admitting: Nurse Practitioner

## 2020-09-22 ENCOUNTER — Ambulatory Visit: Payer: Medicare Other

## 2020-09-22 DIAGNOSIS — C090 Malignant neoplasm of tonsillar fossa: Secondary | ICD-10-CM | POA: Diagnosis not present

## 2020-09-23 ENCOUNTER — Other Ambulatory Visit: Payer: Self-pay | Admitting: Hematology and Oncology

## 2020-09-23 ENCOUNTER — Ambulatory Visit
Admission: RE | Admit: 2020-09-23 | Discharge: 2020-09-23 | Disposition: A | Discharge status: Home / Self Care | Payer: Medicare Other | Source: Ambulatory Visit | Attending: Radiation Oncology | Admitting: Radiation Oncology

## 2020-09-23 ENCOUNTER — Encounter: Payer: Self-pay | Admitting: Radiation Oncology

## 2020-09-23 ENCOUNTER — Encounter: Payer: Self-pay | Admitting: Nurse Practitioner

## 2020-09-23 DIAGNOSIS — C090 Malignant neoplasm of tonsillar fossa: Secondary | ICD-10-CM | POA: Diagnosis not present

## 2020-09-23 MED ORDER — OXYCODONE HCL 10 MG PO TABS: 10.0000 mg | ORAL_TABLET | Freq: Four times a day (QID) | ORAL | 0 refills | Status: DC | PRN

## 2020-09-23 NOTE — Progress Notes (Signed)
Oncology Nurse Navigator Documentation   Met with Ms. Cathy Knight after final RT to offer support and to celebrate end of radiation treatment.   Provided verbal/written post-RT guidance: Importance of keeping all follow-up appts, especially those with Nutrition and SLP. Importance of protecting treatment area from sun. Continuation of Sonafine application 2-3 times daily, application of antibiotic ointment to areas of raw skin; when supply of Sonafine exhausted transition to OTC lotion with vitamin E. Provided/reviewed Epic calendar of upcoming appts. Explained my role as navigator will continue for several more months, encouraged him to call me with needs/concerns.    Cathy Asa RN, BSN, OCN Head & Neck Oncology Nurse [place] at [facility] Phone # [phone]  Fax # [phone]

## 2020-09-23 NOTE — Progress Notes (Signed)
Discussed with patient about pain management. She will stay in touch with [place].  [name]

## 2020-10-04 ENCOUNTER — Other Ambulatory Visit: Payer: Self-pay

## 2020-10-04 ENCOUNTER — Encounter: Payer: Self-pay | Admitting: Nurse Practitioner

## 2020-10-04 DIAGNOSIS — C109 Malignant neoplasm of oropharynx, unspecified: Secondary | ICD-10-CM

## 2020-10-04 MED ORDER — PROCHLORPERAZINE MALEATE 10 MG PO TABS: 10.0000 mg | ORAL_TABLET | Freq: Four times a day (QID) | ORAL | 1 refills | Status: DC | PRN

## 2020-10-06 ENCOUNTER — Ambulatory Visit: Payer: Medicare Other | Attending: Radiation Oncology | Admitting: Physical Therapy

## 2020-10-06 MED ORDER — TIOTROPIUM BROMIDE 2.5 MCG/ACTUATION MIST FOR INHALATION
Freq: Every day | RESPIRATORY_TRACT | 11 refills | 0 days | Status: CP
Start: 2020-10-06 — End: ?

## 2020-10-06 NOTE — Unmapped (Signed)
-----   Message from Shawn Route sent at 10/06/2020  9:06 AM EDT -----  Regarding: RE: Request for pt to be scheduled for a return ENT appointment  Left vcmail  ----- Message -----  From: Suzy Bouchard, RN  Sent: 10/06/2020   9:01 AM EDT  To: Suzy Bouchard, RN, #  Subject: Request for pt to be scheduled for a return #    Hi ENT team,    Pt will need to be scheduled a return ENT app. She is post chemoradiation & will need to be followed for surveillance.    Best,  [employer]

## 2020-10-06 NOTE — Unmapped (Signed)
I attempted to reach the pt to ask when she finished her XRT Tx so that we can get her scheduled a return ENT app.  I reached her voice message. I left a message with my contact information & asked that she call back.

## 2020-10-06 NOTE — Unmapped (Signed)
-----   Message from Suzy Bouchard, [place] sent at 10/06/2020  8:59 AM EDT -----  Regarding: Request for pt to be scheduled for a return ENT appointment  Hi ENT team,    Pt will need to be scheduled a return ENT app. She is post chemoradiation & will need to be followed for surveillance.    Best,  [employer]

## 2020-10-08 NOTE — Unmapped (Signed)
The [facility] has made a second and final attempt to reach this patient to refill the following medication:Repatha.      We have left voicemails on the following phone numbers: [phone].    Dates contacted: 10/01/20, 10/08/20  Last scheduled delivery: 07/22/20    The patient may be at risk of non-compliance with this medication. The patient should call the [facility] at [phone]  Option 4, then Option 2 (all other specialty patients) to refill medication.    Tanya Gordon   [facility]

## 2020-10-12 ENCOUNTER — Encounter: Payer: Self-pay | Admitting: Hematology and Oncology

## 2020-10-12 ENCOUNTER — Other Ambulatory Visit: Payer: Self-pay

## 2020-10-12 ENCOUNTER — Inpatient Hospital Stay: Payer: Medicare Other | Attending: Hematology and Oncology

## 2020-10-12 ENCOUNTER — Inpatient Hospital Stay (HOSPITAL_BASED_OUTPATIENT_CLINIC_OR_DEPARTMENT_OTHER): Payer: Medicare Other | Admitting: Hematology and Oncology

## 2020-10-12 ENCOUNTER — Other Ambulatory Visit: Payer: Self-pay | Admitting: Hematology and Oncology

## 2020-10-12 ENCOUNTER — Encounter: Payer: Self-pay | Admitting: Nutrition

## 2020-10-12 DIAGNOSIS — M7989 Other specified soft tissue disorders: Secondary | ICD-10-CM | POA: Insufficient documentation

## 2020-10-12 DIAGNOSIS — C109 Malignant neoplasm of oropharynx, unspecified: Secondary | ICD-10-CM | POA: Insufficient documentation

## 2020-10-12 DIAGNOSIS — R634 Abnormal weight loss: Secondary | ICD-10-CM | POA: Insufficient documentation

## 2020-10-12 DIAGNOSIS — Z9221 Personal history of antineoplastic chemotherapy: Secondary | ICD-10-CM | POA: Insufficient documentation

## 2020-10-12 DIAGNOSIS — K1231 Oral mucositis (ulcerative) due to antineoplastic therapy: Secondary | ICD-10-CM

## 2020-10-12 DIAGNOSIS — C090 Malignant neoplasm of tonsillar fossa: Secondary | ICD-10-CM | POA: Diagnosis present

## 2020-10-12 DIAGNOSIS — Z923 Personal history of irradiation: Secondary | ICD-10-CM | POA: Diagnosis not present

## 2020-10-12 DIAGNOSIS — Z95828 Presence of other vascular implants and grafts: Secondary | ICD-10-CM

## 2020-10-12 LAB — CBC WITH DIFFERENTIAL (CANCER CENTER ONLY)
Abs Immature Granulocytes: 0.06 10*3/uL (ref 0.00–0.07)
Basophils Absolute: 0 10*3/uL (ref 0.0–0.1)
Basophils Relative: 1 %
Eosinophils Absolute: 0 10*3/uL (ref 0.0–0.5)
Eosinophils Relative: 0 %
HCT: 28.2 % — ABNORMAL LOW (ref 36.0–46.0)
Hemoglobin: 9.5 g/dL — ABNORMAL LOW (ref 12.0–15.0)
Immature Granulocytes: 1 %
Lymphocytes Relative: 25 %
Lymphs Abs: 1.4 10*3/uL (ref 0.7–4.0)
MCH: 32.8 pg (ref 26.0–34.0)
MCHC: 33.7 g/dL (ref 30.0–36.0)
MCV: 97.2 fL (ref 80.0–100.0)
Monocytes Absolute: 0.8 10*3/uL (ref 0.1–1.0)
Monocytes Relative: 14 %
Neutro Abs: 3.4 10*3/uL (ref 1.7–7.7)
Neutrophils Relative %: 59 %
Platelet Count: 330 10*3/uL (ref 150–400)
RBC: 2.9 MIL/uL — ABNORMAL LOW (ref 3.87–5.11)
RDW: 19.7 % — ABNORMAL HIGH (ref 11.5–15.5)
WBC Count: 5.7 10*3/uL (ref 4.0–10.5)
nRBC: 0 % (ref 0.0–0.2)

## 2020-10-12 LAB — BASIC METABOLIC PANEL - CANCER CENTER ONLY
Anion gap: 11 (ref 5–15)
BUN: 12 mg/dL (ref 8–23)
CO2: 25 mmol/L (ref 22–32)
Calcium: 9.1 mg/dL (ref 8.9–10.3)
Chloride: 96 mmol/L — ABNORMAL LOW (ref 98–111)
Creatinine: 0.85 mg/dL (ref 0.44–1.00)
GFR, Estimated: >60 mL/min (ref >60)
Glucose, Bld: 120 mg/dL — ABNORMAL HIGH (ref 70–99)
Potassium: 3.9 mmol/L (ref 3.5–5.1)
Sodium: 132 mmol/L — ABNORMAL LOW (ref 135–145)

## 2020-10-12 LAB — MAGNESIUM: Magnesium: 1.5 mg/dL — ABNORMAL LOW (ref 1.7–2.4)

## 2020-10-12 MED ORDER — SODIUM CHLORIDE 0.9% FLUSH: 10.0000 mL | Freq: Once | INTRAVENOUS | Status: DC

## 2020-10-12 MED ORDER — MAGNESIUM OXIDE -MG SUPPLEMENT 400 (240 MG) MG PO TABS: 400.0000 mg | ORAL_TABLET | Freq: Two times a day (BID) | ORAL | 0 refills | Status: DC

## 2020-10-12 MED ORDER — OXYCODONE HCL 10 MG PO TABS: 10.0000 mg | ORAL_TABLET | Freq: Two times a day (BID) | ORAL | 0 refills | Status: DC | PRN

## 2020-10-12 MED ORDER — HEPARIN SOD (PORK) LOCK FLUSH 100 UNIT/ML IV SOLN: 500.0000 [IU] | Freq: Once | INTRAVENOUS | Status: DC

## 2020-10-12 NOTE — Assessment & Plan Note (Signed)
Ongoing weight loss of about 8 pounds since last visit.  Recommended increase G-tube feeding to at least 4 a day and monitor.

## 2020-10-12 NOTE — Progress Notes (Signed)
Cathy Knight CONSULT NOTE  Patient Care Team: Alphia Moh, MD as PCP - General (Family Medicine) Malmfelt, Stephani Police, RN as Oncology Nurse Navigator Charolett Bumpers, Mike Craze, MD as Referring Physician (Otolaryngology) Eppie Gibson, MD as Consulting Physician (Radiation Oncology) Benay Pike, MD as Consulting Physician (Hematology and Oncology)  CHIEF COMPLAINTS/PURPOSE OF CONSULTATION:  Tonsillar cancer follow up before week 7 of cisplatin  ASSESSMENT & PLAN:   Squamous cell carcinoma of oropharynx [facility]) This is a very pleasant 70 year old female patient with a newly diagnosed squamous cell carcinoma of the oropharynx, T2N2cM0, 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 She received 6 out of 7 planned cycles of chemotherapy.  Last treatment on September 07, 2020.  She is here for follow-up after completion of chemoradiation.  She continues to feel very tired, pain in the mouth is still very bothersome.  She is disappointed that she has not been recovering as expected.  She we have recommended that she increase her G-tube feedings as recommended, follow-up with [place] in 4 weeks for repeat labs.  We have discussed that the expected recovery time may take several months depending on the patient.  She will get her end of treatment PET/CT in later part of October and follow-up with Dr. Isidore Moos.  Mucositis due to antineoplastic therapy She will continue with oxycodone 10 mg twice a day as needed for pain control.  This will be refilled today.  Weight loss Ongoing weight loss of about 8 pounds since last visit.  Recommended increase G-tube feeding to at least 4 a day and monitor.  Swelling of lower extremity Symmetrical in bilateral lower extremities.  Less concerning for a DVT.  This is likely from hypoalbuminemia and positional. Encouraged to increase oral feeding, elevation of legs. If asymmetrical, she was encouraged to contact us asap for DVT  evaluation.  No orders of the defined types were placed in this encounter.   HISTORY OF PRESENTING ILLNESS:   Cathy Knight 70 y.o. female is here because of SCC oropharynx, P 16 +  Chronology  This is a very pleasant 70 year old female patient with newly diagnosed oropharyngeal cancer referred to medical oncology for recommendations.  During her last visit we discussed about concurrent chemoradiation given bilateral disease. She completed radiation on September 23, 2020. Cycle 6 of chemotherapy completed on September 07, 2020.  Cycle 7 of chemotherapy omitted because of worsening performance status and poor tolerance.  Interval History  She is here for follow-up with her daughter-in-law.  She continues to feel very tired, has not been using her G-tube like before, lost about 8 pounds since her last visit.  She is trying to eat some soft foods.  Pain in the mouth is still quite bothersome, she has been taking oxycodone about twice a day.  No hearing changes, tinnitus, neuropathy from cisplatin.  She is tired of disappointed that she has not felt bad..  No change in bowel habits or urinary habits.  Both legs are swollen and she attributes this to his staying seated with her legs dangling.  Rest of the pertinent 10 point ROS reviewed and negative.  MEDICAL HISTORY:  Past Medical History:  Diagnosis Date   Cancer [facility])    Hypertension     SURGICAL HISTORY: Past Surgical History:  Procedure Laterality Date   IR CM INJ ANY COLONIC TUBE W/FLUORO  08/09/2020   IR GASTROSTOMY TUBE MOD SED  07/28/2020   IR IMAGING GUIDED PORT INSERTION  07/28/2020  SOCIAL HISTORY: Social History   Socioeconomic History   Marital status: Single    Spouse name: Not on file   Number of children: Not on file   Years of education: Not on file   Highest education level: Not on file  Occupational History   Not on file  Tobacco Use   Smoking status: Every Day    Packs/day: 1.00    Years: 15.00    Pack years:  15.00    Types: Cigarettes   Smokeless tobacco: Never  Substance and Sexual Activity   Alcohol use: Not on file   Drug use: Not on file   Sexual activity: Not on file  Other Topics Concern   Not on file  Social History Narrative   Not on file   Social Determinants of Health   Financial Resource Strain: Low Risk    Difficulty of Paying Living Expenses: Not very hard  Food Insecurity: No Food Insecurity   Worried About Running Out of Food in the Last Year: Never true   [place] in the Last Year: Never true  Transportation Needs: No Transportation Needs   Lack of Transportation (Medical): No   Lack of Transportation (Non-Medical): No  Physical Activity: Not on file  Stress: Not on file  Social Connections: Not on file  Intimate Partner Violence: Not At Risk   Fear of Current or Ex-Partner: No   Emotionally Abused: No   Physically Abused: No   Sexually Abused: No    FAMILY HISTORY: No family history on file.  ALLERGIES:  is allergic to atorvastatin.  MEDICATIONS:  Current Outpatient Medications  Medication Sig Dispense Refill   albuterol (VENTOLIN HFA) 108 (90 Base) MCG/ACT inhaler Inhale 1-2 puffs into the lungs every 6 (six) hours as needed for wheezing or shortness of breath.     amLODipine (NORVASC) 5 MG tablet Take 5 mg by mouth daily.     aspirin 81 MG chewable tablet Chew 81 mg by mouth daily.     buPROPion (WELLBUTRIN SR) 150 MG 12 hr tablet Start one week before quit date. Take 1 tab daily x 3 days, then 1 tab BID thereafter. (Patient taking differently: Take 150 mg by mouth daily.) 60 tablet 2   calcium citrate-vitamin D (CITRACAL+D) 315-200 MG-UNIT tablet Take 1 tablet by mouth 2 (two) times daily.     carvedilol (COREG) 6.25 MG tablet Take 6.25 mg by mouth 2 (two) times daily with a meal.     Cholecalciferol (VITAMIN D3) 125 MCG (5000 UT) CAPS Take 5,000 Units by mouth daily.     co-enzyme Q-10 30 MG capsule Take 30 mg by mouth daily.     cyclobenzaprine  (FLEXERIL) 10 MG tablet Take 10 mg by mouth 3 (three) times daily as needed for muscle spasms.     dexamethasone (DECADRON) 4 MG tablet Take 2 tablets (8 mg total) by mouth daily. Take daily x 3 days starting the day after cisplatin chemotherapy. Take with food. 30 tablet 1   diphenoxylate-atropine (LOMOTIL) 2.5-0.025 MG tablet Take 1 tablet by mouth 4 (four) times daily as needed for diarrhea or loose stools. 30 tablet 0   Evolocumab (REPATHA SURECLICK) [id number] MG/ML SOAJ Inject 140 mg into the skin every 14 (fourteen) days.     Flaxseed, Linseed, (FLAXSEED OIL) 1000 MG CAPS Take 2,000 mg by mouth daily.     fluconazole (DIFLUCAN) 10 MG/ML suspension Take 60m today, then 159mdaily for 20 more days. 220 mL 0  lidocaine (XYLOCAINE) 2 % solution Patient: Mix 1part 2% viscous lidocaine, 1part H20. Swish & swallow 44m of diluted mixture, 335m before meals and at bedtime, up to QID 200 mL 3   lidocaine-prilocaine (EMLA) cream Apply to affected area once 30 g 3   lisinopril (ZESTRIL) 20 MG tablet Take 1 tablet by mouth daily.     Multiple Vitamins-Minerals (MULTIVITAMIN WOMEN 50+ PO) Take 1 tablet by mouth daily.     nicotine (NICODERM CQ - DOSED IN MG/24 HOURS) 21 mg/24hr patch Place 21 mg onto the skin daily.     Nutritional Supplements (FEEDING SUPPLEMENT, OSMOLITE 1.5 CAL,) LIQD Osmolite 1.5 x 6 cartons/day - Give 1 1/2 cartons via PEG QID. Flush tube with 60 ml water before and after each feeding. Drink by mouth or give via tube additional 720 ml (3c.) water daily. 1422 mL 0   omeprazole (FIRST-OMEPRAZOLE) 2 mg/mL SUSP oral suspension Take 10 mLs (20 mg total) by mouth daily. 300 mL 0   ondansetron (ZOFRAN) 8 MG tablet Take 1 tablet (8 mg total) by mouth 2 (two) times daily as needed. Start on the third day after cisplatin chemotherapy. 30 tablet 1   Oxycodone HCl 10 MG TABS Take 1 tablet (10 mg total) by mouth every 6 (six) hours as needed (cancer pain). 60 tablet 0   potassium chloride (KLOR-CON) 10  MEQ tablet Take 10 mEq by mouth daily.     predniSONE (DELTASONE) 20 MG tablet Take 20 mg by mouth daily as needed (COPD).     prochlorperazine (COMPAZINE) 10 MG tablet Take 1 tablet (10 mg total) by mouth every 6 (six) hours as needed (Nausea or vomiting). 30 tablet 1   scopolamine (TRANSDERM-SCOP) 1 MG/3DAYS Place 1 patch (1.5 mg total) onto the skin every 3 (three) days. 10 patch 1   sharps container Use as directed for sharps disposal     SPIRIVA RESPIMAT 2.5 MCG/ACT AERS Inhale 2 puffs into the lungs daily.     sucralfate (CARAFATE) 1 GM/10ML suspension Take 10 mLs (1 g total) by mouth 4 (four) times daily -  with meals and at bedtime. 420 mL 2   vitamin B-12 (CYANOCOBALAMIN) 500 MCG tablet Take 500 mcg by mouth daily.     No current facility-administered medications for this visit.    PHYSICAL EXAMINATION:  ECOG PERFORMANCE STATUS: 1 - Symptomatic but completely ambulatory  Vitals:   10/12/20 1411  BP: (!) 119/54  Pulse: (!) 118  Resp: 20  Temp: (!) 97.5 F (36.4 C)  SpO2: 96%    Filed Weights   10/12/20 1411  Weight: 141 lb 4.8 oz (64.1 kg)    Physical Exam Constitutional:      Appearance: Normal appearance.  HENT:     Head: Normocephalic and atraumatic.     Mouth/Throat:     Pharynx: Oropharyngeal exudate and posterior oropharyngeal erythema present.  Cardiovascular:     Rate and Rhythm: Normal rate and regular rhythm.     Pulses: Normal pulses.     Heart sounds: Normal heart sounds.  Pulmonary:     Effort: Pulmonary effort is normal.     Breath sounds: Normal breath sounds.  Abdominal:     General: Abdomen is flat. Bowel sounds are normal.     Palpations: Abdomen is soft.  Musculoskeletal:     Cervical back: Normal range of motion and neck supple.  Lymphadenopathy:     Cervical: No cervical adenopathy.  Skin:    General: Skin is warm and  dry.     Findings: Erythema (Changes consistent with ongoing radiation, no ulceration or evidence of infection,  improving since last visit) and rash present.  Neurological:     General: No focal deficit present.     Mental Status: She is alert.  Psychiatric:        Mood and Affect: Mood normal.   LABORATORY DATA:    Lab Results  Component Value Date   WBC 5.7 10/12/2020   HGB 9.5 (L) 10/12/2020   HCT 28.2 (L) 10/12/2020   MCV 97.2 10/12/2020   PLT 330 10/12/2020     Chemistry      Component Value Date/Time   NA 132 (L) 10/12/2020 1349   K 3.9 10/12/2020 1349   CL 96 (L) 10/12/2020 1349   CO2 25 10/12/2020 1349   BUN 12 10/12/2020 1349   CREATININE 0.85 10/12/2020 1349      Component Value Date/Time   CALCIUM 9.1 10/12/2020 1349   ALKPHOS 55 07/13/2020 1239   AST 14 (L) 07/13/2020 1239   ALT 8 07/13/2020 1239   BILITOT 0.3 07/13/2020 1239       In situ study for HPV 16/18 is negative. Slides will ne sent to [facility] for a broader HR HPV panel  Addendum electronically signed by Isac Caddy Askin, MD on 06/18/2020 at  5:04 PM  Addendum    The p16 immunostain is positive. HR HPV study ordered.  Addendum electronically signed by Isac Caddy Askin, MD on 06/18/2020 at  1:57 PM  Diagnosis    A: Throat, right, biopsy - Fragments of keratinizing well differentiated squamous carcinoma.  A single focus of submucosa with invasive carcinoma is present   Addendum 3    Noted 07/01/2000: Tissue was sent to [facility] for a broad HR HPV ISH study.  Results were: Negative.  The controls were appropriate    CBC from today showed resolution of leukopenia.  Normocytic normochromic anemia noted.  No thrombocytopenia.  Rest of the labs are still processing  RADIOGRAPHIC STUDIES: I have personally reviewed the radiological images as listed and agreed with the findings in the report.   No results found.    Benay Pike, MD 10/12/2020 2:43 PM

## 2020-10-12 NOTE — Progress Notes (Signed)
Contacted patient by telephone for nutrition follow up however, she did not answer. Left message with name and phone number for return call.

## 2020-10-12 NOTE — Assessment & Plan Note (Signed)
She will continue with oxycodone 10 mg twice a day as needed for pain control.  This will be refilled today.

## 2020-10-12 NOTE — Assessment & Plan Note (Signed)
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 She received 6 out of 7 planned cycles of chemotherapy.  Last treatment on September 07, 2020.  She is here for follow-up after completion of chemoradiation.  She continues to feel very tired, pain in the mouth is still very bothersome.  She is disappointed that she has not been recovering as expected.  She we have recommended that she increase her G-tube feedings as recommended, follow-up with [place] in 4 weeks for repeat labs.  We have discussed that the expected recovery time may take several months depending on the patient.  She will get her end of treatment PET/CT in later part of October and follow-up with Dr. Isidore Moos.

## 2020-10-12 NOTE — Assessment & Plan Note (Signed)
Symmetrical in bilateral lower extremities.  Less concerning for a DVT.  This is likely from hypoalbuminemia and positional. Encouraged to increase oral feeding, elevation of legs. If asymmetrical, she was encouraged to contact us asap for DVT evaluation.

## 2020-10-14 ENCOUNTER — Inpatient Hospital Stay: Payer: Medicare Other | Admitting: Hematology and Oncology

## 2020-10-14 ENCOUNTER — Inpatient Hospital Stay: Payer: Medicare Other

## 2020-10-25 ENCOUNTER — Telehealth: Payer: Self-pay | Admitting: Nutrition

## 2020-10-25 ENCOUNTER — Encounter: Payer: Medicare Other | Admitting: Nutrition

## 2020-10-25 NOTE — Telephone Encounter (Signed)
I contacted patient by telephone for nutrition follow-up.  She did not answer.  I left message with name and phone number and request for return call.

## 2020-10-27 ENCOUNTER — Telehealth: Payer: Self-pay

## 2020-10-27 ENCOUNTER — Ambulatory Visit: Payer: Medicare Other | Admitting: Radiation Oncology

## 2020-10-27 ENCOUNTER — Telehealth: Payer: Self-pay | Admitting: *Deleted

## 2020-10-27 NOTE — Telephone Encounter (Signed)
Patient missed her appointment left message asking her to call back to reschedule.

## 2020-10-27 NOTE — Telephone Encounter (Signed)
RETURNED PATIENT'S CALL TO ASK ABOUT RESCHEDULING TODAY'S APPT., PATIENT AGREED TO COME ON 10-29-20 @ 11:40 AM

## 2020-10-29 ENCOUNTER — Other Ambulatory Visit: Payer: Self-pay

## 2020-10-29 ENCOUNTER — Ambulatory Visit
Admission: RE | Admit: 2020-10-29 | Discharge: 2020-10-29 | Disposition: A | Discharge status: Home / Self Care | Payer: Medicare Other | Source: Ambulatory Visit | Attending: Radiation Oncology | Admitting: Radiation Oncology

## 2020-10-29 ENCOUNTER — Encounter: Payer: Self-pay | Admitting: Hematology and Oncology

## 2020-10-29 ENCOUNTER — Encounter: Payer: Self-pay | Admitting: Radiation Oncology

## 2020-10-29 VITALS — BP 178/89 | HR 111 | Temp 97.3°F | Resp 18 | Ht 66.0 in | Wt 139.1 lb

## 2020-10-29 DIAGNOSIS — R609 Edema, unspecified: Secondary | ICD-10-CM | POA: Insufficient documentation

## 2020-10-29 DIAGNOSIS — F17210 Nicotine dependence, cigarettes, uncomplicated: Secondary | ICD-10-CM | POA: Diagnosis not present

## 2020-10-29 DIAGNOSIS — Z79899 Other long term (current) drug therapy: Secondary | ICD-10-CM | POA: Diagnosis not present

## 2020-10-29 DIAGNOSIS — C109 Malignant neoplasm of oropharynx, unspecified: Secondary | ICD-10-CM

## 2020-10-29 DIAGNOSIS — Z7982 Long term (current) use of aspirin: Secondary | ICD-10-CM | POA: Diagnosis not present

## 2020-10-29 DIAGNOSIS — C090 Malignant neoplasm of tonsillar fossa: Secondary | ICD-10-CM | POA: Diagnosis not present

## 2020-10-29 DIAGNOSIS — Z923 Personal history of irradiation: Secondary | ICD-10-CM | POA: Diagnosis not present

## 2020-10-29 DIAGNOSIS — G4700 Insomnia, unspecified: Secondary | ICD-10-CM | POA: Insufficient documentation

## 2020-10-29 DIAGNOSIS — R5383 Other fatigue: Secondary | ICD-10-CM | POA: Diagnosis not present

## 2020-10-29 MED ORDER — ZOLPIDEM TARTRATE 5 MG PO TABS
5.0000 mg | ORAL_TABLET | Freq: Every evening | ORAL | 0 refills | Status: DC | PRN
Start: 2020-10-29 — End: 2021-02-01

## 2020-10-29 NOTE — Progress Notes (Signed)
                                                                                                                                                             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, unspecified  Cancer Staging: Cancer Staging Cancer of tonsillar fossa ([place]) Staging form: Pharynx - P16 Negative Oropharynx, AJCC 8th Edition - Clinical stage from 07/20/2020: Stage IVA (cT2, cN2c, cM0, p16-) - Signed by Eppie Gibson, MD on 07/20/2020 Stage prefix: Initial diagnosis  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   Plan: The patient will follow-up with radiation oncology in 1 month.  -----------------------------------  Eppie Gibson, MD

## 2020-10-29 NOTE — Progress Notes (Signed)
Radiation Oncology         [phone] ________________________________  Name: Cathy Knight MRN: [id number]  Date: 10/29/2020  DOB: October 03, 1950  Follow-Up Visit Note  CC: Alphia Moh, MD  Alphia Moh, MD  Diagnosis and Prior Radiotherapy:    C09.0   ICD-10-CM   1. Squamous cell carcinoma of oropharynx (HCC)  C10.9 zolpidem (AMBIEN) 5 MG tablet    2. 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  [name] presents today for follow-up after completing radiation to her right tonsil 09/23/2020   Pain issues, if any: Very minor. Reports throat pain/discomfort has improved. Using a feeding tube?: Yes--main way of receiving nutrition. Reports consuming between 6-8 cartons of supplement a day Weight changes, if any: Reports appetite comes and goes (fluctuates between wanting to eat frequently and not being hungry at all) Wt Readings from Last 3 Encounters:  10/29/20 139 lb 2 oz (63.1 kg)  10/12/20 141 lb 4.8 oz (64.1 kg)  09/13/20 149 lb 8 oz (67.8 kg)    Swallowing issues, if any: Yes, but mainly related to dry mouth which makes swallowing solid/drier  foods difficult. She has been able to resume taking all her medications by mouth (rather than via PEG) Smoking or chewing tobacco?  Still smoking about half a pack a day Using fluoride trays daily? N/A--denies any dental or oral concerns Last ENT visit was on: Not since diagnosis Other notable issues, if any: Last saw medical oncologist Dr. Chryl Heck on 10/12/2020. Dealing with lower extremity edema (was told it was related to low activity level from persistent fatigue). Reports she's not sleeping well and has tried OTC melatonin with no relief. Denies any ear or jaw pain. Skin in treatment field appears intact, but tends to remain on the drier side and flakes frequently.  Continues to use scopolamine patches for secretion management and notes that her mouth is quite dry when she tries to eat  Vitals:   10/29/20 1150  BP: (!) 178/89  Pulse: (!) 111  Resp: 18  Temp: (!) 97.3 F (36.3 C)  SpO2: 97%                       ALLERGIES:  is allergic to atorvastatin.  Meds: Current Outpatient Medications  Medication Sig Dispense Refill   zolpidem (AMBIEN) 5 MG tablet Take 1 tablet (5 mg total) by mouth at bedtime as needed for sleep. 30 tablet 0   albuterol (VENTOLIN HFA) 108 (90 Base) MCG/ACT inhaler Inhale 1-2 puffs into the lungs every 6 (six) hours as needed for wheezing or shortness of breath.     amLODipine (NORVASC) 5 MG tablet Take 5 mg  by mouth daily.     aspirin 81 MG chewable tablet Chew 81 mg by mouth daily.     buPROPion (WELLBUTRIN SR) 150 MG 12 hr tablet Start one week before quit date. Take 1 tab daily x 3 days, then 1 tab BID thereafter. (Patient taking differently: Take 150 mg by mouth daily.) 60 tablet 2   calcium citrate-vitamin D (CITRACAL+D) 315-200 MG-UNIT tablet Take 1 tablet by mouth 2 (two) times daily.     carvedilol (COREG) 6.25 MG tablet Take 6.25 mg by mouth 2 (two) times daily with a meal.     Cholecalciferol (VITAMIN D3) 125 MCG (5000 UT) CAPS Take 5,000 Units by mouth  daily.     co-enzyme Q-10 30 MG capsule Take 30 mg by mouth daily.     cyclobenzaprine (FLEXERIL) 10 MG tablet Take 10 mg by mouth 3 (three) times daily as needed for muscle spasms.     dexamethasone (DECADRON) 4 MG tablet Take 2 tablets (8 mg total) by mouth daily. Take daily x 3 days starting the day after cisplatin chemotherapy. Take with food. 30 tablet 1   diphenoxylate-atropine (LOMOTIL) 2.5-0.025 MG tablet Take 1 tablet by mouth 4 (four) times daily as needed for diarrhea or loose stools. 30 tablet 0   Evolocumab (REPATHA SURECLICK) [id number] MG/ML SOAJ Inject 140 mg into the skin every 14 (fourteen) days.     Flaxseed, Linseed, (FLAXSEED OIL) 1000 MG CAPS Take 2,000 mg by mouth daily.     fluconazole (DIFLUCAN) 10 MG/ML suspension Take 92m today, then 180mdaily for 20 more days. 220 mL 0   lidocaine (XYLOCAINE) 2 % solution Patient: Mix 1part 2% viscous lidocaine, 1part H20. Swish & swallow 1074mf diluted mixture, 12m81mefore meals and at bedtime, up to QID 200 mL 3   lidocaine-prilocaine (EMLA) cream Apply to affected area once 30 g 3   lisinopril (ZESTRIL) 20 MG tablet Take 1 tablet by mouth daily.     magnesium oxide (MAG-OX) 400 (240 Mg) MG tablet Take 1 tablet (400 mg total) by mouth 2 (two) times daily. 14 tablet 0   Multiple Vitamins-Minerals (MULTIVITAMIN WOMEN 50+ PO) Take 1 tablet by mouth daily.     nicotine (NICODERM CQ - DOSED IN MG/24 HOURS) 21 mg/24hr patch Place 21 mg onto the skin daily.     Nutritional Supplements (FEEDING SUPPLEMENT, OSMOLITE 1.5 CAL,) LIQD Osmolite 1.5 x 6 cartons/day - Give 1 1/2 cartons via PEG QID. Flush tube with 60 ml water before and after each feeding. Drink by mouth or give via tube additional 720 ml (3c.) water daily. 1422 mL 0   omeprazole (FIRST-OMEPRAZOLE) 2 mg/mL SUSP oral suspension Take 10 mLs (20 mg total) by mouth daily. 300 mL 0   ondansetron (ZOFRAN) 8 MG tablet Take 1 tablet (8 mg total) by mouth 2 (two) times daily as needed. Start on  the third day after cisplatin chemotherapy. 30 tablet 1   Oxycodone HCl 10 MG TABS Take 1 tablet (10 mg total) by mouth every 12 (twelve) hours as needed (cancer pain). 60 tablet 0   potassium chloride (KLOR-CON) 10 MEQ tablet Take 10 mEq by mouth daily.     predniSONE (DELTASONE) 20 MG tablet Take 20 mg by mouth daily as needed (COPD).     prochlorperazine (COMPAZINE) 10 MG tablet Take 1 tablet (10 mg total) by mouth every 6 (six) hours as needed (Nausea or vomiting). 30 tablet 1   scopolamine (TRANSDERM-SCOP) 1 MG/3DAYS Place 1 patch (1.5  mg total) onto the skin every 3 (three) days. 10 patch 1   sharps container Use as directed for sharps disposal     SPIRIVA RESPIMAT 2.5 MCG/ACT AERS Inhale 2 puffs into the lungs daily.     sucralfate (CARAFATE) 1 GM/10ML suspension Take 10 mLs (1 g total) by mouth 4 (four) times daily -  with meals and at bedtime. 420 mL 2   vitamin B-12 (CYANOCOBALAMIN) 500 MCG tablet Take 500 mcg by mouth daily.     No current facility-administered medications for this encounter.    Physical Findings: The patient is in no acute distress. Patient is alert and oriented. Wt Readings from Last 3 Encounters:  10/29/20 139 lb 2 oz (63.1 kg)  10/12/20 141 lb 4.8 oz (64.1 kg)  09/13/20 149 lb 8 oz (67.8 kg)    height is '5\' 6"'$  (1.676 m) and weight is 139 lb 2 oz (63.1 kg). Her temporal temperature is 97.3 F (36.3 C) (abnormal). Her blood pressure is 178/89 (abnormal) and her pulse is 111 (abnormal). Her respiration is 18 and oxygen saturation is 97%. .  General: Alert and oriented, in no acute distress HEENT: Head is normocephalic. Extraocular movements are intact. Oropharynx is notable for no visible tumor, relatively dry mouth Neck: Neck is notable for dry peeling /scaling skin and residual hyperpigmentation.  No palpable masses Skin: Skin in treatment fields is very dry and hyperpigmented with peeling Lymphatics: see Neck Exam Psychiatric: Judgment and insight are  intact. Affect is appropriate.   Lab Findings: Lab Results  Component Value Date   WBC 5.7 10/12/2020   HGB 9.5 (L) 10/12/2020   HCT 28.2 (L) 10/12/2020   MCV 97.2 10/12/2020   PLT 330 10/12/2020    Lab Results  Component Value Date   TSH 0.551 07/23/2020    Radiographic Findings: No results found.  Impression/Plan:    1) Head and Neck Cancer Status: Healing from radiation and chemo therapy with significant fatigue  2) Nutritional Status: Continue to use PEG as needed and escalate oral intake as tolerated.  I suggested she try to wean off of her scopolamine patch and see if this helps with her dry mouth which is making it difficult to eat food.  Also suggested that she use Biotene products and sips of water as she eats.  PEG tube: As above  The patient has been lost to follow-up with nutrition and our navigator is aware  3) Risk Factors: The patient has been educated about risk factors including alcohol and tobacco abuse; they understand that avoidance of alcohol and tobacco is important to prevent recurrences as well as other cancers  She is still smoking.  She has not yet motivated to quit.  We talked about the dangers of continuing to smoke given her diagnosis  4) Swallowing: She was lost to follow-up with swallowing therapy and our navigator is aware  5) Dental: Encouraged to continue regular followup with dentistry, and dental hygiene including fluoride rinses.   6) Thyroid function: Check annually Lab Results  Component Value Date   TSH 0.551 07/23/2020    7) Other: Fatigue and severe insomnia.  Melatonin has not helped.  I gave her prescription today for Ambien to take as needed.  She understands this can be habit-forming  Urged her to use moisturizer 2-3 times a day over her neck and upper torso for dry skin in the treatment fields  8) Follow-up in early November with restaging PET. The patient was encouraged to call  with any issues or questions before  then.  On date of service, in total, I spent 30 minutes on this encounter. Patient was seen in person. _____________________________________   Eppie Gibson, MD

## 2020-10-29 NOTE — Progress Notes (Signed)
Oncology Nurse Navigator Documentation   I met with Cathy Knight before and during her follow up appointment with Dr. Isidore Moos today. She is recovering slowly from her recent treatment. She reports fatigue and having difficulty with dry mouth. I discussed getting scheduled with Garald Balding SLP for needed follow up after her radiation and she has agreed to call me next week to get something set up. I also provided her with the information for [employer] to order more supplies for her PEG tube. She has my direct contact information to call me when she is able or if she has further questions.   Cathy Asa RN, BSN, OCN Head & Neck Oncology Nurse [place] at [facility] Phone # [phone]  Fax # [phone]

## 2020-10-29 NOTE — Progress Notes (Signed)
Cathy Knight presents today for follow-up after completing radiation to her right tonsil 09/23/2020   Pain issues, if any: Very minor. Reports throat pain/discomfort has improved. Using a feeding tube?: Yes--main way of receiving nutrition. Reports consuming between 6-8 cartons of supplement a day Weight changes, if any: Reports appetite comes and goes (fluctuates between wanting to eat frequently and not being hungry at all) Wt Readings from Last 3 Encounters:  10/29/20 139 lb 2 oz (63.1 kg)  10/12/20 141 lb 4.8 oz (64.1 kg)  09/13/20 149 lb 8 oz (67.8 kg)    Swallowing issues, if any: Yes, but mainly related to dry mouth which makes swallowing solid/drier foods difficult. She has been able to resume taking all her medications by mouth (rather than via PEG) Smoking or chewing tobacco? None Using fluoride trays daily? N/A--denies any dental or oral concerns Last ENT visit was on: Not since diagnosis Other notable issues, if any: Last saw medical oncologist Dr. Chryl Heck on 10/12/2020. Dealing with lower extremity edema (was told it was related to low activity level from persistent fatigue). Reports she's not sleeping well and has tried OTC melatonin with no relief. Denies any ear or jaw pain. Skin in treatment field appears intact, but tends to remain on the drier side and flakes frequently. Appears to have mild swelling/lymphedema under chin. Continues to use scopolamine patches for secretion management   Vitals:   10/29/20 1150  BP: (!) 178/89  Pulse: (!) 111  Resp: 18  Temp: (!) 97.3 F (36.3 C)  SpO2: 97%

## 2020-11-04 ENCOUNTER — Telehealth: Payer: Self-pay | Admitting: Nutrition

## 2020-11-04 ENCOUNTER — Ambulatory Visit: Payer: Medicare Other | Admitting: Nutrition

## 2020-11-04 NOTE — Telephone Encounter (Signed)
Nutrition follow-up by telephone was completed with patient who completed treatment for cancer of the oropharynx. Last weight documented was 139 pounds September 2, decreased from 143 pounds July 26. Patient reports that she was pretty sick after treatment completed and had increased nausea and vomiting. States she is not very patient and really wants to begin to eat so that she can have her feeding tube removed.  Patient states she is drinking 64 ounces of water daily.  She is tolerating 2-3 different types of soup daily.  She has also tolerated scrambled eggs, baked potatoes, and oatmeal. She is using 6-7 cartons Osmolite 1.5 via PEG daily.  She continues to adjust her feeding tube to avoid leakage.  She plans on ordering additional tube feeding today.  She verbalizes the need to promote weight gain before she decreases tube feeding.  Estimated nutrition needs: 2070-2215 cal, 110-125 g protein, 2.2 L fluid.  Nutrition diagnosis: Inadequate oral intake continues.  Intervention: Continue Osmolite 1.5 6-7 cartons daily via PEG split into 4 feedings with 60 mL free water before and after boluses. Educated on strategies for increasing oral intake by adding extra liquids/gravies to very soft pured foods.  Provided multiple examples which patient verbalized she was excited to try. Congratulated patient for her efforts and encouraged her to contact me for any questions.  Monitoring, evaluation, goals: Patient will continue to work to increase oral intake so tube feeding can be weaned and discontinued.  Next visit: Telephone call in 3-4 weeks.  **Disclaimer: This note was dictated with voice recognition software. Similar sounding words can inadvertently be transcribed and this note may contain transcription errors which may not have been corrected upon publication of note.**

## 2020-11-04 NOTE — Progress Notes (Signed)
See telephone note.

## 2020-11-05 NOTE — Unmapped (Signed)
outside rad onc note  Received: 11/05/2020 12:22 PM  Pat Kocher, RN  Noe Gens, MD; Neal Dy, MD  A 10/29/20 clinic note from [facility] is scanned into [employer].

## 2020-11-09 ENCOUNTER — Inpatient Hospital Stay: Payer: Medicare Other

## 2020-11-09 ENCOUNTER — Inpatient Hospital Stay: Payer: Medicare Other | Attending: Hematology and Oncology | Admitting: Hematology and Oncology

## 2020-11-09 ENCOUNTER — Encounter: Payer: Self-pay | Admitting: Hematology and Oncology

## 2020-11-09 ENCOUNTER — Other Ambulatory Visit: Payer: Self-pay

## 2020-11-09 DIAGNOSIS — Z923 Personal history of irradiation: Secondary | ICD-10-CM | POA: Diagnosis not present

## 2020-11-09 DIAGNOSIS — R410 Disorientation, unspecified: Secondary | ICD-10-CM | POA: Diagnosis not present

## 2020-11-09 DIAGNOSIS — T451X5A Adverse effect of antineoplastic and immunosuppressive drugs, initial encounter: Secondary | ICD-10-CM | POA: Diagnosis not present

## 2020-11-09 DIAGNOSIS — C109 Malignant neoplasm of oropharynx, unspecified: Secondary | ICD-10-CM

## 2020-11-09 DIAGNOSIS — Z79899 Other long term (current) drug therapy: Secondary | ICD-10-CM | POA: Diagnosis not present

## 2020-11-09 DIAGNOSIS — C090 Malignant neoplasm of tonsillar fossa: Secondary | ICD-10-CM | POA: Diagnosis not present

## 2020-11-09 DIAGNOSIS — R609 Edema, unspecified: Secondary | ICD-10-CM | POA: Insufficient documentation

## 2020-11-09 DIAGNOSIS — M7989 Other specified soft tissue disorders: Secondary | ICD-10-CM

## 2020-11-09 DIAGNOSIS — R634 Abnormal weight loss: Secondary | ICD-10-CM

## 2020-11-09 DIAGNOSIS — Z931 Gastrostomy status: Secondary | ICD-10-CM | POA: Insufficient documentation

## 2020-11-09 DIAGNOSIS — K1231 Oral mucositis (ulcerative) due to antineoplastic therapy: Secondary | ICD-10-CM | POA: Diagnosis not present

## 2020-11-09 DIAGNOSIS — D759 Disease of blood and blood-forming organs, unspecified: Secondary | ICD-10-CM | POA: Diagnosis not present

## 2020-11-09 DIAGNOSIS — F17210 Nicotine dependence, cigarettes, uncomplicated: Secondary | ICD-10-CM | POA: Diagnosis not present

## 2020-11-09 DIAGNOSIS — G893 Neoplasm related pain (acute) (chronic): Secondary | ICD-10-CM | POA: Diagnosis not present

## 2020-11-09 LAB — CBC WITH DIFFERENTIAL (CANCER CENTER ONLY)
Abs Immature Granulocytes: 0.04 10*3/uL (ref 0.00–0.07)
Basophils Absolute: 0 10*3/uL (ref 0.0–0.1)
Basophils Relative: 0 %
Eosinophils Absolute: 0.1 10*3/uL (ref 0.0–0.5)
Eosinophils Relative: 1 %
HCT: 34.6 % — ABNORMAL LOW (ref 36.0–46.0)
Hemoglobin: 11.8 g/dL — ABNORMAL LOW (ref 12.0–15.0)
Immature Granulocytes: 1 %
Lymphocytes Relative: 20 %
Lymphs Abs: 1.6 10*3/uL (ref 0.7–4.0)
MCH: 34.6 pg — ABNORMAL HIGH (ref 26.0–34.0)
MCHC: 34.1 g/dL (ref 30.0–36.0)
MCV: 101.5 fL — ABNORMAL HIGH (ref 80.0–100.0)
Monocytes Absolute: 0.7 10*3/uL (ref 0.1–1.0)
Monocytes Relative: 9 %
Neutro Abs: 5.5 10*3/uL (ref 1.7–7.7)
Neutrophils Relative %: 69 %
Platelet Count: 264 10*3/uL (ref 150–400)
RBC: 3.41 MIL/uL — ABNORMAL LOW (ref 3.87–5.11)
RDW: 16.5 % — ABNORMAL HIGH (ref 11.5–15.5)
WBC Count: 8 10*3/uL (ref 4.0–10.5)
nRBC: 0 % (ref 0.0–0.2)

## 2020-11-09 LAB — BASIC METABOLIC PANEL - CANCER CENTER ONLY
Anion gap: 13 (ref 5–15)
BUN: 9 mg/dL (ref 8–23)
CO2: 28 mmol/L (ref 22–32)
Calcium: 9.8 mg/dL (ref 8.9–10.3)
Chloride: 95 mmol/L — ABNORMAL LOW (ref 98–111)
Creatinine: 0.95 mg/dL (ref 0.44–1.00)
GFR, Estimated: >60 mL/min (ref >60)
Glucose, Bld: 103 mg/dL — ABNORMAL HIGH (ref 70–99)
Potassium: 3.8 mmol/L (ref 3.5–5.1)
Sodium: 136 mmol/L (ref 135–145)

## 2020-11-09 LAB — MAGNESIUM: Magnesium: 1.3 mg/dL — ABNORMAL LOW (ref 1.7–2.4)

## 2020-11-09 MED ORDER — OXYCODONE HCL 10 MG PO TABS: 10.0000 mg | ORAL_TABLET | Freq: Two times a day (BID) | ORAL | 0 refills | Status: DC | PRN

## 2020-11-09 NOTE — Progress Notes (Signed)
[place] CONSULT NOTE  Patient Care Team: Alphia Moh, MD as PCP - General (Family Medicine) Malmfelt, Stephani Police, RN as Oncology Nurse Navigator Charolett Bumpers, Mike Craze, MD as Referring Physician (Otolaryngology) Eppie Gibson, MD as Consulting Physician (Radiation Oncology) Benay Pike, MD as Consulting Physician (Hematology and Oncology)  CHIEF COMPLAINTS/PURPOSE OF CONSULTATION:  Tonsillar cancer follow up before week 7 of cisplatin  ASSESSMENT & PLAN:   Cancer of tonsillar fossa [facility]) This is a very pleasant 70 year old female patient with a newly diagnosed squamous cell carcinoma of the oropharynx, T2N2cM0, 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 She received 6 out of 7 planned cycles of chemotherapy.  Last treatment on September 07, 2020.  She is here for follow-up after completion of chemoradiation. End of treatment PET planned in October followed by appointment with Dr. Isidore Moos to review imaging. Review of systems today pertinent for ongoing sore throat, difficulty swallowing.  She continues to use G-tube as the main source of nourishment. She is still been using some oxycodone once or twice a day to be able to eat and swallow.  She would like 1 last refill.  At this time there is no concern for disease recurrence.  She will follow-up with Dr. Isidore Moos after her PET/CT and return to clinic for follow-up with me in 3 months approximately.  She was encouraged to contact our nurse navigator with any interim questions.  Swelling of lower extremity Swelling improved but still persist.  Ordered duplex for both lower extremities as well as echo.  Her last creatinine was normal hence unlikely related to kidney disease.   We will follow-up on the results.  Weight loss Weight loss of about 3 pounds since last visit.  Encouraged use of the G-tube and oral feeding as tolerated.  We will continue to monitor her weight.  Mucositis due to  antineoplastic therapy She would like 1 more refill of oxycodone for management of mucositis related pain.  This will be her last refill.  Orders Placed This Encounter  Procedures   ECHOCARDIOGRAM COMPLETE    Standing Status:   Future    Standing Expiration Date:   11/09/2021    Order Specific Question:   Where should this test be performed    Answer:   [place]    Order Specific Question:   Perflutren DEFINITY (image enhancing agent) should be administered unless hypersensitivity or allergy exist    Answer:   Administer Perflutren    Order Specific Question:   Is a special reader required? (athlete or structural heart)    Answer:   No    Order Specific Question:   Reason for exam-Echo    Answer:   Other-Full Diagnosis List    Order Specific Question:   Full ICD-10/Reason for Exam    Answer:   Swelling of both lower extremities [id number]     HISTORY OF PRESENTING ILLNESS:   Cathy Knight 70 y.o. female is here because of SCC oropharynx, P 16 +  Chronology  This is a very pleasant 70 year old female patient with newly diagnosed oropharyngeal cancer referred to medical oncology for recommendations.  During her last visit we discussed about concurrent chemoradiation given bilateral disease. She completed radiation on September 23, 2020. Cycle 6 of chemotherapy completed on September 07, 2020.  Cycle 7 of chemotherapy omitted because of worsening performance status and poor tolerance.  Interval History  She is here for follow-up by herself, she started smoking again, smoking about  1 pack/day. She still uses G tube for about 3 feedings a day. Started eating, but its still uncomfortable to swallow. She has been using oxycodone once to twice a day to be able to eat. She has been able to eat liquids, more like soups. She hasnt tried solids yet. Drinking water all day long. No tingling, numbness or neuropathy BLE improving, but still swollen. No change in bowel habits, urinary habits No new  neurological complaints.  MEDICAL HISTORY:  Past Medical History:  Diagnosis Date   Cancer ([place])    Hypertension     SURGICAL HISTORY: Past Surgical History:  Procedure Laterality Date   IR CM INJ ANY COLONIC TUBE W/FLUORO  08/09/2020   IR GASTROSTOMY TUBE MOD SED  07/28/2020   IR IMAGING GUIDED PORT INSERTION  07/28/2020    SOCIAL HISTORY: Social History   Socioeconomic History   Marital status: Single    Spouse name: Not on file   Number of children: Not on file   Years of education: Not on file   Highest education level: Not on file  Occupational History   Not on file  Tobacco Use   Smoking status: Every Day    Packs/day: 1.00    Years: 15.00    Pack years: 15.00    Types: Cigarettes   Smokeless tobacco: Never  Substance and Sexual Activity   Alcohol use: Not on file   Drug use: Not on file   Sexual activity: Not on file  Other Topics Concern   Not on file  Social History Narrative   Not on file   Social Determinants of Health   Financial Resource Strain: Low Risk    Difficulty of Paying Living Expenses: Not very hard  Food Insecurity: No Food Insecurity   Worried About Running Out of Food in the Last Year: Never true   Ran Out of Food in the Last Year: Never true  Transportation Needs: No Transportation Needs   Lack of Transportation (Medical): No   Lack of Transportation (Non-Medical): No  Physical Activity: Not on file  Stress: Not on file  Social Connections: Not on file  Intimate Partner Violence: Not At Risk   Fear of Current or Ex-Partner: No   Emotionally Abused: No   Physically Abused: No   Sexually Abused: No    FAMILY HISTORY: No family history on file.  ALLERGIES:  is allergic to atorvastatin.  MEDICATIONS:  Current Outpatient Medications  Medication Sig Dispense Refill   albuterol (VENTOLIN HFA) 108 (90 Base) MCG/ACT inhaler Inhale 1-2 puffs into the lungs every 6 (six) hours as needed for wheezing or shortness of breath.      amLODipine (NORVASC) 5 MG tablet Take 5 mg by mouth daily.     aspirin 81 MG chewable tablet Chew 81 mg by mouth daily.     buPROPion (WELLBUTRIN SR) 150 MG 12 hr tablet Start one week before quit date. Take 1 tab daily x 3 days, then 1 tab BID thereafter. (Patient taking differently: Take 150 mg by mouth daily.) 60 tablet 2   calcium citrate-vitamin D (CITRACAL+D) 315-200 MG-UNIT tablet Take 1 tablet by mouth 2 (two) times daily.     carvedilol (COREG) 6.25 MG tablet Take 6.25 mg by mouth 2 (two) times daily with a meal.     Cholecalciferol (VITAMIN D3) 125 MCG (5000 UT) CAPS Take 5,000 Units by mouth daily.     co-enzyme Q-10 30 MG capsule Take 30 mg by mouth daily.  cyclobenzaprine (FLEXERIL) 10 MG tablet Take 10 mg by mouth 3 (three) times daily as needed for muscle spasms.     diphenoxylate-atropine (LOMOTIL) 2.5-0.025 MG tablet Take 1 tablet by mouth 4 (four) times daily as needed for diarrhea or loose stools. 30 tablet 0   Evolocumab (REPATHA SURECLICK) [id number] MG/ML SOAJ Inject 140 mg into the skin every 14 (fourteen) days.     Flaxseed, Linseed, (FLAXSEED OIL) 1000 MG CAPS Take 2,000 mg by mouth daily.     fluconazole (DIFLUCAN) 10 MG/ML suspension Take 35m today, then 168mdaily for 20 more days. 220 mL 0   lidocaine (XYLOCAINE) 2 % solution Patient: Mix 1part 2% viscous lidocaine, 1part H20. Swish & swallow 107mf diluted mixture, 53m42mefore meals and at bedtime, up to QID 200 mL 3   lidocaine-prilocaine (EMLA) cream Apply to affected area once 30 g 3   lisinopril (ZESTRIL) 20 MG tablet Take 1 tablet by mouth daily.     magnesium oxide (MAG-OX) 400 (240 Mg) MG tablet Take 1 tablet (400 mg total) by mouth 2 (two) times daily. 14 tablet 0   Multiple Vitamins-Minerals (MULTIVITAMIN WOMEN 50+ PO) Take 1 tablet by mouth daily.     nicotine (NICODERM CQ - DOSED IN MG/24 HOURS) 21 mg/24hr patch Place 21 mg onto the skin daily.     Nutritional Supplements (FEEDING SUPPLEMENT, OSMOLITE 1.5  CAL,) LIQD Osmolite 1.5 x 6 cartons/day - Give 1 1/2 cartons via PEG QID. Flush tube with 60 ml water before and after each feeding. Drink by mouth or give via tube additional 720 ml (3c.) water daily. 1422 mL 0   omeprazole (FIRST-OMEPRAZOLE) 2 mg/mL SUSP oral suspension Take 10 mLs (20 mg total) by mouth daily. 300 mL 0   potassium chloride (KLOR-CON) 10 MEQ tablet Take 10 mEq by mouth daily.     predniSONE (DELTASONE) 20 MG tablet Take 20 mg by mouth daily as needed (COPD).     scopolamine (TRANSDERM-SCOP) 1 MG/3DAYS Place 1 patch (1.5 mg total) onto the skin every 3 (three) days. 10 patch 1   sharps container Use as directed for sharps disposal     SPIRIVA RESPIMAT 2.5 MCG/ACT AERS Inhale 2 puffs into the lungs daily.     sucralfate (CARAFATE) 1 GM/10ML suspension Take 10 mLs (1 g total) by mouth 4 (four) times daily -  with meals and at bedtime. 420 mL 2   vitamin B-12 (CYANOCOBALAMIN) 500 MCG tablet Take 500 mcg by mouth daily.     zolpidem (AMBIEN) 5 MG tablet Take 1 tablet (5 mg total) by mouth at bedtime as needed for sleep. 30 tablet 0   dexamethasone (DECADRON) 4 MG tablet Take 2 tablets (8 mg total) by mouth daily. Take daily x 3 days starting the day after cisplatin chemotherapy. Take with food. (Patient not taking: Reported on 11/09/2020) 30 tablet 1   ondansetron (ZOFRAN) 8 MG tablet Take 1 tablet (8 mg total) by mouth 2 (two) times daily as needed. Start on the third day after cisplatin chemotherapy. (Patient not taking: Reported on 11/09/2020) 30 tablet 1   Oxycodone HCl 10 MG TABS Take 1 tablet (10 mg total) by mouth every 12 (twelve) hours as needed (cancer pain). 60 tablet 0   prochlorperazine (COMPAZINE) 10 MG tablet Take 1 tablet (10 mg total) by mouth every 6 (six) hours as needed (Nausea or vomiting). (Patient not taking: Reported on 11/09/2020) 30 tablet 1   No current facility-administered medications for this visit.  PHYSICAL EXAMINATION:  ECOG PERFORMANCE STATUS: 1 -  Symptomatic but completely ambulatory  Vitals:   11/09/20 1425 11/09/20 1427  BP: (!) 103/59   Pulse: (!) 117 100  Resp: 18   Temp: 97.8 F (36.6 C)   SpO2: 95%     Filed Weights   11/09/20 1425  Weight: 137 lb 1.6 oz (62.2 kg)    Physical Exam Constitutional:      Appearance: Normal appearance.  HENT:     Head: Normocephalic and atraumatic.     Mouth/Throat:     Pharynx: Posterior oropharyngeal erythema (Ongoing mild mucositis.) present. No oropharyngeal exudate.  Cardiovascular:     Rate and Rhythm: Normal rate and regular rhythm.     Pulses: Normal pulses.     Heart sounds: Normal heart sounds.  Pulmonary:     Effort: Pulmonary effort is normal.     Breath sounds: Normal breath sounds.  Abdominal:     General: Abdomen is flat. Bowel sounds are normal.     Palpations: Abdomen is soft.  Musculoskeletal:        General: Swelling (BLE swelling) present. No tenderness.     Cervical back: Normal range of motion and neck supple.  Lymphadenopathy:     Cervical: No cervical adenopathy.  Skin:    General: Skin is warm and dry.     Findings: No erythema or rash (Postradiation hyperpigmentation).  Neurological:     General: No focal deficit present.     Mental Status: She is alert.  Psychiatric:        Mood and Affect: Mood normal.   LABORATORY DATA:    Lab Results  Component Value Date   WBC 8.0 11/09/2020   HGB 11.8 (L) 11/09/2020   HCT 34.6 (L) 11/09/2020   MCV 101.5 (H) 11/09/2020   PLT 264 11/09/2020     Chemistry      Component Value Date/Time   NA 132 (L) 10/12/2020 1349   K 3.9 10/12/2020 1349   CL 96 (L) 10/12/2020 1349   CO2 25 10/12/2020 1349   BUN 12 10/12/2020 1349   CREATININE 0.85 10/12/2020 1349      Component Value Date/Time   CALCIUM 9.1 10/12/2020 1349   ALKPHOS 55 07/13/2020 1239   AST 14 (L) 07/13/2020 1239   ALT 8 07/13/2020 1239   BILITOT 0.3 07/13/2020 1239       In situ study for HPV 16/18 is negative. Slides will ne sent  to [facility] for a broader HR HPV panel  Addendum electronically signed by Isac Caddy Askin, MD on 06/18/2020 at  5:04 PM  Addendum    The p16 immunostain is positive. HR HPV study ordered.  Addendum electronically signed by Isac Caddy Askin, MD on 06/18/2020 at  1:57 PM  Diagnosis    A: Throat, right, biopsy - Fragments of keratinizing well differentiated squamous carcinoma.  A single focus of submucosa with invasive carcinoma is present   Addendum 3    Noted 07/01/2000: Tissue was sent to [facility] for a broad HR HPV ISH study.  Results were: Negative.  The controls were appropriate    CBC from today showed improvement in cytopenia.  RADIOGRAPHIC STUDIES: I have personally reviewed the radiological images as listed and agreed with the findings in the report.   No results found.    Benay Pike, MD 11/09/2020 2:51 PM

## 2020-11-09 NOTE — Assessment & Plan Note (Signed)
Weight loss of about 3 pounds since last visit.  Encouraged use of the G-tube and oral feeding as tolerated.  We will continue to monitor her weight.

## 2020-11-09 NOTE — Assessment & Plan Note (Addendum)
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 She received 6 out of 7 planned cycles of chemotherapy.  Last treatment on September 07, 2020.  She is here for follow-up after completion of chemoradiation. End of treatment PET planned in October followed by appointment with Dr. Isidore Moos to review imaging. Review of systems today pertinent for ongoing sore throat, difficulty swallowing.  She continues to use G-tube as the main source of nourishment. She is still been using some oxycodone once or twice a day to be able to eat and swallow.  She would like 1 last refill.  At this time there is no concern for disease recurrence.  She will follow-up with Dr. Isidore Moos after her PET/CT and return to clinic for follow-up with me in 3 months approximately.  She was encouraged to contact our nurse navigator with any interim questions.

## 2020-11-09 NOTE — Assessment & Plan Note (Signed)
She would like 1 more refill of oxycodone for management of mucositis related pain.  This will be her last refill.

## 2020-11-09 NOTE — Assessment & Plan Note (Signed)
Swelling improved but still persist.  Ordered duplex for both lower extremities as well as echo.  Her last creatinine was normal hence unlikely related to kidney disease.   We will follow-up on the results.

## 2020-11-10 ENCOUNTER — Ambulatory Visit (HOSPITAL_COMMUNITY): Payer: Medicare Other | Attending: Hematology and Oncology

## 2020-11-10 ENCOUNTER — Telehealth: Payer: Self-pay

## 2020-11-10 ENCOUNTER — Other Ambulatory Visit: Payer: Self-pay

## 2020-11-10 DIAGNOSIS — C090 Malignant neoplasm of tonsillar fossa: Secondary | ICD-10-CM

## 2020-11-10 NOTE — Telephone Encounter (Signed)
-----   Message from Benay Pike, MD sent at 11/10/2020  2:20 PM EDT ----- Cathy Knight  Can we arrange for 2 gms Mg IV infusion outpatient in the next couple days.  Thanks,

## 2020-11-10 NOTE — Telephone Encounter (Signed)
Patient has been made aware of lab results and will be coming in on 11/13/20 to receive IV Mag. I also rescheduled the patient's Vascular US for tomorrow, patient is aware of appointments and verbalized understanding.

## 2020-11-10 NOTE — Progress Notes (Signed)
2 gms of Mag to be given on 11/13/20, orders placed under sign and held for RN to release.

## 2020-11-11 ENCOUNTER — Telehealth: Payer: Self-pay

## 2020-11-11 ENCOUNTER — Ambulatory Visit (HOSPITAL_COMMUNITY)
Admission: RE | Admit: 2020-11-11 | Discharge: 2020-11-11 | Disposition: A | Discharge status: Home / Self Care | Payer: Medicare Other | Source: Ambulatory Visit | Attending: Hematology and Oncology | Admitting: Hematology and Oncology

## 2020-11-11 ENCOUNTER — Other Ambulatory Visit: Payer: Self-pay | Admitting: Hematology and Oncology

## 2020-11-11 ENCOUNTER — Other Ambulatory Visit: Payer: Self-pay

## 2020-11-11 DIAGNOSIS — I70209 Unspecified atherosclerosis of native arteries of extremities, unspecified extremity: Secondary | ICD-10-CM

## 2020-11-11 DIAGNOSIS — M7989 Other specified soft tissue disorders: Secondary | ICD-10-CM

## 2020-11-11 DIAGNOSIS — C090 Malignant neoplasm of tonsillar fossa: Secondary | ICD-10-CM | POA: Diagnosis not present

## 2020-11-11 NOTE — Telephone Encounter (Signed)
Consult to vascular surgery faxed per Dr Rob Hickman request.

## 2020-11-11 NOTE — Progress Notes (Signed)
Mb ref va

## 2020-11-11 NOTE — Progress Notes (Signed)
Bilateral lower extremity venous duplex has been completed. Preliminary results can be found in CV Proc through chart review.  Results were given to [facility] at Dr. Rob Hickman office.  11/11/20 1:58 PM Carlos Levering RVT

## 2020-11-13 ENCOUNTER — Inpatient Hospital Stay: Payer: Medicare Other

## 2020-11-23 ENCOUNTER — Other Ambulatory Visit (HOSPITAL_COMMUNITY): Payer: Medicare Other

## 2020-11-24 ENCOUNTER — Telehealth: Payer: Self-pay | Admitting: Hematology and Oncology

## 2020-11-24 NOTE — Telephone Encounter (Signed)
Scheduled per 09/27 scheduled message, patient has been called and notified.

## 2020-11-25 ENCOUNTER — Inpatient Hospital Stay: Payer: Medicare Other

## 2020-11-25 ENCOUNTER — Inpatient Hospital Stay (HOSPITAL_BASED_OUTPATIENT_CLINIC_OR_DEPARTMENT_OTHER): Payer: Medicare Other | Admitting: Hematology and Oncology

## 2020-11-25 ENCOUNTER — Other Ambulatory Visit: Payer: Self-pay

## 2020-11-25 DIAGNOSIS — C109 Malignant neoplasm of oropharynx, unspecified: Secondary | ICD-10-CM | POA: Diagnosis not present

## 2020-11-25 DIAGNOSIS — G893 Neoplasm related pain (acute) (chronic): Secondary | ICD-10-CM | POA: Diagnosis not present

## 2020-11-25 DIAGNOSIS — C090 Malignant neoplasm of tonsillar fossa: Secondary | ICD-10-CM | POA: Diagnosis not present

## 2020-11-25 DIAGNOSIS — Z931 Gastrostomy status: Secondary | ICD-10-CM | POA: Diagnosis not present

## 2020-11-25 LAB — CBC WITH DIFFERENTIAL (CANCER CENTER ONLY)
Abs Immature Granulocytes: 0.05 10*3/uL (ref 0.00–0.07)
Basophils Absolute: 0 10*3/uL (ref 0.0–0.1)
Basophils Relative: 0 %
Eosinophils Absolute: 0.1 10*3/uL (ref 0.0–0.5)
Eosinophils Relative: 1 %
HCT: 35.8 % — ABNORMAL LOW (ref 36.0–46.0)
Hemoglobin: 12.2 g/dL (ref 12.0–15.0)
Immature Granulocytes: 1 %
Lymphocytes Relative: 12 %
Lymphs Abs: 1 10*3/uL (ref 0.7–4.0)
MCH: 35 pg — ABNORMAL HIGH (ref 26.0–34.0)
MCHC: 34.1 g/dL (ref 30.0–36.0)
MCV: 102.6 fL — ABNORMAL HIGH (ref 80.0–100.0)
Monocytes Absolute: 0.6 10*3/uL (ref 0.1–1.0)
Monocytes Relative: 7 %
Neutro Abs: 6.2 10*3/uL (ref 1.7–7.7)
Neutrophils Relative %: 79 %
Platelet Count: 260 10*3/uL (ref 150–400)
RBC: 3.49 MIL/uL — ABNORMAL LOW (ref 3.87–5.11)
RDW: 14.5 % (ref 11.5–15.5)
WBC Count: 7.9 10*3/uL (ref 4.0–10.5)
nRBC: 0 % (ref 0.0–0.2)

## 2020-11-25 LAB — BASIC METABOLIC PANEL - CANCER CENTER ONLY
Anion gap: 12 (ref 5–15)
BUN: 13 mg/dL (ref 8–23)
CO2: 25 mmol/L (ref 22–32)
Calcium: 9.2 mg/dL (ref 8.9–10.3)
Chloride: 101 mmol/L (ref 98–111)
Creatinine: 0.83 mg/dL (ref 0.44–1.00)
GFR, Estimated: >60 mL/min (ref >60)
Glucose, Bld: 100 mg/dL — ABNORMAL HIGH (ref 70–99)
Potassium: 3.8 mmol/L (ref 3.5–5.1)
Sodium: 138 mmol/L (ref 135–145)

## 2020-11-25 LAB — MAGNESIUM: Magnesium: 2 mg/dL (ref 1.7–2.4)

## 2020-11-26 ENCOUNTER — Encounter: Payer: Self-pay | Admitting: Hematology and Oncology

## 2020-11-26 NOTE — Progress Notes (Signed)
[place] NOTE  Patient Care Team: Alphia Moh, MD as PCP - General (Family Medicine) Malmfelt, Stephani Police, RN as Oncology Nurse Navigator Tyler Pita, MD as Referring Physician (Otolaryngology) Eppie Gibson, MD as Consulting Physician (Radiation Oncology) Benay Pike, MD as Consulting Physician (Hematology and Oncology)  CHIEF COMPLAINTS/PURPOSE OF CONSULTATION:  Tonsillar cancer follow up after completion of chemoradiation.  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 She received 6 out of 7 planned cycles of chemotherapy.  Last treatment on September 07, 2020.  She is here for follow-up after completion of chemoradiation. End of treatment PET planned in October followed by appointment with Dr. Isidore Moos to review imaging. Review of systems today pertinent for ongoing issues with tolerating oral diet as well as some sore throat, recent episodes of confusion. She stopped using oxycodone. She believes this might have contributed to some of her confusion. PE today, mild erythema, improving BLE swelling, otherwise appears well She will follow up with speech therapy to assist her with restarting oral feeding.  No more pain medicaiton, can use tylenol as needed. BLE swelling significantly improved, encouraged her to follow up with vascular surgery to assist with management of superficial femoral artery occlusion. She expressed understanding of the recommendations. She will follow up with Dr Isidore Moos after her PET CT RTC with [place] in about 6 months  Cancer related pain Better controlled. No more narcotics She can use tylenol as needed.  G tube feedings [facility]) She will follow up with speech therapy so she can re initiate oral feedings She  continues on G tube feedings for now. She can eat some soup. She will be followed by speech therapy and Dr Isidore Moos. She was encouraged to call us with any ongoing issues.  No orders of the defined types were placed in this encounter.    HISTORY OF PRESENTING ILLNESS:   Cathy Knight 70 y.o. female is here because of SCC oropharynx, P 16 +  Chronology  This is a very pleasant 70 year old female patient with newly diagnosed oropharyngeal cancer referred to medical oncology for recommendations.  During her last visit we discussed about concurrent chemoradiation given bilateral disease. She completed radiation on September 23, 2020. Cycle 6 of chemotherapy completed on September 07, 2020.  Cycle 7 of chemotherapy omitted because of worsening performance status and poor tolerance.  Interval History  She is here for follow-up by herself, Our NN here as well. We got a couple messages from her room mate that she was delusional and not nourishing well Today she appears completely well. She says it could be the pain medication that made her delusional. She is using G tube feeding, about 4.5 cans a day She is able to eat some soup. Hasnt followed up with speech therapy yet. Continues to smoke heavily. No nausea or vomiting Sorethroat is more tolerable, she has stopped taking all pain medication. No hearing loss No neuropathy   MEDICAL HISTORY:  Past Medical History:  Diagnosis Date   Cancer ([place])    Hypertension     SURGICAL HISTORY: Past Surgical History:  Procedure Laterality Date   IR CM INJ ANY COLONIC TUBE W/FLUORO  08/09/2020   IR GASTROSTOMY TUBE MOD SED  07/28/2020   IR IMAGING GUIDED PORT INSERTION  07/28/2020    SOCIAL  HISTORY: Social History   Socioeconomic History   Marital status: Single    Spouse name: Not on file   Number of children: Not on file   Years of education: Not on file   Highest education level: Not on file  Occupational History   Not on file  Tobacco  Use   Smoking status: Every Day    Packs/day: 1.00    Years: 15.00    Pack years: 15.00    Types: Cigarettes   Smokeless tobacco: Never  Substance and Sexual Activity   Alcohol use: Not on file   Drug use: Not on file   Sexual activity: Not on file  Other Topics Concern   Not on file  Social History Narrative   Not on file   Social Determinants of Health   Financial Resource Strain: Low Risk    Difficulty of Paying Living Expenses: Not very hard  Food Insecurity: No Food Insecurity   Worried About Running Out of Food in the Last Year: Never true   [place] in the Last Year: Never true  Transportation Needs: No Transportation Needs   Lack of Transportation (Medical): No   Lack of Transportation (Non-Medical): No  Physical Activity: Not on file  Stress: Not on file  Social Connections: Not on file  Intimate Partner Violence: Not At Risk   Fear of Current or Ex-Partner: No   Emotionally Abused: No   Physically Abused: No   Sexually Abused: No    FAMILY HISTORY: No family history on file.  ALLERGIES:  is allergic to atorvastatin.  MEDICATIONS:  Current Outpatient Medications  Medication Sig Dispense Refill   albuterol (VENTOLIN HFA) 108 (90 Base) MCG/ACT inhaler Inhale 1-2 puffs into the lungs every 6 (six) hours as needed for wheezing or shortness of breath.     amLODipine (NORVASC) 5 MG tablet Take 5 mg by mouth daily.     aspirin 81 MG chewable tablet Chew 81 mg by mouth daily.     buPROPion (WELLBUTRIN SR) 150 MG 12 hr tablet Start one week before quit date. Take 1 tab daily x 3 days, then 1 tab BID thereafter. (Patient taking differently: Take 150 mg by mouth daily.) 60 tablet 2   calcium citrate-vitamin D (CITRACAL+D) 315-200 MG-UNIT tablet Take 1 tablet by mouth 2 (two) times daily.     carvedilol (COREG) 6.25 MG tablet Take 6.25 mg by mouth 2 (two) times daily with a meal.     Cholecalciferol (VITAMIN D3) 125 MCG (5000 UT) CAPS Take 5,000 Units by mouth  daily.     co-enzyme Q-10 30 MG capsule Take 30 mg by mouth daily.     cyclobenzaprine (FLEXERIL) 10 MG tablet Take 10 mg by mouth 3 (three) times daily as needed for muscle spasms.     dexamethasone (DECADRON) 4 MG tablet Take 2 tablets (8 mg total) by mouth daily. Take daily x 3 days starting the day after cisplatin chemotherapy. Take with food. 30 tablet 1   diphenoxylate-atropine (LOMOTIL) 2.5-0.025 MG tablet Take 1 tablet by mouth 4 (four) times daily as needed for diarrhea or loose stools. 30 tablet 0   Evolocumab (REPATHA SURECLICK) 482 MG/ML SOAJ Inject 140 mg into the skin every 14 (fourteen) days.     Flaxseed, Linseed, (FLAXSEED OIL) 1000 MG CAPS Take 2,000 mg by mouth daily.     fluconazole (DIFLUCAN) 10 MG/ML suspension Take 7mL today, then 11mL daily for 20 more days. 220 mL 0   lidocaine (  XYLOCAINE) 2 % solution Patient: Mix 1part 2% viscous lidocaine, 1part H20. Swish & swallow 31mL of diluted mixture, 10min before meals and at bedtime, up to QID 200 mL 3   lidocaine-prilocaine (EMLA) cream Apply to affected area once 30 g 3   lisinopril (ZESTRIL) 20 MG tablet Take 1 tablet by mouth daily.     magnesium oxide (MAG-OX) 400 (240 Mg) MG tablet Take 1 tablet (400 mg total) by mouth 2 (two) times daily. 14 tablet 0   Multiple Vitamins-Minerals (MULTIVITAMIN WOMEN 50+ PO) Take 1 tablet by mouth daily.     nicotine (NICODERM CQ - DOSED IN MG/24 HOURS) 21 mg/24hr patch Place 21 mg onto the skin daily.     Nutritional Supplements (FEEDING SUPPLEMENT, OSMOLITE 1.5 CAL,) LIQD Osmolite 1.5 x 6 cartons/day - Give 1 1/2 cartons via PEG QID. Flush tube with 60 ml water before and after each feeding. Drink by mouth or give via tube additional 720 ml (3c.) water daily. 1422 mL 0   omeprazole (FIRST-OMEPRAZOLE) 2 mg/mL SUSP oral suspension Take 10 mLs (20 mg total) by mouth daily. 300 mL 0   ondansetron (ZOFRAN) 8 MG tablet Take 1 tablet (8 mg total) by mouth 2 (two) times daily as needed. Start on  the third day after cisplatin chemotherapy. 30 tablet 1   Oxycodone HCl 10 MG TABS Take 1 tablet (10 mg total) by mouth every 12 (twelve) hours as needed (cancer pain). 60 tablet 0   potassium chloride (KLOR-CON) 10 MEQ tablet Take 10 mEq by mouth daily.     predniSONE (DELTASONE) 20 MG tablet Take 20 mg by mouth daily as needed (COPD).     prochlorperazine (COMPAZINE) 10 MG tablet Take 1 tablet (10 mg total) by mouth every 6 (six) hours as needed (Nausea or vomiting). 30 tablet 1   scopolamine (TRANSDERM-SCOP) 1 MG/3DAYS Place 1 patch (1.5 mg total) onto the skin every 3 (three) days. 10 patch 1   sharps container Use as directed for sharps disposal     SPIRIVA RESPIMAT 2.5 MCG/ACT AERS Inhale 2 puffs into the lungs daily.     sucralfate (CARAFATE) 1 GM/10ML suspension Take 10 mLs (1 g total) by mouth 4 (four) times daily -  with meals and at bedtime. 420 mL 2   vitamin B-12 (CYANOCOBALAMIN) 500 MCG tablet Take 500 mcg by mouth daily.     zolpidem (AMBIEN) 5 MG tablet Take 1 tablet (5 mg total) by mouth at bedtime as needed for sleep. 30 tablet 0   No current facility-administered medications for this visit.    PHYSICAL EXAMINATION:  ECOG PERFORMANCE STATUS: 1 - Symptomatic but completely ambulatory  Vitals:   11/25/20 1410  BP: 133/67  Pulse: (!) 104  Resp: 18  Temp: 97.6 F (36.4 C)  SpO2: 98%    Filed Weights   11/25/20 1410  Weight: 132 lb 6 oz (60 kg)    Physical Exam Constitutional:      Appearance: Normal appearance.  HENT:     Head: Normocephalic and atraumatic.     Mouth/Throat:     Pharynx: Posterior oropharyngeal erythema (erythema without ulceration) present. No oropharyngeal exudate.  Cardiovascular:     Rate and Rhythm: Normal rate and regular rhythm.     Pulses: Normal pulses.     Heart sounds: Normal heart sounds.  Pulmonary:     Effort: Pulmonary effort is normal.     Breath sounds: Normal breath sounds.  Abdominal:     General: Abdomen  is flat.  Bowel sounds are normal.     Palpations: Abdomen is soft.  Musculoskeletal:        General: Swelling (BLE swelling significantly improved) present. No tenderness.     Cervical back: Normal range of motion and neck supple.  Lymphadenopathy:     Cervical: No cervical adenopathy.  Skin:    General: Skin is warm and dry.     Findings: No erythema or rash (Postradiation hyperpigmentation).  Neurological:     General: No focal deficit present.     Mental Status: She is alert.  Psychiatric:        Mood and Affect: Mood normal.   LABORATORY DATA:    Lab Results  Component Value Date   WBC 7.9 11/25/2020   HGB 12.2 11/25/2020   HCT 35.8 (L) 11/25/2020   MCV 102.6 (H) 11/25/2020   PLT 260 11/25/2020     Chemistry      Component Value Date/Time   NA 138 11/25/2020 1350   K 3.8 11/25/2020 1350   CL 101 11/25/2020 1350   CO2 25 11/25/2020 1350   BUN 13 11/25/2020 1350   CREATININE 0.83 11/25/2020 1350      Component Value Date/Time   CALCIUM 9.2 11/25/2020 1350   ALKPHOS 55 07/13/2020 1239   AST 14 (L) 07/13/2020 1239   ALT 8 07/13/2020 1239   BILITOT 0.3 07/13/2020 1239       In situ study for HPV 16/18 is negative. Slides will ne sent to [facility] for a broader HR HPV panel  Addendum electronically signed by Isac Caddy Askin, MD on 06/18/2020 at  5:04 PM  Addendum    The p16 immunostain is positive. HR HPV study ordered.  Addendum electronically signed by Isac Caddy Askin, MD on 06/18/2020 at  1:57 PM  Diagnosis    A: Throat, right, biopsy - Fragments of keratinizing well differentiated squamous carcinoma.  A single focus of submucosa with invasive carcinoma is present   Addendum 3    Noted 07/01/2000: Tissue was sent to [facility] for a broad HR HPV ISH study.  Results were: Negative.  The controls were appropriate    CBC from today showed improvement in cytopenia.  RADIOGRAPHIC STUDIES: I have personally reviewed the radiological images as listed and  agreed with the findings in the report.   VAS [place] LOWER EXTREMITY VENOUS (DVT)  Result Date: 11/12/2020  Lower Venous DVT Study Patient Name:  Cathy Knight  Date of Exam:   11/11/2020 Medical Rec #: [id number]         Accession #:    [id number] Date of Birth: August 28, 1950         Patient Gender: F Patient Age:   79 years Exam Location:  [facility] Procedure:      VAS [place] LOWER EXTREMITY VENOUS (DVT) Referring Phys: Arletha Pili Thi Sisemore --------------------------------------------------------------------------------  Indications: Swelling.  Risk Factors: Cancer. Limitations: Poor ultrasound/tissue interface. Comparison Study: No prior studies. Performing Technologist: Oliver Hum RVT  Examination Guidelines: A complete evaluation includes B-mode imaging, spectral Doppler, color Doppler, and power Doppler as needed of all accessible portions of each vessel. Bilateral testing is considered an integral part of a complete examination. Limited examinations for reoccurring indications may be performed as noted. The reflux portion of the exam is performed with the patient in reverse Trendelenburg.  +---------+---------------+---------+-----------+----------+--------------+ RIGHT    CompressibilityPhasicitySpontaneityPropertiesThrombus Aging +---------+---------------+---------+-----------+----------+--------------+ CFV      Full           Yes  Yes                                 +---------+---------------+---------+-----------+----------+--------------+ SFJ      Full                                                        +---------+---------------+---------+-----------+----------+--------------+ FV Prox  Full                                                        +---------+---------------+---------+-----------+----------+--------------+ FV Mid   Full                                                         +---------+---------------+---------+-----------+----------+--------------+ FV DistalFull                                                        +---------+---------------+---------+-----------+----------+--------------+ PFV      Full                                                        +---------+---------------+---------+-----------+----------+--------------+ POP      Full           Yes      Yes                                 +---------+---------------+---------+-----------+----------+--------------+ PTV      Full                                                        +---------+---------------+---------+-----------+----------+--------------+ PERO     Full                                                        +---------+---------------+---------+-----------+----------+--------------+   +---------+---------------+---------+-----------+----------+--------------+ LEFT     CompressibilityPhasicitySpontaneityPropertiesThrombus Aging +---------+---------------+---------+-----------+----------+--------------+ CFV      Full           Yes      Yes                                 +---------+---------------+---------+-----------+----------+--------------+ SFJ      Full                                                        +---------+---------------+---------+-----------+----------+--------------+  FV Prox  Full                                                        +---------+---------------+---------+-----------+----------+--------------+ FV Mid   Full                                                        +---------+---------------+---------+-----------+----------+--------------+ FV DistalFull                                                        +---------+---------------+---------+-----------+----------+--------------+ PFV      Full                                                         +---------+---------------+---------+-----------+----------+--------------+ POP      Full           Yes      Yes                                 +---------+---------------+---------+-----------+----------+--------------+ PTV      Full                                                        +---------+---------------+---------+-----------+----------+--------------+ PERO     Full                                                        +---------+---------------+---------+-----------+----------+--------------+ 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 Monophasic flow is noted in the popliteal artery.    Summary: RIGHT: - There is no evidence of deep vein thrombosis in the lower extremity.  - No cystic structure found in the popliteal fossa.  LEFT: - There is no evidence of deep vein thrombosis in the lower extremity.  - No cystic structure found in the popliteal fossa. 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 Monophasic flow is noted in the popliteal artery.  *See table(s) above for measurements and observations. Electronically signed by Orlie Pollen on 11/12/2020 at 7:29:18 PM.    Final       Benay Pike, MD 11/26/2020 10:45 PM

## 2020-11-26 NOTE — Assessment & Plan Note (Signed)
Better controlled. No more narcotics She can use tylenol as needed.

## 2020-11-26 NOTE — Assessment & Plan Note (Signed)
She will follow up with speech therapy so she can re initiate oral feedings She continues on G tube feedings for now. She can eat some soup. She will be followed by speech therapy and Dr Isidore Moos. She was encouraged to call us with any ongoing issues.

## 2020-11-26 NOTE — Assessment & Plan Note (Signed)
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 She received 6 out of 7 planned cycles of chemotherapy.  Last treatment on September 07, 2020.  She is here for follow-up after completion of chemoradiation. End of treatment PET planned in October followed by appointment with Dr. Isidore Moos to review imaging. Review of systems today pertinent for ongoing issues with tolerating oral diet as well as some sore throat, recent episodes of confusion. She stopped using oxycodone. She believes this might have contributed to some of her confusion. PE today, mild erythema, improving BLE swelling, otherwise appears well She will follow up with speech therapy to assist her with restarting oral feeding.  No more pain medicaiton, can use tylenol as needed. BLE swelling significantly improved, encouraged her to follow up with vascular surgery to assist with management of superficial femoral artery occlusion. She expressed understanding of the recommendations. She will follow up with Dr Isidore Moos after her PET CT RTC with [place] in about 6 months

## 2020-11-29 ENCOUNTER — Ambulatory Visit: Payer: Medicare Other

## 2020-11-29 NOTE — Progress Notes (Signed)
Nutrition Follow-up:  Patient has completed treatment (September 22, 2020) for cancer of oropharynx.   Spoke with patient via phone for nutrition follow-up.  Patient reports that she has no appetite to eat but is forcing herself to eat orally.  Reports no moisture in mouth or taste. Reports nausea after feeding, not taking nausea medication.  Reports diarrhea after most feedings for the past week.  Reports yesterday ate few bites of lentil soup. Friday ate few bites of vegetable soup.  Does not like oral nutrition supplements.  They are "nasty". Reports that she is giving 1 1/2 cartons of osmolite 1.5 3 times per day (am, early pm and evening).  Says that she gets nauseated after feeding.     Medications: reviewed  Labs: reviewed  Anthropometrics:   Weight 132 lb on 9/29  139 lb on 9/2 143 lb on 7/26   Estimated Energy Needs  Kcals: 2070-2215 Protein: 110-125 g Fluid: 2.2 L  NUTRITION DIAGNOSIS: Inadequate oral intake continues   INTERVENTION:  Recommend patient take nausea medication and diarrhea medication as directed to treat symptoms.  Continue osmolite 1.5 1 1/2 cartons TID. Encouraged patient to take a few bites of oral food at meal times and if only taking few bites follow it up with tube feeding.  (Ex few bites scrambled eggs, hot cereal for breakfast) Reviewed ways to add calories to diet.     MONITORING, EVALUATION, GOAL: weight trends, intake, tube feeding   NEXT VISIT: Thursday, Oct 6 phone call with Jerrell Belfast B. Zenia Resides, [place], [place] Registered Dietitian [phone] (mobile)

## 2020-12-02 ENCOUNTER — Ambulatory Visit: Payer: Medicare Other | Attending: Radiation Oncology

## 2020-12-02 ENCOUNTER — Other Ambulatory Visit: Payer: Self-pay

## 2020-12-02 ENCOUNTER — Encounter: Payer: Medicare Other | Admitting: Nutrition

## 2020-12-02 ENCOUNTER — Telehealth: Payer: Self-pay | Admitting: Nutrition

## 2020-12-02 DIAGNOSIS — R1310 Dysphagia, unspecified: Secondary | ICD-10-CM | POA: Insufficient documentation

## 2020-12-02 NOTE — Therapy (Addendum)
[facility] [address] [place], [place], [address] Phone: [phone]   Fax:  [phone]  Speech Language Pathology Treatment/Renewal Summary  Patient Details  Name: Cathy Knight MRN: [id number] Date of Birth: Mar 09, 1950 No data recorded  Encounter Date: 12/02/2020   End of Session - 12/02/20 1158     Visit Number 2    Number of Visits 4    Date for SLP Re-Evaluation 03/02/21    SLP Start Time 1104    SLP Stop Time  1133    SLP Time Calculation (min) 29 min    Activity Tolerance Patient tolerated treatment well             Past Medical History:  Diagnosis Date   Cancer ([facility])    Hypertension     Past Surgical History:  Procedure Laterality Date   IR CM INJ ANY COLONIC TUBE W/FLUORO  08/09/2020   IR GASTROSTOMY TUBE MOD SED  07/28/2020   IR IMAGING GUIDED PORT INSERTION  07/28/2020    There were no vitals filed for this visit.   Subjective Assessment - 12/02/20 1101     Subjective Pt brings water in with her. "Everywhere I go it goes with me."    Currently in Pain? No/denies                   ADULT SLP TREATMENT - 12/02/20 1107       General Information   Behavior/Cognition Alert;Cooperative;Pleasant mood      Treatment Provided   Treatment provided Dysphagia      Dysphagia Treatment   Temperature Spikes Noted No    Respiratory Status Room air    Treatment Methods Skilled observation;Therapeutic exercise;Patient/caregiver education    Patient observed directly with PO's Yes    Type of PO's observed Dysphagia 1 (puree);Thin liquids    Liquids provided via Straw    Oral Phase Signs & Symptoms --   none noted today   Pharyngeal Phase Signs & Symptoms --   none noted today   Other treatment/comments Pt coughing throughout session ("I cough all the time. Always have. I have COPD.") Pt states frequency of cough has not increased since initiation of rad tx. SLP educated rationale for  HEP at beginning of session. Pt completed HEP with consistent mod A faded to independent. "It's easy, I just have to do it." SLP shared that pt needs to start small (2-4 reps each - what she feels she can do) and work her way up to 10 reps each for 1,2,5, and supersupraglottic (3 reps for [place]), adn that CONSISTENCY was most important thing. Pt was also encouraged to start small amounts of dys I items and stay consistent as she was safe with dys I/thin today to work her way back into solid foods again. She was told it might be beneficial to talk to dietician Lurline Del) about this too. She stated she would do so. At end of session pt told SLP the rationale for HEP.      Assessment / Recommendations / Plan   Plan Continue with current plan of care      Progression Toward Goals   Progression toward goals Progressing toward goals              SLP Education - 12/02/20 1157     Education Details see daily note for details    Person(s) Educated Patient    Methods Explanation;Handout;Demonstration;Verbal cues    Comprehension Verbalized understanding;Returned demonstration;Verbal  cues required;Need further instruction              SLP Short Term Goals -                      SLP SHORT TERM GOAL #1    Title pt will tell SLP why pt is completing HEP with modified independence    Time 1    Period --   visits, for all STGs    status acheived           SLP SHORT TERM GOAL #2    Title pt will complete HEP with rare min A in 2 sessions    Time 1    Status ongoing           SLP SHORT TERM GOAL #3    Title pt will describe 3 overt s/s aspiration PNA with modified independence    Time 2    Status ongoing          SLP SHORT TERM GOAL #4    Title pt will tell SLP how a food journal could hasten return to a more normalized diet    Time 2    Status ongoing                    SLP Long Term Goals -                     SLP LONG TERM GOAL #1    Title pt will complete HEP with  modified independence over tree visits    Time 3    Period --   visits, for all LTGs    Status Ongoing           SLP LONG TERM GOAL #2    Title pt will complete HEP with independence over two visits    Time 4    Status Ongoing           SLP LONG TERM GOAL #3    Title pt will describe how to modify HEP over time, and the timeline associated with reduction in HEP frequency with modified independence over two sessions    Time 5    Status Ongoing                     Plan -       Clinical Impression Statement At this time pt swallowing is deemed WNL/WFL with dysphagia I diet and thin liquids. SLP designed an reviewed pt's  HEP for dysphagia (she has not completed this at all since last session withSLP in June) and pt completed each exercise on their own with mod cues faded to independent. There are no overt s/s aspiration PNA observed, and none reported by pt at this time. Data indicate that pt's swallow ability will likely decrease over the course of radiation therapy and could very well decline over time following conclusion of their radiation therapy due to muscle disuse atrophy and/or muscle fibrosis. Pt will cont to need to be seen by SLP in order to assess safety of PO intake, assess the need for recommending any objective swallow assessment, and ensuring pt correctly completes the individualized HEP.    Speech Therapy Frequency --   once approx every 4 weeks    Duration --   90 days/12 weeks    Treatment/Interventions Aspiration precaution training;Pharyngeal strengthening exercises;Diet toleration management by SLP;Trials of upgraded texture/liquids;Internal/external aids;Patient/family education;SLP instruction and feedback;Compensatory strategies  Potential to Achieve Goals Good    SLP Home Exercise Plan provided today    Consulted and Agree with Plan of Care Patient           Patient will benefit from skilled therapeutic intervention in order to improve the following  deficits and impairments:   Dysphagia, unspecified type    Problem List Patient Active Problem List   Diagnosis Date Noted   Weight loss 10/12/2020   Swelling of lower extremity 10/12/2020   Chemotherapy-induced nausea 09/13/2020   Port-A-Cath in place 09/06/2020   Mucositis due to antineoplastic therapy 08/17/2020   G tube feedings ([place]) 08/10/2020   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 [place], [place], [address] Phone: [phone]   Fax:  [phone]   Name: Cathy Knight MRN: [id number] Date of Birth: Sep 29, 1950

## 2020-12-02 NOTE — Telephone Encounter (Signed)
Contacted patient by telephone for nutrition follow-up.  Patient did not answer and I was unable to leave a message.  We will continue to try to touch base with patient to assist with transitioning to oral intake.

## 2020-12-02 NOTE — Patient Instructions (Signed)
HEP from eval given again to pt.

## 2020-12-02 NOTE — Addendum Note (Signed)
Addended by: Garald Balding B on: 12/02/2020 12:03 PM   Modules accepted: Orders

## 2020-12-06 ENCOUNTER — Telehealth: Payer: Self-pay

## 2020-12-06 NOTE — Telephone Encounter (Signed)
Nutrition  Called patient for nutrition follow-up.  No answer.  Left message with call back number on voicemail.  Adrienne Trombetta B. Zenia Resides, [place], [place] Registered Dietitian [phone] (mobile)

## 2020-12-09 ENCOUNTER — Telehealth: Payer: Self-pay | Admitting: Nutrition

## 2020-12-09 NOTE — Telephone Encounter (Signed)
Contacted patient by telephone for nutrition follow-up.  Patient did not answer.  Left message with name and phone number for return call.

## 2020-12-16 NOTE — Unmapped (Signed)
[facility] has made a third and final attempt to reach this patient to refill the following medication: Repatha 140 mg/mL.      We have left voicemails on the following phone numbers: [phone]. We also sent a MyChart message.    Dates contacted: 8/5, 8/12, 12/02/20  Last scheduled delivery: 07/22/20 (84 day supply)    The patient may be at risk of non-compliance with this medication. The patient should call the [facility] at [phone]  Option 4, then Option 2 (all other specialty patients) to refill medication.    [name]   [facility] Specialty Pharmacist

## 2020-12-30 ENCOUNTER — Ambulatory Visit: Payer: Medicare Other | Attending: Radiation Oncology

## 2020-12-30 ENCOUNTER — Encounter (HOSPITAL_COMMUNITY)
Admission: RE | Admit: 2020-12-30 | Discharge: 2020-12-30 | Disposition: A | Discharge status: Home / Self Care | Payer: Medicare Other | Source: Ambulatory Visit | Attending: Radiation Oncology | Admitting: Radiation Oncology

## 2020-12-30 ENCOUNTER — Other Ambulatory Visit: Payer: Self-pay

## 2020-12-30 DIAGNOSIS — R1310 Dysphagia, unspecified: Secondary | ICD-10-CM | POA: Insufficient documentation

## 2020-12-30 DIAGNOSIS — Z08 Encounter for follow-up examination after completed treatment for malignant neoplasm: Secondary | ICD-10-CM | POA: Insufficient documentation

## 2020-12-30 DIAGNOSIS — C109 Malignant neoplasm of oropharynx, unspecified: Secondary | ICD-10-CM | POA: Insufficient documentation

## 2020-12-30 LAB — GLUCOSE, CAPILLARY: Glucose-Capillary: 85 mg/dL (ref 70–99)

## 2020-12-30 MED ORDER — FLUDEOXYGLUCOSE F - 18 (FDG) INJECTION
6.5000 | Freq: Once | INTRAVENOUS | Status: AC
  Administered 2020-12-30: 6.5 via INTRAVENOUS

## 2020-12-30 NOTE — Therapy (Signed)
[facility] [address], [place], [place], [address] Phone: [phone]   Fax:  [phone]  Speech Language Pathology Treatment  Patient Details  Name: Cathy Knight MRN: [id number] Date of Birth: [birth year] No data recorded  Encounter Date: 12/30/2020   End of Session - 12/30/20 1434     Visit Number 3    Number of Visits 4    Date for SLP Re-Evaluation 03/02/21    SLP Start Time 1404    SLP Stop Time  1430    SLP Time Calculation (min) 26 min    Activity Tolerance Patient tolerated treatment well             Past Medical History:  Diagnosis Date   Cancer ([place])    Hypertension     Past Surgical History:  Procedure Laterality Date   IR CM INJ ANY COLONIC TUBE W/FLUORO  08/09/2020   IR GASTROSTOMY TUBE MOD SED  07/28/2020   IR IMAGING GUIDED PORT INSERTION  07/28/2020    There were no vitals filed for this visit.          ADULT SLP TREATMENT - 12/30/20 1358       General Information   Behavior/Cognition Alert;Cooperative;Pleasant mood      Treatment Provided   Treatment provided Dysphagia      Dysphagia Treatment   Temperature Spikes Noted No    Respiratory Status Room air    Treatment Methods Skilled observation;Therapeutic exercise;Patient/caregiver education    Patient observed directly with PO's Yes    Type of PO's observed Dysphagia 1 (puree);Thin liquids    Liquids provided via --   bottle   Oral Phase Signs & Symptoms --   none noted today   Pharyngeal Phase Signs & Symptoms --   none noted today   Other treatment/comments Pt eating salad, meatloaf, eggs, and soups the last week. ? if pt is still PEG dependent despite her variety of food, as she has not had PEG removed. Denies coughing and other overt s/sx oral and pharyngeal difficulty with POs. Today pt refused cheese stick, pretzels, sandwich due to gummy texture so had applesauce again. Pt reports she has not done HEP more than 2-3 times  since previous ST. SLP provided mod A consistently for HEP procedure and pt independent with all HEP (most diffiulty with [place]) by session end. SLP again told pt rationale for HEP and encouraged her to start with limited reps to get into the habit of performing HEP and then ramping up to recommended amount of reps ASAP.      Assessment / Recommendations / Plan   Plan --   every other month as pt remains safe with POs and noncompliant with HEP     Progression Toward Goals   Progression toward goals Not progressing toward goals (comment)   chosen not to complete HEP             SLP Education - 12/30/20 1434     Education Details rationale for HEP, procedure for HEP    Person(s) Educated Patient    Methods Explanation    Comprehension Verbalized understanding            SLP Short Term Goals -  12-30-20 1437                    SLP SHORT TERM GOAL #1    Title pt will tell SLP why pt is completing HEP with modified independence  Time 1    Period --   visits, for all STGs    status acheived           SLP SHORT TERM GOAL #2    Title pt will complete HEP with rare min A in 2 sessions    Time     Status Not met           SLP SHORT TERM GOAL #3    Title pt will describe 3 overt s/s aspiration PNA with modified independence    Time 1    Status ongoing           SLP SHORT TERM GOAL #4    Title pt will tell SLP how a food journal could hasten return to a more normalized diet    Time 1    Status ongoing                    SLP Long Term Goals - 12/30/20 1437                    SLP LONG TERM GOAL #1    Title pt will complete HEP with modified independence over three visits    Time 2    Period --   visits, for all LTGs    Status Ongoing           SLP LONG TERM GOAL #2    Title pt will complete HEP with independence over two visits    Time 3    Status Ongoing           SLP LONG TERM GOAL #3    Title pt will describe how to modify HEP over time, and the  timeline associated with reduction in HEP frequency with modified independence over two sessions    Time 4    Status Ongoing                     Plan -       Clinical Impression Statement At this time pt swallowing is deemed WNL/WFL with dysphagia I diet and thin liquids, although SLP suspects pt is safe with up to dys III foods as she reported eating soft foods and has no overt s/sx aspiration PNA at this time. SLP  reviewed pt's  HEP for dysphagia (she has not completed this > 2-3 times since last session with SLP last month) and pt completed each exercise on their own with mod cues faded to independent. SLP again told pt rationale for HEP completion in hopes this will encourage her to complete with regularity at prescribed frequency. Data indicate that pt's swallow ability will likely decrease over the course of radiation therapy and could very well decline over time following conclusion of their radiation therapy due to muscle disuse atrophy and/or muscle fibrosis. SLP to reduce frequency of ST follow up due to pt safe with POs and cont to choose not to complete HEP despite SLP encouragement and explanation of HEP rationale. Pt will cont to need to be seen by SLP in order to assess safety of PO intake, assess the need for recommending any objective swallow assessment, and ensuring pt correctly completes the individualized HEP.    Speech Therapy Frequency --   once approx every 4 weeks    Duration --   90 days/12 weeks    Treatment/Interventions Aspiration precaution training;Pharyngeal strengthening exercises;Diet toleration management by SLP;Trials of upgraded texture/liquids;Internal/external aids;Patient/family education;SLP instruction and feedback;Compensatory strategies  Potential to Achieve Goals Good    SLP Home Exercise Plan provided today    Consulted and Agree with Plan of Care Patient            Patient will benefit from skilled therapeutic intervention in order to  improve the following deficits and impairments:   Dysphagia, unspecified type    Problem List Patient Active Problem List   Diagnosis Date Noted   Weight loss 10/12/2020   Swelling of lower extremity 10/12/2020   Chemotherapy-induced nausea 09/13/2020   Port-A-Cath in place 09/06/2020   Mucositis due to antineoplastic therapy 08/17/2020   G tube feedings ([place]) 08/10/2020   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, [place] [address] [place], [place], [address] Phone: [phone]   Fax:  [phone]   Name: Cathy Knight MRN: [id number] Date of Birth: 02-11-1951

## 2020-12-31 ENCOUNTER — Inpatient Hospital Stay: Payer: Medicare Other | Attending: Hematology and Oncology | Admitting: Dietician

## 2020-12-31 ENCOUNTER — Other Ambulatory Visit: Payer: Self-pay

## 2020-12-31 ENCOUNTER — Ambulatory Visit
Admission: RE | Admit: 2020-12-31 | Discharge: 2020-12-31 | Disposition: A | Discharge status: Home / Self Care | Payer: Medicare Other | Source: Ambulatory Visit | Attending: Radiation Oncology | Admitting: Radiation Oncology

## 2020-12-31 VITALS — BP 80/71 | HR 105 | Temp 97.2°F | Resp 18 | Ht 66.0 in | Wt 117.0 lb

## 2020-12-31 DIAGNOSIS — Z79899 Other long term (current) drug therapy: Secondary | ICD-10-CM | POA: Diagnosis not present

## 2020-12-31 DIAGNOSIS — J432 Centrilobular emphysema: Secondary | ICD-10-CM | POA: Diagnosis not present

## 2020-12-31 DIAGNOSIS — C090 Malignant neoplasm of tonsillar fossa: Secondary | ICD-10-CM

## 2020-12-31 DIAGNOSIS — Z923 Personal history of irradiation: Secondary | ICD-10-CM | POA: Diagnosis not present

## 2020-12-31 DIAGNOSIS — I890 Lymphedema, not elsewhere classified: Secondary | ICD-10-CM | POA: Diagnosis not present

## 2020-12-31 DIAGNOSIS — I700 Atherosclerosis of aorta: Secondary | ICD-10-CM | POA: Diagnosis not present

## 2020-12-31 DIAGNOSIS — Z85819 Personal history of malignant neoplasm of unspecified site of lip, oral cavity, and pharynx: Secondary | ICD-10-CM | POA: Insufficient documentation

## 2020-12-31 DIAGNOSIS — C109 Malignant neoplasm of oropharynx, unspecified: Secondary | ICD-10-CM

## 2020-12-31 DIAGNOSIS — F17210 Nicotine dependence, cigarettes, uncomplicated: Secondary | ICD-10-CM | POA: Insufficient documentation

## 2020-12-31 DIAGNOSIS — F32A Depression, unspecified: Secondary | ICD-10-CM | POA: Insufficient documentation

## 2020-12-31 DIAGNOSIS — Z7982 Long term (current) use of aspirin: Secondary | ICD-10-CM | POA: Diagnosis not present

## 2020-12-31 NOTE — Unmapped (Signed)
Specialty Medication(s): Repatha    Tanya Gordon has been dis-enrolled from the [facility] specialty pharmacy services due to multiple unsuccessful outreach attempts by the pharmacy.    Additional information provided to the patient: n/a    [name]  [facility] Specialty Pharmacist

## 2020-12-31 NOTE — Progress Notes (Signed)
Ms. Freundlich presents today for follow-up after completing radiation to her right tonsil 09/23/2020, and to review PET scan results from 12/30/2020  Pain issues, if any: Reports mouth/throat pain have almost completely resolved.  Using a feeding tube?: Has not used for past 1.5-2 weeks. Plans to resume using temporaily to help push fluids and nutrition  Weight changes, if any:  Wt Readings from Last 3 Encounters:  12/31/20 117 lb (53.1 kg)  11/25/20 132 lb 6 oz (60 kg)  11/09/20 137 lb 1.6 oz (62.2 kg)   Swallowing issues, if any: Yes--unable to tolerate bread/drier foods. States she often doesn't know if she'll be able to swallow until she tries to eat the item. Denies any issues with thin liquids; 12/30/2020 Saw Carl Schinke-SLP: "At this time pt swallowing is deemed WNL/WFL with dysphagia I diet and thin liquids, although SLP suspects pt is safe with up to dys III foods as she reported eating soft foods and has no overt s/sx aspiration PNA at this time. SLP  reviewed pt's  HEP for dysphagia (she has not completed this > 2-3 times since last session with SLP last month) and pt completed each exercise on their own with mod cues faded to independent. SLP again told pt rationale for HEP completion in hopes this will encourage her to complete with regularity at prescribed frequency" Smoking or chewing tobacco? None Using fluoride trays daily? N/A--reports she's noticed a couple teeth are loose. Plans to get established with community dentist soon (agreeable to see Dr. Benson Norway if wait time is significant) Last ENT visit was on: Not since diagnosis Other notable issues, if any: Had F/U with medical oncologist Dr. Chryl Heck on 11/25/2020. Denies any ear or jaw pain, or difficulty opening her mouth. Continues to struggle with dry mouth (which makes eating very difficult). Reports thick saliva has improved. Continues to feel fatigued during the day despite sleeping well. Reports mild swelling/lymphedema under chin  and down her neck. States she has started feeling lightheaded and dizzy for the past few days (especially when she's up moving about doing activities). She feels her BP medication may be too high now with her weight loss, and plans to see her PCP this coming Monday to discuss medication management

## 2020-12-31 NOTE — Progress Notes (Addendum)
Oncology Nurse Navigator Documentation   I met with Cathy Knight during her follow up visit to receive results of her recent PET scan. She is having difficulty maintaining her weight with just oral intake and will discuss using her PEG with the Dietician today. 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 She will return to see Dr. Benson Norway for post treatment dental evaluation and she will see Dr. Isidore Moos in 8 months. She is currently scheduled to see Dr. Chryl Heck in December. She is scheduled to see Dr. Ihor Austin (Otolaryngology) at [facility] on 11/9. She knows to call me if I can help her with any concerns or questions that she may have.   Harlow Asa RN, BSN, OCN Head & Neck Oncology Nurse [place] at [facility] Phone # [phone]  Fax # [phone]

## 2020-12-31 NOTE — Progress Notes (Signed)
Nutrition Follow-up:  Patient has completed treatment for cancer of oropharynx.  Met with patient and her partner in office. Patient reports trying a variety of foods by mouth. She reports able to tell if she will be able to swallow foods on the first bite. Recalls tolerating mostly soups, salads with lots of ranch, chicken breast in gravy over rice. Patient reports severe dry mouth. She is drinking 64 ounces of water everyday. She is using biotene mouth rinses as well as a cool mist humidifier at night. Patient continues to use feeding tube for supplemental nutrition, reports ongoing diarrhea immediately after feedings. Patient has not used tube in the past couple of days due to this. She is asking if there is a different formula she could try until her appetite/intake are sufficient to maintain her weights.   Medications: reviewed  Labs: reviewed  Anthropometrics: Weight 117 lb today decreased 15 lbs (12.8%) in 5 weeks. Weights have decreased 24 lbs (17%) since completing treatment in July. This is severe.   9/29 - 132 lb 6 oz 9/13 - 137 lb 1.6 oz 8/16 - 141 lb 4.8 oz  Estimated Energy Needs  Kcals: 2070-2215 Protein: 110-125 Fluid: 2.2 L  NUTRITION DIAGNOSIS: Inadequate oral intake ongoing  INTERVENTION:  Reinforced importance of continued increased calorie and protein energy needs to promote healing Encouraged soft, moist high protein foods orally and reviewed strategies for ways to alter textures of foods for ease of intake Patient does not like Boost/Ensure supplements, provided samples of CIB powder for pt to try  Discussed ways to increase calories and protein - shake recipes provided Provided sample case of [employer] 1.4 for pt to try via tube. If tolerating formula, patient instructed to contact RD for submitting new TF orders Recommend pt supplement with 1 carton [employer] 1.4 twice daily via tube. Flush tube with 60 ml water before and after each feeding. This will provide  910 kcal, 40 grams protein, 708 ml total water.  Patient provided contact information    MONITORING, EVALUATION, GOAL: weight trends, intake   NEXT VISIT: Wednesday, December 12 for weight check    

## 2021-01-03 ENCOUNTER — Ambulatory Visit: Payer: Medicare Other | Admitting: Nutrition

## 2021-01-03 ENCOUNTER — Encounter: Payer: Self-pay | Admitting: Radiation Oncology

## 2021-01-03 MED ORDER — KATE FARMS STANDARD 1.4 EN LIQD: 1.0000 | Freq: Four times a day (QID) | ENTERAL | 6 refills | Status: DC

## 2021-01-03 NOTE — Progress Notes (Signed)
Radiation Oncology         [phone] ________________________________  Name: Cathy Knight MRN: [id number]  Date: 12/31/2020  DOB: [birth year]  Follow-Up Visit Note  CC: Alphia Moh, MD  Alphia Moh, MD  Diagnosis and Prior Radiotherapy:    C09.0   ICD-10-CM   1. 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      Cancer Staging Cancer of tonsillar fossa ([place]) Staging form: Pharynx - P16 Negative Oropharynx, AJCC 8th Edition - Clinical stage from 07/20/2020: Stage IVA (cT2, cN2c, cM0, p16-) - Signed by Eppie Gibson, MD on 07/20/2020 Stage prefix: Initial diagnosis  Squamous cell carcinoma of oropharynx ([place]) Staging form: Pharynx - P16 Negative Oropharynx, AJCC 8th Edition - Clinical stage from 07/13/2020: Stage IVA (cT2, cN2c, cM0, p16-) - Signed by Benay Pike, MD on 07/14/2020 Stage prefix: Initial diagnosis   Radiation Treatment Dates: 08/04/2020 through 09/23/2020 Site Technique Total Dose (Gy) Dose per Fx (Gy) Completed Fx Beam Energies  Tonsil, Right: [id number]_Rt_tonsil IMRT 70/70 2 35/35 6X    CHIEF COMPLAINT:  Here for follow-up and surveillance of throat cancer  Narrative:   Cathy Knight presents today for follow-up after completing radiation to her right tonsil 09/23/2020, and to review PET scan results from 12/30/2020  Pain issues, if any: Reports mouth/throat pain have almost completely resolved.  Using a feeding tube?: Has not used for past 1.5-2 weeks. Plans to resume using temporaily to help push fluids and nutrition  Weight changes, if any:  Wt Readings from Last 3 Encounters:  12/31/20 117 lb (53.1 kg)  11/25/20 132 lb 6 oz (60 kg)  11/09/20 137 lb 1.6 oz (62.2 kg)   Swallowing issues, if any: Yes--unable to tolerate bread/drier foods. States she often doesn't know if she'll be able to swallow until she tries to eat the item. Denies any issues with thin liquids; 12/30/2020 Saw Carl Schinke-SLP: "At  this time pt swallowing is deemed WNL/WFL with dysphagia I diet and thin liquids, although SLP suspects pt is safe with up to dys III foods as she reported eating soft foods and has no overt s/sx aspiration PNA at this time. SLP  reviewed pt's  HEP for dysphagia (she has not completed this > 2-3 times since last session with SLP last month) and pt completed each exercise on their own with mod cues faded to independent. SLP again told pt rationale for HEP completion in hopes this will encourage her to complete with regularity at prescribed frequency" Smoking or chewing tobacco?  She is still smoking Using fluoride trays daily? N/A--reports she's noticed a couple teeth are loose. Plans to get established with community dentist soon (agreeable to see Dr. Benson Norway if wait time is significant) Last ENT visit was on: Not since diagnosis Other notable issues, if any: Had F/U with medical oncologist Dr. Chryl Heck on 11/25/2020. Denies any ear or jaw pain, or difficulty opening her mouth. Continues to struggle with dry mouth (which makes eating very difficult). Reports thick saliva has improved. Continues to feel fatigued during the day despite sleeping well. Reports mild swelling/lymphedema under chin and down her neck. States she has started feeling lightheaded and dizzy for the past few days (especially when she's up moving about doing activities). She feels her BP medication may be too high now with her weight loss, and plans to see her PCP this coming Monday to discuss medication management   Upon further questioning  she acknowledges that she has had depression in the past and is experiencing ongoing depressed mood.  She has been on antidepressants in the past.  She has an upcoming appointment with her primary doctor to check her blood pressure and is willing to speak with them about her mood.  She acknowledges that her motivation to take proactive measures to aid in her physical recovery is hindered by her  mood  ALLERGIES:  is allergic to atorvastatin.  Meds: Current Outpatient Medications  Medication Sig Dispense Refill   albuterol (VENTOLIN HFA) 108 (90 Base) MCG/ACT inhaler Inhale 1-2 puffs into the lungs every 6 (six) hours as needed for wheezing or shortness of breath.     amLODipine (NORVASC) 5 MG tablet Take 5 mg by mouth daily.     aspirin 81 MG chewable tablet Chew 81 mg by mouth daily.     buPROPion (WELLBUTRIN SR) 150 MG 12 hr tablet Start one week before quit date. Take 1 tab daily x 3 days, then 1 tab BID thereafter. (Patient taking differently: Take 150 mg by mouth daily.) 60 tablet 2   calcium citrate-vitamin D (CITRACAL+D) 315-200 MG-UNIT tablet Take 1 tablet by mouth 2 (two) times daily.     carvedilol (COREG) 6.25 MG tablet Take 6.25 mg by mouth 2 (two) times daily with a meal.     Cholecalciferol (VITAMIN D3) 125 MCG (5000 UT) CAPS Take 5,000 Units by mouth daily.     co-enzyme Q-10 30 MG capsule Take 30 mg by mouth daily.     cyclobenzaprine (FLEXERIL) 10 MG tablet Take 10 mg by mouth 3 (three) times daily as needed for muscle spasms.     dexamethasone (DECADRON) 4 MG tablet Take 2 tablets (8 mg total) by mouth daily. Take daily x 3 days starting the day after cisplatin chemotherapy. Take with food. 30 tablet 1   diphenoxylate-atropine (LOMOTIL) 2.5-0.025 MG tablet Take 1 tablet by mouth 4 (four) times daily as needed for diarrhea or loose stools. 30 tablet 0   Evolocumab (REPATHA SURECLICK) 086 MG/ML SOAJ Inject 140 mg into the skin every 14 (fourteen) days.     Flaxseed, Linseed, (FLAXSEED OIL) 1000 MG CAPS Take 2,000 mg by mouth daily.     fluconazole (DIFLUCAN) 10 MG/ML suspension Take 25mL today, then 85mL daily for 20 more days. 220 mL 0   lidocaine (XYLOCAINE) 2 % solution Patient: Mix 1part 2% viscous lidocaine, 1part H20. Swish & swallow 4mL of diluted mixture, 59min before meals and at bedtime, up to QID 200 mL 3   lidocaine-prilocaine (EMLA) cream Apply to affected  area once 30 g 3   lisinopril (ZESTRIL) 20 MG tablet Take 1 tablet by mouth daily.     magnesium oxide (MAG-OX) 400 (240 Mg) MG tablet Take 1 tablet (400 mg total) by mouth 2 (two) times daily. 14 tablet 0   Multiple Vitamins-Minerals (MULTIVITAMIN WOMEN 50+ PO) Take 1 tablet by mouth daily.     nicotine (NICODERM CQ - DOSED IN MG/24 HOURS) 21 mg/24hr patch Place 21 mg onto the skin daily.     Nutritional Supplements (KATE FARMS STANDARD 1.4) LIQD 1 Bottle by Enteral route 4 (four) times daily. 1300 mL 6   omeprazole (FIRST-OMEPRAZOLE) 2 mg/mL SUSP oral suspension Take 10 mLs (20 mg total) by mouth daily. 300 mL 0   ondansetron (ZOFRAN) 8 MG tablet Take 1 tablet (8 mg total) by mouth 2 (two) times daily as needed. Start on the third day after cisplatin chemotherapy. [place]  tablet 1   Oxycodone HCl 10 MG TABS Take 1 tablet (10 mg total) by mouth every 12 (twelve) hours as needed (cancer pain). 60 tablet 0   potassium chloride (KLOR-CON) 10 MEQ tablet Take 10 mEq by mouth daily.     predniSONE (DELTASONE) 20 MG tablet Take 20 mg by mouth daily as needed (COPD).     prochlorperazine (COMPAZINE) 10 MG tablet Take 1 tablet (10 mg total) by mouth every 6 (six) hours as needed (Nausea or vomiting). 30 tablet 1   scopolamine (TRANSDERM-SCOP) 1 MG/3DAYS Place 1 patch (1.5 mg total) onto the skin every 3 (three) days. 10 patch 1   sharps container Use as directed for sharps disposal     SPIRIVA RESPIMAT 2.5 MCG/ACT AERS Inhale 2 puffs into the lungs daily.     sucralfate (CARAFATE) 1 GM/10ML suspension Take 10 mLs (1 g total) by mouth 4 (four) times daily -  with meals and at bedtime. 420 mL 2   vitamin B-12 (CYANOCOBALAMIN) 500 MCG tablet Take 500 mcg by mouth daily.     zolpidem (AMBIEN) 5 MG tablet Take 1 tablet (5 mg total) by mouth at bedtime as needed for sleep. 30 tablet 0   No current facility-administered medications for this encounter.    Physical Findings: The patient is in no acute distress.  Patient is alert and oriented. Wt Readings from Last 3 Encounters:  12/31/20 117 lb (53.1 kg)  11/25/20 132 lb 6 oz (60 kg)  11/09/20 137 lb 1.6 oz (62.2 kg)    height is 5\' 6"  (1.676 m) and weight is 117 lb (53.1 kg). Her temporal temperature is 97.2 F (36.2 C) (abnormal). Her blood pressure is 80/71 (abnormal) and her pulse is 105 (abnormal). Her respiration is 18. .  General: Alert and oriented, in no acute distress HEENT: Head is normocephalic. Extraocular movements are intact. Oropharynx is notable for no visible tumor  Neck:   No palpable masses; positive for anterior lymphedema Skin: Skin in treatment fields is intact Lymphatics: see Neck Exam Psychiatric: Judgment and insight are intact. Affect is appropriate. Heart regular rate and rhythm Chest clear to auscultation bilaterally   Lab Findings: Lab Results  Component Value Date   WBC 7.9 11/25/2020   HGB 12.2 11/25/2020   HCT 35.8 (L) 11/25/2020   MCV 102.6 (H) 11/25/2020   PLT 260 11/25/2020    Lab Results  Component Value Date   TSH 0.551 07/23/2020    Radiographic Findings: NM PET Image Restag (PS) Skull Base To Thigh  Result Date: 12/31/2020 CLINICAL DATA:  Subsequent treatment strategy for squamous cell carcinoma of the oropharynx. EXAM: NUCLEAR MEDICINE PET SKULL BASE TO THIGH TECHNIQUE: 6.58 mCi F-18 FDG was injected intravenously. Full-ring PET imaging was performed from the skull base to thigh after the radiotracer. CT data was obtained and used for attenuation correction and anatomic localization. Fasting blood glucose: 85 mg/dl COMPARISON:  06/18/20 FINDINGS: Mediastinal blood pool activity: SUV max 2.73 Liver activity: SUV max NA NECK: No hypermetabolic lymph nodes in the neck. Incidental CT findings: none CHEST: No hypermetabolic mediastinal or hilar nodes. No suspicious pulmonary nodules on the CT scan. Incidental CT findings: Aortic atherosclerosis. Coronary artery calcifications. Mild changes of  centrilobular emphysema. ABDOMEN/PELVIS: No abnormal hypermetabolic activity within the liver, pancreas, adrenal glands, or spleen. No hypermetabolic lymph nodes in the abdomen or pelvis. Incidental CT findings: Mildly complex cyst arising off the upper pole of the left kidney measures 2.7 cm without increased FDG uptake.  Nonobstructing left renal calculi noted. Extensive aortic atherosclerotic disease status post aortobifemoral bypass grafting. Sigmoid diverticulosis without signs of acute inflammation. Gastrostomy tube is identified. SKELETON: No focal hypermetabolic activity to suggest skeletal metastasis. Incidental CT findings: none IMPRESSION: 1. No signs of residual or recurrent FDG avid tumor or metastatic disease. 2. Aortic Atherosclerosis (ICD10-I70.0) and Emphysema (ICD10-J43.9). Coronary artery calcifications. Electronically Signed   By: Kerby Moors M.D.   On: 12/31/2020 09:01    Impression/Plan:    1) Head and Neck Cancer Status: No evidence of disease, coping with weight loss, depressed mood, lack of motivation to follow through with measures to aid in her recovery from treatment. She is also dealing with low blood pressure  2) Low blood pressure: Recommended that she hold her amlodipine and lisinopril and talk to her primary doctor early next week about further measures to optimize this issue  3) Depressed mood: She has been on antidepressants in the past.  Recommended that she speak with her primary doctor about this early next week and consider starting an antidepressant such as an SSRI  4)  Nutritional Status: Her weight is continuing to fall.  She has been lost to follow-up with nutrition.  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  She is still not yet motivated to quit.  We talked about the dangers of continuing to smoke given her diagnosis and I think that addressing her depression is going to be important before she is motivated to quit  7) Swallowing: Patient has been seeing by swallowing therapist and has been encouraged continuously to follow through with their advice, including swallowing exercises  Again, her depressed mood has affected her ability to follow through with proactive measures to heal from treatment.  See above plan  8)  Dental: Encouraged to continue regular followup with dentistry, and dental hygiene including fluoride rinses.  [facility] is referring her back to Dr. Benson Norway  9) Thyroid function: Check annually with medical oncology Lab Results  Component Value Date   TSH 0.551 07/23/2020   10) I will see her back in 8 months and she will continue to follow with otolaryngology and medical oncology per our patient navigator's guidance.  On date of service, in total, I spent 35 minutes on this encounter. Patient was seen in person. _____________________________________   Eppie Gibson, MD

## 2021-01-03 NOTE — Progress Notes (Signed)
D/C Osmolite 1.5. Begin [employer] 1.4, one bottle via PEG, QID with 60 mL free water before and after bolus feeding. Orders written and [facility] notified. Contacted patient.

## 2021-01-18 ENCOUNTER — Ambulatory Visit (INDEPENDENT_AMBULATORY_CARE_PROVIDER_SITE_OTHER): Payer: 59 | Admitting: Dentistry

## 2021-01-18 ENCOUNTER — Other Ambulatory Visit: Payer: Self-pay

## 2021-01-18 ENCOUNTER — Encounter (HOSPITAL_COMMUNITY): Payer: Self-pay | Admitting: Dentistry

## 2021-01-18 VITALS — BP 125/74 | HR 86 | Temp 97.9°F | Wt 118.0 lb

## 2021-01-18 DIAGNOSIS — K036 Deposits [accretions] on teeth: Secondary | ICD-10-CM

## 2021-01-18 DIAGNOSIS — R634 Abnormal weight loss: Secondary | ICD-10-CM

## 2021-01-18 DIAGNOSIS — R432 Parageusia: Secondary | ICD-10-CM | POA: Diagnosis not present

## 2021-01-18 DIAGNOSIS — K117 Disturbances of salivary secretion: Secondary | ICD-10-CM | POA: Diagnosis not present

## 2021-01-18 DIAGNOSIS — Z923 Personal history of irradiation: Secondary | ICD-10-CM | POA: Insufficient documentation

## 2021-01-18 DIAGNOSIS — Y842 Radiological procedure and radiotherapy as the cause of abnormal reaction of the patient, or of later complication, without mention of misadventure at the time of the procedure: Secondary | ICD-10-CM

## 2021-01-18 MED ORDER — SODIUM FLUORIDE 1.1 % DT GEL: DENTAL | 11 refills | Status: DC

## 2021-01-18 NOTE — Progress Notes (Signed)
Department of Dental Medicine               LIMITED EXAM   Service Date:   01/18/2021  Patient Name:   Cathy Knight Date of Birth:   [birth year] Medical Record Number: [id number]   PLAN/RECOMMENDATIONS   Assessment Xerostomia, dysgeusia, weight loss, accretions  Recommendations Brush after meals and at bedtime. Use fluoride at bedtime. Use trismus exercises as directed. Try using CLoSYS for dry mouth and continue to use warm salt water/baking soda rinses as needed. Take multiple sips of water as needed.  Plan Return to the hospital dental clinic  for comprehensive exam and routine dental care including replacement of missing teeth as needed, restorative, cleanings/periodontal therapy and exams. Rx:  Sodium fluoride 1.1% gel to use with fluoride trays Call if any questions or concerns arise.   01/18/2021 Progress Note:  COVID-19 SCREENING:  The patient denies symptoms concerning for COVID-19 infection including fever, chills, cough, or newly developed shortness of breath.   HISTORY OF PRESENT ILLNESS: Cathy Knight is a 70 y.o. female who presents today for an oral examination after chemoradiation therapy for oropharyngeal/tonsillar fossa cancer and for delivery of upper and lower fluoride trays.  The patient has completed 35 of 35 radiation treatments from 08-04-20 to 09-23-20.  They have completed 6 chemotherapy treatments.   REVIEW OF CHIEF COMPLAINTS: Dry mouth: Yes Hard to swallow: No Hurts to swallow:  No Taste changes:  Yes, she says her taste has almost completely returned. Sores in mouth:  No Limited opening:  Denies any symptoms Weight loss:  Yes  118 lbs down from 165 lbs   AT- HOME ORAL HYGIENE REGIMEN: Brushing:  Yes, reports brushing once a day Flossing:  No Rinsing:  Yes, using Biotene rinses as needed. Fluoride:  No (receiving fluoride trays today) Trismus exercises:  No Maximum Interincisal Opening:  46 mm   Patient Active Problem List    Diagnosis Date Noted   Weight loss 10/12/2020   Swelling of lower extremity 10/12/2020   Chemotherapy-induced nausea 09/13/2020   Port-A-Cath in place 09/06/2020   Mucositis due to antineoplastic therapy 08/17/2020   G tube feedings ([place]) 08/10/2020   Squamous cell carcinoma of oropharynx ([place]) 07/13/2020   Cancer related pain 07/13/2020   Cancer of tonsillar fossa ([place]) 07/13/2020   Oropharynx cancer ([place]) 06/29/2020   COPD (chronic obstructive pulmonary disease) ([place]) 04/24/2017   Status epilepticus ([place]) 04/03/2017   Mammogram declined 04/24/2016   Essential hypertension 07/20/2015   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     amLODipine (NORVASC) 5 MG tablet Take 5 mg by mouth daily.     aspirin 81 MG chewable tablet Chew 81 mg by mouth daily.     buPROPion (WELLBUTRIN SR) 150 MG 12 hr tablet Start one week before quit date. Take 1 tab daily x 3 days, then 1 tab BID  thereafter. (Patient taking differently: Take 150 mg by mouth daily.) 60 tablet 2   calcium citrate-vitamin D (CITRACAL+D) 315-200 MG-UNIT tablet Take 1 tablet by mouth 2 (two) times daily.     carvedilol (COREG) 6.25 MG tablet Take 6.25 mg by mouth 2 (two) times daily with a meal.     Cholecalciferol (VITAMIN D3) 125 MCG (5000 UT) CAPS Take 5,000 Units by mouth daily.     co-enzyme Q-10 30 MG capsule Take 30 mg by mouth daily.     cyclobenzaprine  (FLEXERIL) 10 MG tablet Take 10 mg by mouth 3 (three) times daily as needed for muscle spasms.     dexamethasone (DECADRON) 4 MG tablet Take 2 tablets (8 mg total) by mouth daily. Take daily x 3 days starting the day after cisplatin chemotherapy. Take with food. 30 tablet 1   diphenoxylate-atropine (LOMOTIL) 2.5-0.025 MG tablet Take 1 tablet by mouth 4 (four) times daily as needed for diarrhea or loose stools. 30 tablet 0   Evolocumab (REPATHA SURECLICK) 379 MG/ML SOAJ Inject 140 mg into the skin every 14 (fourteen) days.     Flaxseed, Linseed, (FLAXSEED OIL) 1000 MG CAPS Take 2,000 mg by mouth daily.     fluconazole (DIFLUCAN) 10 MG/ML suspension Take 42mL today, then 41mL daily for 20 more days. 220 mL 0   lidocaine (XYLOCAINE) 2 % solution Patient: Mix 1part 2% viscous lidocaine, 1part H20. Swish & swallow 83mL of diluted mixture, 22min before meals and at bedtime, up to QID 200 mL 3   lidocaine-prilocaine (EMLA) cream Apply to affected area once 30 g 3   lisinopril (ZESTRIL) 20 MG tablet Take 1 tablet by mouth daily.     magnesium oxide (MAG-OX) 400 (240 Mg) MG tablet Take 1 tablet (400 mg total) by mouth 2 (two) times daily. 14 tablet 0   Multiple Vitamins-Minerals (MULTIVITAMIN WOMEN 50+ PO) Take 1 tablet by mouth daily.     nicotine (NICODERM CQ - DOSED IN MG/24 HOURS) 21 mg/24hr patch Place 21 mg onto the skin daily.     Nutritional Supplements (KATE FARMS STANDARD 1.4) LIQD 1 Bottle by Enteral route 4 (four) times daily. 1300 mL 6   omeprazole (FIRST-OMEPRAZOLE) 2 mg/mL SUSP oral suspension Take 10 mLs (20 mg total) by mouth daily. 300 mL 0   ondansetron (ZOFRAN) 8 MG tablet Take 1 tablet (8 mg total) by mouth 2 (two) times daily as needed. Start on the third day after cisplatin chemotherapy. 30 tablet 1   Oxycodone HCl 10 MG TABS Take 1 tablet (10 mg total) by mouth every 12 (twelve) hours as needed (cancer pain). 60 tablet 0   potassium chloride (KLOR-CON) 10 MEQ tablet Take 10 mEq by  mouth daily.     predniSONE (DELTASONE) 20 MG tablet Take 20 mg by mouth daily as needed (COPD).     prochlorperazine (COMPAZINE) 10 MG tablet Take 1 tablet (10 mg total) by mouth every 6 (six) hours as needed (Nausea or vomiting). 30 tablet 1   scopolamine (TRANSDERM-SCOP) 1 MG/3DAYS Place 1 patch (1.5 mg total) onto the skin every 3 (three) days. 10 patch 1   sharps container Use as directed for sharps disposal     SPIRIVA RESPIMAT 2.5 MCG/ACT AERS Inhale 2 puffs into the lungs daily.     sucralfate (CARAFATE) 1 GM/10ML suspension Take 10 mLs (1 g total) by mouth 4 (four) times daily -  with meals and at bedtime. 420 mL 2   vitamin B-12 (CYANOCOBALAMIN) 500 MCG  tablet Take 500 mcg by mouth daily.     zolpidem (AMBIEN) 5 MG tablet Take 1 tablet (5 mg total) by mouth at bedtime as needed for sleep. 30 tablet 0   No current facility-administered medications for this visit.   Allergies  Allergen Reactions   Atorvastatin Other (See Comments)    Muscle pain     VITALS: BP 125/74 (BP Location: Right Arm, Patient Position: Sitting, Cuff Size: Normal)   Pulse 86   Temp 97.9 F (36.6 C) (Oral)   Wt 118 lb (53.5 kg)   BMI 19.05 kg/m    DENTAL EXAM: Oral hygiene (plaque):  Yes, generalized Mucositis:  No Description of saliva:  Thick, viscous saliva Exposed bone:  No Other watched areas:  #13 continues to be mobile/periodontally poor prognosis; #31 has fractured existing amalgam that is sensitive to biting and percussion.   RADIOGRAPHIC EXAM: 1 periapical exposed and interpreted.   Mild horizontal bone loss surrounding #31 & #32; #31 has   Missing tooth #30.  Fracture/missing portion of #31 existing amalgam (consistent with previous radiograph taken in 06/2020).  Periapical radiolucency evident on #31 & signs of one possibly developing at apex of #32; #32 has PDL space at apex that is difficult to follow.   ASSESSMENT/DIAGNOSES: Patient with history of radiation therapy to the head  and neck region.  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  Coupons given to the patient for CLoSYS over-the-counter product to try out for better relief.  Recommend to continue to take multiple sips of water as needed throughout the day. Dysgeusia:  Her taste has almost completely returned.  Weight Loss Accretions    PROCEDURES: Delivery of upper and lower fluoride trays. Appliances were tried in and adjusted as needed.  Polished. Postoperative instructions were provided in a written and verbal format concerning the use and care of appliances.   PLAN AND RECOMMENDATIONS: Brush after meals and at bedtime.  Use fluoride at bedtime. Use trismus exercises as directed. Use CLoSYS for dry mouth and salt water/baking soda rinses as needed. Take multiple sips of water as needed. Rx:  Sodium fluoride 1.1% gel to use with fluoride trays Return for routine dental care including cleanings, periodic exams and replacement of missing teeth as needed.  Plan to treat/extract teeth numbers 31 & 32 either at the hospital or refer out to oral surgeon.  This depends on whether or not they become symptomatic sooner rather than later.  Right now we plan to treatment plan/complete comprehensive exam and then begin indicated treatment as necessary.  The patient is agreeable to this plan and understands referral out for urgent extractions may be indicated before we can complete treatment here. Call if any problems or concerns arise.  All questions and concerns were invited and addressed.  The patient tolerated today's visit well and departed in stable condition.       [place] Benson [place], D.M.D.

## 2021-01-18 NOTE — Patient Instructions (Addendum)
Arpin Benson [place], D.M.D. Phone: [phone] Fax: [phone]   It was a pleasure seeing you today!  Please refer to the information below regarding your dental visit with [place].  Call us if any questions or concerns come up after you leave.   Thank you for giving [place] the opportunity to provide care for you.  If there is anything we can do for you, please let us know.    RECOMMENDATIONS: Brush after meals and at bedtime.  Floss once a day, and use fluoride at bedtime. Use trismus exercises as directed below. Use CLOSYs for dry mouth, and salt water/baking soda rinses to help with any mouth sores. Take multiple sips of water as needed.  Stay hydrated. Return to your regular dentist for routine dental care including cleanings and periodic exams.  If you do not have a regular dentist, it is important to establish care at an outside dental office of your choice.  Call us if any problems or concerns arise.    TRISMUS: Trismus is a condition where the jaw does not allow the mouth to open as wide as it usually does.  This can happen almost suddenly, or in other cases the process is so slow, it is hard to notice it-until it is too far along.  When the jaw joints and/or muscles have been exposed to radiation treatments, the onset of trismus is very slow.  This is because the muscles are losing their stretching ability over a long period of time, as long as 2 YEARS after the end of radiation.  It is therefore important to exercise these muscles and joints.  Trismus exercises: Use the stack of tongue depressors measuring the same or a little less than your maximum opening that was recorded at your first dental visit. Place the stack in your mouth, supporting the other end with your hand. Allow 30 seconds for muscle stretching. Rest for a few seconds. Repeat 3-5 times. For all radiation patients, this exercise is recommended in the mornings and evenings  unless otherwise instructed. The exercises should be done for a period of 2 YEARS after the end of radiation. Maximum jaw opening should be checked routinely on recall dental visits by your general dentist. Report any changes, soreness, or difficulties encountered when doing the exercises to your dentist.    [place]   How do I use my fluoride trays? The best time to use your Fluoride is in the morning after breakfast, or right before bed time.  You must brush your teeth very well and floss before using the Fluoride in order to get the best use out of the Fluoride treatments. Place 1 drop of Fluoride in each tooth space in the tray.  You do not need to use more. Place the tray(s) over your teeth.  Make sure the trays are seated all the way.  Remember, they only fit one way on your teeth. Clench your teeth together a few times with light chewing motions to distribute the fluoride. Leave the trays in place for a full 5 minutes.  You may drool a bit, so keep a tissue handy. At the end of the 5 minutes, take the trays out.  SPIT OUT the remaining fluoride, but DO NOT RINSE. DO NOT eat or drink after treatments for at least 30 minutes.  This is why the best time for your treatments is before bedtime.  How do I care for my fluoride trays? Clean the inside of  your Fluoride trays using cold water and a toothbrush. In order to keep your trays from discoloring and free from odors, soak them overnight in denture cleaners or soap and water.  Do not use bleach or non-denture products. Store the trays in a safe dry place AWAY from any heat until your next treatment.  Finally, be sure to let your pharmacy know when you are close to needing a new refill for your fluoride prescription so they have it ready for you without interruption of fluoride use.    QUESTIONS? If anything happens to your Fluoride trays, or they don't fit as well after any dental work, please let us know as soon as  possible.  Call the [facility] to schedule an appointment at [phone].

## 2021-01-24 ENCOUNTER — Encounter (HOSPITAL_COMMUNITY): Payer: Self-pay | Admitting: Dentistry

## 2021-01-25 ENCOUNTER — Encounter: Payer: Self-pay | Admitting: Nurse Practitioner

## 2021-01-26 MED ORDER — SODIUM FLUORIDE 1.1 % DT GEL: 1.0000 "application " | Freq: Every day | DENTAL | 11 refills | Status: AC

## 2021-02-01 ENCOUNTER — Other Ambulatory Visit: Payer: Self-pay | Admitting: Radiation Oncology

## 2021-02-01 DIAGNOSIS — C109 Malignant neoplasm of oropharynx, unspecified: Secondary | ICD-10-CM

## 2021-02-01 MED ORDER — ZOLPIDEM TARTRATE 5 MG PO TABS: 5.0000 mg | ORAL_TABLET | Freq: Every evening | ORAL | 0 refills | Status: DC | PRN

## 2021-02-04 ENCOUNTER — Other Ambulatory Visit (HOSPITAL_COMMUNITY): Payer: Medicare Other | Admitting: Dentistry

## 2021-02-08 ENCOUNTER — Encounter (HOSPITAL_COMMUNITY): Payer: Self-pay | Admitting: Dentistry

## 2021-02-08 NOTE — Progress Notes (Signed)
Cathy Knight NOTE  Patient Care Team: Alphia Moh, MD as PCP - General (Family Medicine) Malmfelt, Stephani Police, RN as Oncology Nurse Navigator Tyler Pita, MD as Referring Physician (Otolaryngology) Eppie Gibson, MD as Consulting Physician (Radiation Oncology) Benay Pike, MD as Consulting Physician (Hematology and Oncology)  CHIEF COMPLAINTS/PURPOSE OF CONSULTATION:  Tonsillar cancer follow up after completion of chemoradiation.  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 She received 6 out of 7 planned cycles of chemotherapy.  Last treatment on September 07, 2020.  She is here for follow-up after completion of chemoradiation. End of treatment PET in November with no evidence of disease. At this time there is no concern for recurrent disease on physical exam.  She is however encouraged to continue follow-up with [place] at least every 4 weeks since she has G-tube.  Labs reviewed and no concerns. She should follow-up with ENT for periodic checkups and once the G-tube is removed can follow-up with medical oncology every 6 months to a year with labs. She expressed understanding of the recommendations.  Weight loss She continues to have an indwelling G-tube at this time.  She has been using [employer] for supplement.  Weight since last visit has not improved however remained stable.  She mentions that her oral intake has significantly improved.  We will continue the G-tube at this time and monitor her weight in about 4 weeks.  She is agreeable to this approach.  Neuropathy due to medical condition [facility]) She does complain of new tingling and numbness after completion of chemotherapy and radiation.  She has had neuropathic pain in the past and is  reluctant to try any gabapentin.  Since she is a heavy smoker, I have also recommended continuing follow-up with her vascular specialist for possible vascular etiology for this neuropathy and she is agreeable. Will continue to follow-up on this.  Although cisplatin can cause neuropathy, the timing of presentation is atypical at this time.  She understands that cisplatin can contribute to neuropathy.  No orders of the defined types were placed in this encounter.    HISTORY OF PRESENTING ILLNESS:   Cathy Knight 70 y.o. female is here because of SCC oropharynx, P 16 +  Chronology  This is a very pleasant 70 year old female patient with newly diagnosed oropharyngeal cancer referred to medical oncology for recommendations.  During her last visit we discussed about concurrent chemoradiation given bilateral disease. She completed radiation on September 23, 2020. Cycle 6 of chemotherapy completed on September 07, 2020.   Cycle 7 of chemotherapy omitted because of worsening performance status and poor tolerance. She completed radiation on 09/23/2020 PET scan results from 11/3 2022, no evidence of disease.  Interval History  She is here for follow-up by herself, Since last visit, no new complaints.  She has been tolerating D[employer] supplement much better.  She has been eating better.  She has not lost any weight in the past month.  She is reluctant to try the nicotine patches, she is not motivated to quit smoking at this time. Continues to smoke heavily. No nausea or vomiting.  No diarrhea, very rarely has to use an Imodium. Sorethroat is more tolerable, she has stopped taking all pain medication. No hearing loss her blood pressure medication has been modified and  since then she has felt much better in terms of mood.  She denies any depression at this time.  No hearing loss.  She does report some tingling and numbness in her feet.  She wonders if this is vascular in nature, has an appointment for checkup  at [facility] vascular specialist.  She has used gabapentin in the past for some neuropathy pain and she is reluctant to try anything at this time. She is not very diligent with her swallowing exercises, continues to follow-up with speech and nutrition at this time. Rest of the pertinent 10 point ROS reviewed and negative.  MEDICAL HISTORY:  Past Medical History:  Diagnosis Date   Cancer ([place])    Hypertension     SURGICAL HISTORY: Past Surgical History:  Procedure Laterality Date   IR CM INJ ANY COLONIC TUBE W/FLUORO  08/09/2020   IR GASTROSTOMY TUBE MOD SED  07/28/2020   IR IMAGING GUIDED PORT INSERTION  07/28/2020    SOCIAL HISTORY: Social History   Socioeconomic History   Marital status: Single    Spouse name: Not on file   Number of children: Not on file   Years of education: Not on file   Highest education level: Not on file  Occupational History   Not on file  Tobacco Use   Smoking status: Every Day    Packs/day: 1.00    Years: 15.00    Pack years: 15.00    Types: Cigarettes   Smokeless tobacco: Never  Substance and Sexual Activity   Alcohol use: Not on file   Drug use: Not on file   Sexual activity: Not on file  Other Topics Concern   Not on file  Social History Narrative   Not on file   Social Determinants of Health   Financial Resource Strain: Low Risk    Difficulty of Paying Living Expenses: Not very hard  Food Insecurity: No Food Insecurity   Worried About Running Out of Food in the Last Year: Never true   Ran Out of Food in the Last Year: Never true  Transportation Needs: No Transportation Needs   Lack of Transportation (Medical): No   Lack of Transportation (Non-Medical): No  Physical Activity: Not on file  Stress: Not on file  Social Connections: Not on file  Intimate Partner Violence: Not At Risk   Fear of Current or Ex-Partner: No   Emotionally Abused: No   Physically Abused: No   Sexually Abused: No    FAMILY HISTORY: No family history on  file.  ALLERGIES:  is allergic to atorvastatin.  MEDICATIONS:  Current Outpatient Medications  Medication Sig Dispense Refill   albuterol (VENTOLIN HFA) 108 (90 Base) MCG/ACT inhaler Inhale 1-2 puffs into the lungs every 6 (six) hours as needed for wheezing or shortness of breath.     amLODipine (NORVASC) 5 MG tablet Take 5 mg by mouth daily.     aspirin 81 MG chewable tablet Chew 81 mg by mouth daily.     calcium citrate-vitamin D (CITRACAL+D) 315-200 MG-UNIT tablet Take 1 tablet by mouth 2 (two) times daily.     carvedilol (COREG) 6.25 MG tablet Take 6.25 mg by mouth 2 (two) times daily with a meal.     Cholecalciferol (VITAMIN D3) 125 MCG (5000 UT) CAPS Take 5,000 Units by mouth daily.     co-enzyme Q-10 30 MG capsule Take 30 mg by mouth daily.     Evolocumab (REPATHA SURECLICK) 283 MG/ML SOAJ Inject 140 mg into the skin every  14 (fourteen) days.     Flaxseed, Linseed, (FLAXSEED OIL) 1000 MG CAPS Take 2,000 mg by mouth daily.     lisinopril (ZESTRIL) 20 MG tablet Take 0.5 tablets by mouth daily.     Multiple Vitamins-Minerals (MULTIVITAMIN WOMEN 50+ PO) Take 1 tablet by mouth daily.     nicotine (NICODERM CQ - DOSED IN MG/24 HOURS) 21 mg/24hr patch Place 21 mg onto the skin daily.     Nutritional Supplements (KATE FARMS STANDARD 1.4) LIQD 1 Bottle by Enteral route 4 (four) times daily. 1300 mL 6   omeprazole (FIRST-OMEPRAZOLE) 2 mg/mL SUSP oral suspension Take 10 mLs (20 mg total) by mouth daily. 300 mL 0   potassium chloride (KLOR-CON) 10 MEQ tablet Take 10 mEq by mouth daily.     sharps container Use as directed for sharps disposal     sodium fluoride (SODIUM FLUORIDE 5000 PPM) 1.1 % GEL dental gel Place 1 application onto teeth at bedtime. Place 1 pea-size drop into each tooth space of fluoride trays once a day at bedtime.  Leave trays in for 5 minutes and then remove.  Spit out excess fluoride, but DO NOT rinse with water, eat or drink for at least 30 minutes after use. 120 mL 11    SPIRIVA RESPIMAT 2.5 MCG/ACT AERS Inhale 2 puffs into the lungs daily.     vitamin B-12 (CYANOCOBALAMIN) 500 MCG tablet Take 500 mcg by mouth daily.     zolpidem (AMBIEN) 5 MG tablet Take 1 tablet (5 mg total) by mouth at bedtime as needed for sleep. 10 tablet 0   No current facility-administered medications for this visit.    PHYSICAL EXAMINATION:  ECOG PERFORMANCE STATUS: 1 - Symptomatic but completely ambulatory  Vitals:   02/09/21 1124  BP: (!) 141/84  Pulse: 96  Resp: 17  Temp: (!) 97.5 F (36.4 C)  SpO2: 100%    Physical Exam Constitutional:      Appearance: Normal appearance.  HENT:     Head: Normocephalic and atraumatic.     Mouth/Throat:     Mouth: Mucous membranes are dry.     Pharynx: No oropharyngeal exudate or posterior oropharyngeal erythema.     Comments: Nicotine staining on the tongue Cardiovascular:     Rate and Rhythm: Normal rate and regular rhythm.     Pulses: Normal pulses.     Heart sounds: Normal heart sounds.  Pulmonary:     Effort: Pulmonary effort is normal.     Breath sounds: Normal breath sounds.  Abdominal:     General: Abdomen is flat. Bowel sounds are normal.     Palpations: Abdomen is soft.  Musculoskeletal:        General: No swelling or tenderness.     Cervical back: Normal range of motion and neck supple.  Lymphadenopathy:     Cervical: No cervical adenopathy.  Skin:    General: Skin is warm and dry.     Findings: No erythema or rash.  Neurological:     General: No focal deficit present.     Mental Status: She is alert.  Psychiatric:        Mood and Affect: Mood normal.   LABORATORY DATA:    Lab Results  Component Value Date   WBC 6.2 02/09/2021   HGB 12.7 02/09/2021   HCT 37.4 02/09/2021   MCV 98.2 02/09/2021   PLT 230 02/09/2021     Chemistry      Component Value Date/Time   NA 138 02/09/2021  1102   K 3.5 02/09/2021 1102   CL 104 02/09/2021 1102   CO2 24 02/09/2021 1102   BUN 11 02/09/2021 1102   CREATININE  1.02 (H) 02/09/2021 1102      Component Value Date/Time   CALCIUM 9.0 02/09/2021 1102   ALKPHOS 55 07/13/2020 1239   AST 14 (L) 07/13/2020 1239   ALT 8 07/13/2020 1239   BILITOT 0.3 07/13/2020 1239       In situ study for HPV 16/18 is negative. Slides will ne sent to [facility] for a broader HR HPV panel  Addendum electronically signed by Isac Caddy Askin, MD on 06/18/2020 at  5:04 PM  Addendum    The p16 immunostain is positive. HR HPV study ordered.  Addendum electronically signed by Isac Caddy Askin, MD on 06/18/2020 at  1:57 PM  Diagnosis    A: Throat, right, biopsy - Fragments of keratinizing well differentiated squamous carcinoma.  A single focus of submucosa with invasive carcinoma is present   Addendum 3    Noted 07/01/2000: Tissue was sent to [facility] for a broad HR HPV ISH study.  Results were: Negative.  The controls were appropriate     RADIOGRAPHIC STUDIES: I have personally reviewed the radiological images as listed and agreed with the findings in the report.   No results found.    Benay Pike, MD 02/09/2021 12:00 PM

## 2021-02-09 ENCOUNTER — Other Ambulatory Visit: Payer: Self-pay

## 2021-02-09 ENCOUNTER — Inpatient Hospital Stay (HOSPITAL_BASED_OUTPATIENT_CLINIC_OR_DEPARTMENT_OTHER): Payer: Medicare Other | Admitting: Hematology and Oncology

## 2021-02-09 ENCOUNTER — Inpatient Hospital Stay: Payer: Medicare Other | Admitting: Dietician

## 2021-02-09 ENCOUNTER — Encounter: Payer: Self-pay | Admitting: Hematology and Oncology

## 2021-02-09 ENCOUNTER — Inpatient Hospital Stay: Payer: Medicare Other | Attending: Hematology and Oncology

## 2021-02-09 DIAGNOSIS — Z79899 Other long term (current) drug therapy: Secondary | ICD-10-CM | POA: Insufficient documentation

## 2021-02-09 DIAGNOSIS — Z9221 Personal history of antineoplastic chemotherapy: Secondary | ICD-10-CM | POA: Diagnosis not present

## 2021-02-09 DIAGNOSIS — F17210 Nicotine dependence, cigarettes, uncomplicated: Secondary | ICD-10-CM | POA: Diagnosis not present

## 2021-02-09 DIAGNOSIS — G629 Polyneuropathy, unspecified: Secondary | ICD-10-CM | POA: Insufficient documentation

## 2021-02-09 DIAGNOSIS — G63 Polyneuropathy in diseases classified elsewhere: Secondary | ICD-10-CM

## 2021-02-09 DIAGNOSIS — C109 Malignant neoplasm of oropharynx, unspecified: Secondary | ICD-10-CM | POA: Diagnosis present

## 2021-02-09 DIAGNOSIS — Z923 Personal history of irradiation: Secondary | ICD-10-CM | POA: Insufficient documentation

## 2021-02-09 DIAGNOSIS — R634 Abnormal weight loss: Secondary | ICD-10-CM | POA: Diagnosis not present

## 2021-02-09 DIAGNOSIS — C090 Malignant neoplasm of tonsillar fossa: Secondary | ICD-10-CM | POA: Diagnosis present

## 2021-02-09 LAB — CBC WITH DIFFERENTIAL (CANCER CENTER ONLY)
Abs Immature Granulocytes: 0.02 10*3/uL (ref 0.00–0.07)
Basophils Absolute: 0 10*3/uL (ref 0.0–0.1)
Basophils Relative: 1 %
Eosinophils Absolute: 0 10*3/uL (ref 0.0–0.5)
Eosinophils Relative: 1 %
HCT: 37.4 % (ref 36.0–46.0)
Hemoglobin: 12.7 g/dL (ref 12.0–15.0)
Immature Granulocytes: 0 %
Lymphocytes Relative: 10 %
Lymphs Abs: 0.6 10*3/uL — ABNORMAL LOW (ref 0.7–4.0)
MCH: 33.3 pg (ref 26.0–34.0)
MCHC: 34 g/dL (ref 30.0–36.0)
MCV: 98.2 fL (ref 80.0–100.0)
Monocytes Absolute: 0.5 10*3/uL (ref 0.1–1.0)
Monocytes Relative: 8 %
Neutro Abs: 4.9 10*3/uL (ref 1.7–7.7)
Neutrophils Relative %: 80 %
Platelet Count: 230 10*3/uL (ref 150–400)
RBC: 3.81 MIL/uL — ABNORMAL LOW (ref 3.87–5.11)
RDW: 17 % — ABNORMAL HIGH (ref 11.5–15.5)
WBC Count: 6.2 10*3/uL (ref 4.0–10.5)
nRBC: 0 % (ref 0.0–0.2)

## 2021-02-09 LAB — BASIC METABOLIC PANEL - CANCER CENTER ONLY
Anion gap: 10 (ref 5–15)
BUN: 11 mg/dL (ref 8–23)
CO2: 24 mmol/L (ref 22–32)
Calcium: 9 mg/dL (ref 8.9–10.3)
Chloride: 104 mmol/L (ref 98–111)
Creatinine: 1.02 mg/dL — ABNORMAL HIGH (ref 0.44–1.00)
GFR, Estimated: 60 mL/min — ABNORMAL LOW (ref >60)
Glucose, Bld: 76 mg/dL (ref 70–99)
Potassium: 3.5 mmol/L (ref 3.5–5.1)
Sodium: 138 mmol/L (ref 135–145)

## 2021-02-09 LAB — MAGNESIUM: Magnesium: 1.8 mg/dL (ref 1.7–2.4)

## 2021-02-09 NOTE — Assessment & Plan Note (Signed)
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 She received 6 out of 7 planned cycles of chemotherapy.  Last treatment on September 07, 2020.  She is here for follow-up after completion of chemoradiation. End of treatment PET in November with no evidence of disease. At this time there is no concern for recurrent disease on physical exam.  She is however encouraged to continue follow-up with [place] at least every 4 weeks since she has G-tube.  Labs reviewed and no concerns. She should follow-up with ENT for periodic checkups and once the G-tube is removed can follow-up with medical oncology every 6 months to a year with labs. She expressed understanding of the recommendations.

## 2021-02-09 NOTE — Assessment & Plan Note (Signed)
She does complain of new tingling and numbness after completion of chemotherapy and radiation.  She has had neuropathic pain in the past and is reluctant to try any gabapentin.  Since she is a heavy smoker, I have also recommended continuing follow-up with her vascular specialist for possible vascular etiology for this neuropathy and she is agreeable. Will continue to follow-up on this.  Although cisplatin can cause neuropathy, the timing of presentation is atypical at this time.  She understands that cisplatin can contribute to neuropathy.

## 2021-02-09 NOTE — Progress Notes (Signed)
Nutrition Follow-up:  Patient has completed treatment for cancer of oropharynx  Met with patient in office this afternoon. She reports "roaring" appetite. Patient reports chronic fatigue, but relates this to increased physical activity with house renovations. Patient continues to have dry mouth, reports difficulty swallowing breads, cookies, fries. This has been disappointing for her. Patient craving burger from [employer], pizza, donuts, cookies. She is eating lots of soups, chili, potatoes, gravy, broth, grits, salads with lots of dressings. Patient reports tolerating warm foods better than cold. She is drinking ~48 oz water and decaf coffee. Patient does not like the aftertaste of milk and does not like any oral nutrition supplement. Patient reports trying a [employer] breakfast smoothie which she liked, but it was a little too thick. These are also expensive and could not afford to drink everyday. Patient is supplementing with tube feedings 2-3 times/week. She reports using when she knows she is out for an appointment knowing she will not be home for lunch. Patient is flushing tube daily with water. Patient denies nausea, vomiting, diarrhea, constipation.    Medications: reviewed   Labs: reviewed  Anthropometrics: Weight 116 lb 2 oz today stable x 1 month. Patient weighed 117 lb on 11/4 decreased 12.8% from 132 lb 6 oz on 9/29  9/13 - 137 lb 1.6 oz 8/16 - 141 lb 4.8 oz  NUTRITION DIAGNOSIS: Inadequate oral intake improving, pt continues to rely on supplemental tube feedings    INTERVENTION:  Discussed strategies for increasing calories and protein orally Patient will have bedtime snack Discussed packing a snack bag to keep on hand for long appointment days verses giving a tube feeding Continue HEP exercises as prescribed per SLP Provided support and encouragement to increase oral intake with goal of PEG removal Patient has contact information     MONITORING, EVALUATION, GOAL: weight  trends, intake    NEXT VISIT: f/u for weight check Monday January 16 with Pamala Hurry

## 2021-02-09 NOTE — Assessment & Plan Note (Signed)
She continues to have an indwelling G-tube at this time.  She has been using [employer] for supplement.  Weight since last visit has not improved however remained stable.  She mentions that her oral intake has significantly improved.  We will continue the G-tube at this time and monitor her weight in about 4 weeks.  She is agreeable to this approach.

## 2021-02-10 ENCOUNTER — Other Ambulatory Visit: Payer: Self-pay | Admitting: Dentistry

## 2021-02-10 MED ORDER — AMOXICILLIN 500 MG PO CAPS: 500.0000 mg | ORAL_CAPSULE | Freq: Three times a day (TID) | ORAL | 0 refills | Status: AC

## 2021-03-04 ENCOUNTER — Other Ambulatory Visit (HOSPITAL_COMMUNITY): Payer: Medicare Other | Admitting: Dentistry

## 2021-03-09 ENCOUNTER — Ambulatory Visit: Admit: 2021-03-09 | Discharge: 2021-03-10 | Payer: MEDICARE

## 2021-03-09 ENCOUNTER — Telehealth: Payer: Self-pay | Admitting: Radiation Oncology

## 2021-03-09 DIAGNOSIS — F17200 Nicotine dependence, unspecified, uncomplicated: Principal | ICD-10-CM

## 2021-03-09 DIAGNOSIS — J449 Chronic obstructive pulmonary disease, unspecified: Principal | ICD-10-CM

## 2021-03-09 MED ORDER — PREDNISONE 20 MG TABLET
ORAL_TABLET | Freq: Every day | ORAL | 0 refills | 5 days | Status: CP
Start: 2021-03-09 — End: ?

## 2021-03-09 MED ORDER — FLUTICASONE PROPIONATE 50 MCG/ACTUATION NASAL SPRAY,SUSPENSION
Freq: Every day | NASAL | 0 refills | 123.00000 days | Status: CP
Start: 2021-03-09 — End: 2022-03-09

## 2021-03-09 NOTE — Telephone Encounter (Signed)
Received request from [place] for isodose diagram via fax. Placed request on cart to go to dosimetry.

## 2021-03-14 ENCOUNTER — Inpatient Hospital Stay: Payer: Medicare Other | Attending: Hematology and Oncology

## 2021-03-14 ENCOUNTER — Other Ambulatory Visit: Payer: Self-pay

## 2021-03-14 ENCOUNTER — Inpatient Hospital Stay: Payer: Medicare Other | Admitting: Nutrition

## 2021-03-14 ENCOUNTER — Inpatient Hospital Stay: Payer: Medicare Other

## 2021-03-14 ENCOUNTER — Encounter: Payer: Self-pay | Admitting: Hematology and Oncology

## 2021-03-14 ENCOUNTER — Inpatient Hospital Stay (HOSPITAL_BASED_OUTPATIENT_CLINIC_OR_DEPARTMENT_OTHER): Payer: Medicare Other | Admitting: Hematology and Oncology

## 2021-03-14 VITALS — BP 120/63 | HR 107 | Temp 97.9°F | Resp 18 | Wt 111.0 lb

## 2021-03-14 DIAGNOSIS — R634 Abnormal weight loss: Secondary | ICD-10-CM

## 2021-03-14 DIAGNOSIS — C109 Malignant neoplasm of oropharynx, unspecified: Secondary | ICD-10-CM

## 2021-03-14 DIAGNOSIS — Z79899 Other long term (current) drug therapy: Secondary | ICD-10-CM | POA: Insufficient documentation

## 2021-03-14 DIAGNOSIS — Z431 Encounter for attention to gastrostomy: Secondary | ICD-10-CM | POA: Insufficient documentation

## 2021-03-14 DIAGNOSIS — F17210 Nicotine dependence, cigarettes, uncomplicated: Secondary | ICD-10-CM | POA: Diagnosis not present

## 2021-03-14 DIAGNOSIS — C090 Malignant neoplasm of tonsillar fossa: Secondary | ICD-10-CM | POA: Insufficient documentation

## 2021-03-14 DIAGNOSIS — Z720 Tobacco use: Secondary | ICD-10-CM

## 2021-03-14 LAB — CBC WITH DIFFERENTIAL (CANCER CENTER ONLY)
Abs Immature Granulocytes: 0.03 10*3/uL (ref 0.00–0.07)
Basophils Absolute: 0.1 10*3/uL (ref 0.0–0.1)
Basophils Relative: 1 %
Eosinophils Absolute: 0.1 10*3/uL (ref 0.0–0.5)
Eosinophils Relative: 1 %
HCT: 37.6 % (ref 36.0–46.0)
Hemoglobin: 12.5 g/dL (ref 12.0–15.0)
Immature Granulocytes: 0 %
Lymphocytes Relative: 11 %
Lymphs Abs: 0.9 10*3/uL (ref 0.7–4.0)
MCH: 34.4 pg — ABNORMAL HIGH (ref 26.0–34.0)
MCHC: 33.2 g/dL (ref 30.0–36.0)
MCV: 103.6 fL — ABNORMAL HIGH (ref 80.0–100.0)
Monocytes Absolute: 0.4 10*3/uL (ref 0.1–1.0)
Monocytes Relative: 5 %
Neutro Abs: 6.5 10*3/uL (ref 1.7–7.7)
Neutrophils Relative %: 82 %
Platelet Count: 275 10*3/uL (ref 150–400)
RBC: 3.63 MIL/uL — ABNORMAL LOW (ref 3.87–5.11)
RDW: 17 % — ABNORMAL HIGH (ref 11.5–15.5)
WBC Count: 7.9 10*3/uL (ref 4.0–10.5)
nRBC: 0 % (ref 0.0–0.2)

## 2021-03-14 LAB — BASIC METABOLIC PANEL - CANCER CENTER ONLY
Anion gap: 8 (ref 5–15)
BUN: 12 mg/dL (ref 8–23)
CO2: 28 mmol/L (ref 22–32)
Calcium: 9.4 mg/dL (ref 8.9–10.3)
Chloride: 103 mmol/L (ref 98–111)
Creatinine: 1.18 mg/dL — ABNORMAL HIGH (ref 0.44–1.00)
GFR, Estimated: 50 mL/min — ABNORMAL LOW (ref >60)
Glucose, Bld: 91 mg/dL (ref 70–99)
Potassium: 3.5 mmol/L (ref 3.5–5.1)
Sodium: 139 mmol/L (ref 135–145)

## 2021-03-14 LAB — MAGNESIUM: Magnesium: 1.8 mg/dL (ref 1.7–2.4)

## 2021-03-14 NOTE — Assessment & Plan Note (Signed)
In her last visit, we expressed concerns about ongoing weight loss and recommended to return to clinic in 4 weeks to monitor her weight.  Since last visit she continues to lose weight, tells me that she had active COVID infection and some tooth infection and hence she was not able to eat much food. She adamantly wants to get the G-tube out at this point, she tells me that it has been very bothersome. She understands that if she needs it again, she will have to undergo another procedure to get the G-tube.  She still would like to take a chance and have this removed.  Order has been placed for removal of G-tube.  She was instructed to follow-up with nutrition every 4 weeks and be diligent about the recommendations in order to be able to gain weight.  She can return to clinic with [place] in 6 months.

## 2021-03-14 NOTE — Assessment & Plan Note (Signed)
We have discussed that smoking will continue to increase her risk of second primaries and possibly her risk of recurrence.  She understands this and at this time she is not willing to quit smoking.

## 2021-03-14 NOTE — Progress Notes (Signed)
Nutrition follow-up completed with patient after MD visit.  Patient is status post completion of treatment for cancer of the oropharynx.  Weight documented as 111 pounds January 16 which is decreased from 116.13 pounds on December 14.  Patient states she had COVID and several teeth extracted between those 2 visits and she attributes weight loss to this. Reports she is tolerating most soft foods.  She is using her crock pot to cook many of her foods.  She does not usually tolerate cold foods.  Her taste has returned for the most part.  She does report a dry mouth.  She states her feeding tube is bothersome and MD has agreed to remove it.  She is confident she can eat enough calories and protein to minimize further weight loss.  Nutrition diagnosis: Inadequate oral intake has increased. (Due to Covid and 3 teeth extracted)  Intervention: Reviewed high-calorie, high-protein foods. Reviewed warm supplements that are high in calories and protein since patient tolerates warm foods better than cold foods. Reviewed ways to prepare a nutrition supplement. Provided sample of [employer] breakfast essentials.  Also provided patient with powdered milk. Stressed importance of patient eating every 3 hours. Provided nutrition fact sheet.  Questions were answered.  Teach back method used.  Monitoring, evaluation, goals: Patient will work to increase oral intake to improve weight.  Feeding tube is being removed so no nutrition follow-ups have been scheduled at this time.  Patient agrees to contact RD with questions or concerns.  **Disclaimer: This note was dictated with voice recognition software. Similar sounding words can inadvertently be transcribed and this note may contain transcription errors which may not have been corrected upon publication of note.**

## 2021-03-14 NOTE — Assessment & Plan Note (Signed)
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 She received 6 out of 7 planned cycles of chemotherapy.  Last treatment on September 07, 2020.  She is here for follow-up after completion of chemoradiation. End of treatment PET in November with no evidence of disease. At this time there is no concern for recurrent disease on physical exam.   She will follow-up with [facility]. Frazier Butt at [facility] for monitoring.  We have tried to add on TSH to her current labs, unfortunately there is no gold top drawn to add it on hence she will have to go back to the lab.  Informed her nutritionist who will be seeing her today as well about resending the patient to the lab.  She can return to clinic with medical oncology in 6 months.

## 2021-03-14 NOTE — Progress Notes (Signed)
Cathy Knight  Patient Care Team: Alphia Moh, MD as PCP - General (Family Medicine) Malmfelt, Stephani Police, RN as Oncology Nurse Navigator Tyler Pita, MD as Referring Physician (Otolaryngology) Eppie Gibson, MD as Consulting Physician (Radiation Oncology) Benay Pike, MD as Consulting Physician (Hematology and Oncology)  CHIEF COMPLAINTS/PURPOSE OF CONSULTATION:  Tonsillar cancer follow up after completion of chemoradiation.  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 She received 6 out of 7 planned cycles of chemotherapy.  Last treatment on September 07, 2020.  She is here for follow-up after completion of chemoradiation. End of treatment PET in November with no evidence of disease. At this time there is no concern for recurrent disease on physical exam.   She will follow-up with Dr. Frazier [facility]t [facility] for monitoring.  We have tried to add on TSH to her current labs, unfortunately there is no gold top drawn to add it on hence she will have to go back to the lab.  Informed her nutritionist who will be seeing her today as well about resending the patient to the lab.  She can return to clinic with medical oncology in 6 months.  Weight loss In her last visit, we expressed concerns about ongoing weight loss and recommended to return to clinic in 4 weeks to monitor her weight.  Since last visit she continues to lose weight, tells me that she had active COVID infection and some tooth infection and hence she was not able to eat much food. She adamantly wants to get the G-tube out at this point, she tells me that it has been very bothersome. She understands that if she needs it again, she will have to undergo another procedure to get  the G-tube.  She still would like to take a chance and have this removed.  Order has been placed for removal of G-tube.  She was instructed to follow-up with nutrition every 4 weeks and be diligent about the recommendations in order to be able to gain weight.  She can return to clinic with [place] in 6 months.  Declined smoking cessation We have discussed that smoking will continue to increase her risk of second primaries and possibly her risk of recurrence.  She understands this and at this time she is not willing to quit smoking.  Orders Placed This Encounter  Procedures   IR GASTROSTOMY TUBE REMOVAL    Standing Status:   Future    Standing Expiration Date:   03/14/2022    Order Specific Question:   Reason for Exam (SYMPTOM  OR DIAGNOSIS REQUIRED)    Answer:   chemo completed.    Order Specific Question:   Preferred Imaging Location?    Answer:   [facility]   TSH    Standing Status:   Standing    Number of Occurrences:   22    Standing Expiration Date:   03/14/2022      HISTORY OF PRESENTING ILLNESS:   Cathy Knight 71 y.o. female is here because of SCC oropharynx, P 16 +  Chronology  This is a very pleasant 71 year old female patient with newly diagnosed oropharyngeal cancer referred to medical oncology for recommendations.  During her last visit we discussed about concurrent chemoradiation given bilateral disease. She completed radiation on  September 23, 2020. Cycle 6 of chemotherapy completed on September 07, 2020.   Cycle 7 of chemotherapy omitted because of worsening performance status and poor tolerance. She completed radiation on 09/23/2020 PET scan results from 11/3 2022, no evidence of disease.  Interval History  She is here for follow-up by herself.  She says she is eating a lot, she wants to get rid of G tube. She hasnt been using G tube She is not sure about the amount of calories she has been consuming.  She tells me that she was sick with COVID and another tooth  infection in the past 3 to 4 weeks and hence she lost weight because she was feeling very sick.  She understands that usually we do not recommend taking the G-tube out and patient is continue to lose weight but she is pretty stubborn about having it out.  She tells me that it has been very bothersome to her and she has not been using it in the past 2 to 3 weeks except for once or twice.  She continues to smoke heavily, she clearly understands the risks of cancer recurrence and second primaries.  She had to reschedule her ENT follow-up with Dr. Frazier Butt at [facility] since she had active COVID infection which has now resolved.  She has follow up with nutrition today. Rest of the pertinent 10 point ROS reviewed and negative.  MEDICAL HISTORY:  Past Medical History:  Diagnosis Date   Cancer ([place])    Hypertension     SURGICAL HISTORY: Past Surgical History:  Procedure Laterality Date   IR CM INJ ANY COLONIC TUBE W/FLUORO  08/09/2020   IR GASTROSTOMY TUBE MOD SED  07/28/2020   IR IMAGING GUIDED PORT INSERTION  07/28/2020    SOCIAL HISTORY: Social History   Socioeconomic History   Marital status: Single    Spouse name: Not on file   Number of children: Not on file   Years of education: Not on file   Highest education level: Not on file  Occupational History   Not on file  Tobacco Use   Smoking status: Every Day    Packs/day: 1.00    Years: 15.00    Pack years: 15.00    Types: Cigarettes   Smokeless tobacco: Never  Substance and Sexual Activity   Alcohol use: Not on file   Drug use: Not on file   Sexual activity: Not on file  Other Topics Concern   Not on file  Social History Narrative   Not on file   Social Determinants of Health   Financial Resource Strain: Low Risk    Difficulty of Paying Living Expenses: Not very hard  Food Insecurity: No Food Insecurity   Worried About Running Out of Food in the Last Year: Never true   Ran Out of Food in the Last Year: Never true   Transportation Needs: No Transportation Needs   Lack of Transportation (Medical): No   Lack of Transportation (Non-Medical): No  Physical Activity: Not on file  Stress: Not on file  Social Connections: Not on file  Intimate Partner Violence: Not At Risk   Fear of Current or Ex-Partner: No   Emotionally Abused: No   Physically Abused: No   Sexually Abused: No    FAMILY HISTORY: No family history on file.  ALLERGIES:  is allergic to atorvastatin.  MEDICATIONS:  Current Outpatient Medications  Medication Sig Dispense Refill   albuterol (VENTOLIN HFA) 108 (90 Base) MCG/ACT inhaler Inhale 1-2  puffs into the lungs every 6 (six) hours as needed for wheezing or shortness of breath.     amLODipine (NORVASC) 5 MG tablet Take 5 mg by mouth daily.     aspirin 81 MG chewable tablet Chew 81 mg by mouth daily.     calcium citrate-vitamin D (CITRACAL+D) 315-200 MG-UNIT tablet Take 1 tablet by mouth 2 (two) times daily.     carvedilol (COREG) 6.25 MG tablet Take 6.25 mg by mouth 2 (two) times daily with a meal.     Cholecalciferol (VITAMIN D3) 125 MCG (5000 UT) CAPS Take 5,000 Units by mouth daily.     co-enzyme Q-10 30 MG capsule Take 30 mg by mouth daily.     Evolocumab (REPATHA SURECLICK) 119 MG/ML SOAJ Inject 140 mg into the skin every 14 (fourteen) days.     Flaxseed, Linseed, (FLAXSEED OIL) 1000 MG CAPS Take 2,000 mg by mouth daily.     lisinopril (ZESTRIL) 20 MG tablet Take 0.5 tablets by mouth daily.     Multiple Vitamins-Minerals (MULTIVITAMIN WOMEN 50+ PO) Take 1 tablet by mouth daily.     nicotine (NICODERM CQ - DOSED IN MG/24 HOURS) 21 mg/24hr patch Place 21 mg onto the skin daily.     Nutritional Supplements (KATE FARMS STANDARD 1.4) LIQD 1 Bottle by Enteral route 4 (four) times daily. 1300 mL 6   omeprazole (FIRST-OMEPRAZOLE) 2 mg/mL SUSP oral suspension Take 10 mLs (20 mg total) by mouth daily. 300 mL 0   potassium chloride (KLOR-CON) 10 MEQ tablet Take 10 mEq by mouth daily.      sharps container Use as directed for sharps disposal     sodium fluoride (SODIUM FLUORIDE 5000 PPM) 1.1 % GEL dental gel Place 1 application onto teeth at bedtime. Place 1 pea-size drop into each tooth space of fluoride trays once a day at bedtime.  Leave trays in for 5 minutes and then remove.  Spit out excess fluoride, but DO NOT rinse with water, eat or drink for at least 30 minutes after use. 120 mL 11   SPIRIVA RESPIMAT 2.5 MCG/ACT AERS Inhale 2 puffs into the lungs daily.     vitamin B-12 (CYANOCOBALAMIN) 500 MCG tablet Take 500 mcg by mouth daily.     zolpidem (AMBIEN) 5 MG tablet Take 1 tablet (5 mg total) by mouth at bedtime as needed for sleep. 10 tablet 0   No current facility-administered medications for this visit.    PHYSICAL EXAMINATION:  ECOG PERFORMANCE STATUS: 1 - Symptomatic but completely ambulatory  Vitals:   03/14/21 1139  BP: 120/63  Pulse: (!) 107  Resp: 18  Temp: 97.9 F (36.6 C)  SpO2: 96%    Physical Exam Constitutional:      Appearance: Normal appearance.  HENT:     Head: Normocephalic and atraumatic.     Mouth/Throat:     Mouth: Mucous membranes are dry.     Pharynx: No oropharyngeal exudate or posterior oropharyngeal erythema.     Comments: Nicotine staining on the tongue Cardiovascular:     Rate and Rhythm: Normal rate and regular rhythm.     Pulses: Normal pulses.     Heart sounds: Normal heart sounds.  Pulmonary:     Effort: Pulmonary effort is normal.     Breath sounds: Normal breath sounds.  Abdominal:     General: Abdomen is flat. Bowel sounds are normal.     Palpations: Abdomen is soft.     Comments: Some skin irritation around G tube.  Musculoskeletal:        General: No swelling or tenderness.     Cervical back: Normal range of motion and neck supple.  Lymphadenopathy:     Cervical: No cervical adenopathy.  Skin:    General: Skin is warm and dry.     Findings: No erythema or rash.  Neurological:     General: No focal deficit  present.     Mental Status: She is alert.  Psychiatric:        Mood and Affect: Mood normal.   LABORATORY DATA:    Lab Results  Component Value Date   WBC 7.9 03/14/2021   HGB 12.5 03/14/2021   HCT 37.6 03/14/2021   MCV 103.6 (H) 03/14/2021   PLT 275 03/14/2021     Chemistry      Component Value Date/Time   NA 139 03/14/2021 1123   K 3.5 03/14/2021 1123   CL 103 03/14/2021 1123   CO2 28 03/14/2021 1123   BUN 12 03/14/2021 1123   CREATININE 1.18 (H) 03/14/2021 1123      Component Value Date/Time   CALCIUM 9.4 03/14/2021 1123   ALKPHOS 55 07/13/2020 1239   AST 14 (L) 07/13/2020 1239   ALT 8 07/13/2020 1239   BILITOT 0.3 07/13/2020 1239       In situ study for HPV 16/18 is negative. Slides will ne sent to [facility] for a broader HR HPV panel  Addendum electronically signed by Isac Caddy Askin, MD on 06/18/2020 at  5:04 PM  Addendum    The p16 immunostain is positive. HR HPV study ordered.  Addendum electronically signed by Isac Caddy Askin, MD on 06/18/2020 at  1:57 PM  Diagnosis    A: Throat, right, biopsy - Fragments of keratinizing well differentiated squamous carcinoma.  A single focus of submucosa with invasive carcinoma is present   Addendum 3    Noted 07/01/2000: Tissue was sent to [facility] for a broad HR HPV ISH study.  Results were: Negative.  The controls were appropriate     RADIOGRAPHIC STUDIES: I have personally reviewed the radiological images as listed and agreed with the findings in the report.   No results found.    Benay Pike, MD 03/14/2021 12:46 PM

## 2021-03-17 ENCOUNTER — Ambulatory Visit: Payer: Medicare Other | Attending: Radiation Oncology

## 2021-03-23 NOTE — Progress Notes (Signed)
Oncology Nurse Navigator Documentation   I left a message with Cathy Knight today informing her of her upcoming appointments. She is scheduled for her PAC and PEG removal on 03/29/21 arrival time 3 pm and she is also scheduled to see Dory Peru on 2/27 at 11:15 for weight check and nutrition evaluation. I have notified her of those appointments and provided my direct contact information if she has any questions.  Harlow Asa RN, BSN, OCN Head & Neck Oncology Nurse [place] at [facility] Phone # [phone]  Fax # [phone]

## 2021-03-24 ENCOUNTER — Telehealth: Payer: Self-pay | Admitting: Nutrition

## 2021-03-24 ENCOUNTER — Other Ambulatory Visit: Payer: Self-pay | Admitting: Hematology and Oncology

## 2021-03-24 ENCOUNTER — Encounter: Payer: Medicare Other | Admitting: Nutrition

## 2021-03-24 DIAGNOSIS — C109 Malignant neoplasm of oropharynx, unspecified: Secondary | ICD-10-CM

## 2021-03-24 NOTE — Telephone Encounter (Signed)
Contacted patient by telephone.  She was unavailable so left a message and asked for her to return my call.

## 2021-03-24 NOTE — Telephone Encounter (Signed)
Contacted patient by phone and she returned my call.  Patient distressed regarding continued weight loss.  Reports she weighed 107 pounds this morning on her home scale.  Complains of not sleeping the last 3 nights and having a headache recently.  She denies nausea and vomiting.  Reports she did have some loose stools early this morning.  She is scheduled for feeding tube removal on Tuesday, January 31.  Patient states her feeding tube has been bothering her but is nonspecific as to the reason.  States she has been eating 3 meals with snacks daily.  Reports "I am at least eating 1500-2000 cal a day." She does not like Ensure or boost or other nutrition supplements.  She has tried a nutrition drink [employer]) from [employer], which she liked but it was too thick.  Patient apologized for being grouchy.  States she is very tired.  She is frustrated by continued weight loss.  She would like labs to be ordered to determine if there is another reason for her weight loss.  Nutrition diagnosis: Inadequate oral intake continues.  Estimated nutrition needs: 2000-2300 cal, 75-100 g protein.  Intervention: Questioned patient about her decision to have her feeding tube removed.  Explained that this would be a way to provide additional calories while she is increasing oral intake.  Patient adamantly refuses to keep feeding tube. Suggested patient go to the grocery store and look for other drinks that would provide additional calories and protein.  Educated on strategies for adding juice or water to thin liquids down if they are too thick. Provided active listening. Recommend weekly weight checks. Asked patient to keep a food journal with quantity and types of food she is eating throughout the day.  Explained this would help to identify places where we could add additional calories to minimize further weight loss.  Patient is agreeable. Communicated with medical oncologist about patient's desire to have  labs drawn.  Monitoring, evaluation, goals: Patient will tolerate increased calories and protein to minimize further weight loss and promote weight gain.  Next visit: Will try to schedule follow-up with upcoming labs or on same day as feeding tube removal.  **Disclaimer: This note was dictated with voice recognition software. Similar sounding words can inadvertently be transcribed and this note may contain transcription errors which may not have been corrected upon publication of note.**

## 2021-03-28 ENCOUNTER — Ambulatory Visit
Admit: 2021-03-28 | Discharge: 2021-03-28 | Payer: MEDICARE | Attending: Student in an Organized Health Care Education/Training Program | Primary: Student in an Organized Health Care Education/Training Program

## 2021-03-28 ENCOUNTER — Telehealth: Payer: Self-pay | Admitting: Hematology and Oncology

## 2021-03-28 DIAGNOSIS — I2510 Atherosclerotic heart disease of native coronary artery without angina pectoris: Principal | ICD-10-CM

## 2021-03-28 DIAGNOSIS — I10 Essential (primary) hypertension: Principal | ICD-10-CM

## 2021-03-28 DIAGNOSIS — E785 Hyperlipidemia, unspecified: Principal | ICD-10-CM

## 2021-03-28 DIAGNOSIS — I519 Heart disease, unspecified: Principal | ICD-10-CM

## 2021-03-28 MED ORDER — REPATHA SURECLICK 140 MG/ML SUBCUTANEOUS PEN INJECTOR
SUBCUTANEOUS | 3 refills | 84 days | Status: CP
Start: 2021-03-28 — End: ?

## 2021-03-28 NOTE — Telephone Encounter (Signed)
Scheduled appointment per 01/30 secure chat per provider. Left message.

## 2021-03-29 ENCOUNTER — Inpatient Hospital Stay: Payer: Medicare Other

## 2021-03-29 ENCOUNTER — Inpatient Hospital Stay: Payer: Medicare Other | Admitting: Dietician

## 2021-03-29 ENCOUNTER — Other Ambulatory Visit: Payer: Self-pay

## 2021-03-29 ENCOUNTER — Ambulatory Visit (HOSPITAL_COMMUNITY)
Admission: RE | Admit: 2021-03-29 | Discharge: 2021-03-29 | Disposition: A | Discharge status: Home / Self Care | Payer: Medicare Other | Source: Ambulatory Visit | Attending: Hematology and Oncology | Admitting: Hematology and Oncology

## 2021-03-29 DIAGNOSIS — C109 Malignant neoplasm of oropharynx, unspecified: Secondary | ICD-10-CM

## 2021-03-29 DIAGNOSIS — Z431 Encounter for attention to gastrostomy: Secondary | ICD-10-CM | POA: Insufficient documentation

## 2021-03-29 DIAGNOSIS — Z452 Encounter for adjustment and management of vascular access device: Secondary | ICD-10-CM | POA: Insufficient documentation

## 2021-03-29 DIAGNOSIS — R634 Abnormal weight loss: Secondary | ICD-10-CM

## 2021-03-29 DIAGNOSIS — C090 Malignant neoplasm of tonsillar fossa: Secondary | ICD-10-CM | POA: Diagnosis not present

## 2021-03-29 HISTORY — PX: IR REMOVAL TUN ACCESS W/ PORT W/O FL MOD SED: IMG2290

## 2021-03-29 HISTORY — PX: IR GASTROSTOMY TUBE REMOVAL: IMG5492

## 2021-03-29 LAB — CBC WITH DIFFERENTIAL/PLATELET
Abs Immature Granulocytes: 0.02 10*3/uL (ref 0.00–0.07)
Basophils Absolute: 0 10*3/uL (ref 0.0–0.1)
Basophils Relative: 1 %
Eosinophils Absolute: 0 10*3/uL (ref 0.0–0.5)
Eosinophils Relative: 1 %
HCT: 39.1 % (ref 36.0–46.0)
Hemoglobin: 13 g/dL (ref 12.0–15.0)
Immature Granulocytes: 0 %
Lymphocytes Relative: 13 %
Lymphs Abs: 1 10*3/uL (ref 0.7–4.0)
MCH: 34.3 pg — ABNORMAL HIGH (ref 26.0–34.0)
MCHC: 33.2 g/dL (ref 30.0–36.0)
MCV: 103.2 fL — ABNORMAL HIGH (ref 80.0–100.0)
Monocytes Absolute: 0.5 10*3/uL (ref 0.1–1.0)
Monocytes Relative: 7 %
Neutro Abs: 5.9 10*3/uL (ref 1.7–7.7)
Neutrophils Relative %: 78 %
Platelets: 235 10*3/uL (ref 150–400)
RBC: 3.79 MIL/uL — ABNORMAL LOW (ref 3.87–5.11)
RDW: 15.1 % (ref 11.5–15.5)
WBC: 7.5 10*3/uL (ref 4.0–10.5)
nRBC: 0 % (ref 0.0–0.2)

## 2021-03-29 LAB — COMPREHENSIVE METABOLIC PANEL
ALT: 11 U/L (ref 0–44)
AST: 18 U/L (ref 15–41)
Albumin: 4.3 g/dL (ref 3.5–5.0)
Alkaline Phosphatase: 43 U/L (ref 38–126)
Anion gap: 10 (ref 5–15)
BUN: 13 mg/dL (ref 8–23)
CO2: 22 mmol/L (ref 22–32)
Calcium: 9.8 mg/dL (ref 8.9–10.3)
Chloride: 104 mmol/L (ref 98–111)
Creatinine, Ser: 0.98 mg/dL (ref 0.44–1.00)
GFR, Estimated: >60 mL/min (ref >60)
Glucose, Bld: 86 mg/dL (ref 70–99)
Potassium: 4.2 mmol/L (ref 3.5–5.1)
Sodium: 136 mmol/L (ref 135–145)
Total Bilirubin: 0.5 mg/dL (ref 0.3–1.2)
Total Protein: 6.9 g/dL (ref 6.5–8.1)

## 2021-03-29 LAB — TSH: TSH: 1.434 u[IU]/mL (ref 0.308–3.960)

## 2021-03-29 LAB — MAGNESIUM: Magnesium: 2 mg/dL (ref 1.7–2.4)

## 2021-03-29 MED ORDER — LIDOCAINE VISCOUS HCL 2 % MT SOLN
OROMUCOSAL | Status: AC
  Filled 2021-03-29: qty 15

## 2021-03-29 MED ORDER — LIDOCAINE-EPINEPHRINE 1 %-1:100000 IJ SOLN
INTRAMUSCULAR | Status: AC
  Filled 2021-03-29: qty 1

## 2021-03-29 NOTE — Progress Notes (Signed)
Patient did not show for nutrition follow-up and weight check.

## 2021-03-31 ENCOUNTER — Telehealth (HOSPITAL_COMMUNITY): Payer: Self-pay | Admitting: Dietician

## 2021-03-31 NOTE — Telephone Encounter (Signed)
Attempted to contact patient for nutrition follow-up. Patient did not answer. Left voicemail with request for return call. Contact information provided.

## 2021-04-01 ENCOUNTER — Ambulatory Visit
Admit: 2021-04-01 | Discharge: 2021-04-01 | Payer: MEDICARE | Attending: Student in an Organized Health Care Education/Training Program | Primary: Student in an Organized Health Care Education/Training Program

## 2021-04-01 DIAGNOSIS — I739 Peripheral vascular disease, unspecified: Principal | ICD-10-CM

## 2021-04-04 NOTE — Therapy (Signed)
[facility] [address], [place], [place], [address] Phone: [phone]   Fax:  [phone]  Patient Details  Name: Cathy Knight MRN: [id number] Date of Birth: 15-Jul-1950 Referring Provider:  Eppie Gibson, MD  Encounter Date: 04/04/2021  SPEECH THERAPY DISCHARGE SUMMARY  Visits from Start of Care: 3 in approx 8 months  Current functional level related to goals / functional outcomes: Pt was asked routinely to schedule follow up with ST and did not schedule regular follow up ST appointments. She was scheduled for an appointment on 03-17-21, was called and reminded of this appointment on 03-15-21, and no-showed to this appointment. SLP called pt on 04-04-21 and inquired if pt would like to reschedule her missed appointment on 03-17-21 or would like to be discharged and pt opted to be discharged. She stated she has never had any trouble with her swallowing. SLP reminded pt if she has not been consistent with HEP completion at x1-2/day, to complete HEP 1-2 times/day for 6 months, and then x2/week following that. Pt stated she would do HEP x1-2/day until August and then reduce to x2/week.  SLP also reminded pt to contact rad onc or ENT should she have dysphagia in the future. Pt acknowledged understanding.    Remaining deficits: Unknown - last seen 12-30-20.   Education / Equipment: HEP procedure, late effects head/neck radiation on swallowing function.  Patient agrees to discharge. Patient goals were partially met. Patient is being discharged due to the patient's request..      [facility], [place] 04/04/2021, 4:29 PM  [facility] [place] [address] [place], [place], [address] Phone: [phone]   Fax:  [phone]

## 2021-04-25 ENCOUNTER — Encounter: Payer: Self-pay | Admitting: Nutrition

## 2021-04-25 ENCOUNTER — Inpatient Hospital Stay: Payer: Medicare Other | Attending: Hematology and Oncology | Admitting: Nutrition

## 2021-04-25 NOTE — Progress Notes (Signed)
Patient did not show up for nutrition appointment to check weight.  Patient had her feeding tube removed.  No follow-up to be scheduled in the future.  Please refer back to RD if needs identified.

## 2021-05-26 ENCOUNTER — Ambulatory Visit: Payer: Medicare Other | Admitting: Hematology and Oncology

## 2021-05-26 ENCOUNTER — Other Ambulatory Visit: Payer: Medicare Other

## 2021-05-27 ENCOUNTER — Ambulatory Visit
Admit: 2021-05-27 | Discharge: 2021-05-27 | Payer: MEDICARE | Attending: Student in an Organized Health Care Education/Training Program | Primary: Student in an Organized Health Care Education/Training Program

## 2021-05-27 ENCOUNTER — Ambulatory Visit: Admit: 2021-05-27 | Discharge: 2021-05-27 | Payer: MEDICARE

## 2021-05-27 DIAGNOSIS — I739 Peripheral vascular disease, unspecified: Principal | ICD-10-CM

## 2021-06-09 ENCOUNTER — Encounter: Payer: Self-pay | Admitting: Hematology and Oncology

## 2021-06-24 ENCOUNTER — Ambulatory Visit: Admit: 2021-06-24 | Discharge: 2021-06-25 | Payer: MEDICARE

## 2021-06-24 ENCOUNTER — Ambulatory Visit
Admit: 2021-06-24 | Discharge: 2021-06-25 | Payer: MEDICARE | Attending: Student in an Organized Health Care Education/Training Program | Primary: Student in an Organized Health Care Education/Training Program

## 2021-06-24 DIAGNOSIS — I739 Peripheral vascular disease, unspecified: Principal | ICD-10-CM

## 2021-07-01 ENCOUNTER — Encounter: Payer: Self-pay | Admitting: Hematology and Oncology

## 2021-07-12 ENCOUNTER — Ambulatory Visit: Admit: 2021-07-12 | Discharge: 2021-07-13 | Payer: MEDICARE

## 2021-07-13 ENCOUNTER — Encounter
Admit: 2021-07-13 | Discharge: 2021-07-14 | Payer: MEDICARE | Attending: Student in an Organized Health Care Education/Training Program | Primary: Student in an Organized Health Care Education/Training Program

## 2021-07-13 ENCOUNTER — Ambulatory Visit: Admit: 2021-07-13 | Discharge: 2021-07-14 | Payer: MEDICARE

## 2021-07-13 MED ORDER — CLOPIDOGREL 75 MG TABLET
ORAL_TABLET | Freq: Every day | ORAL | 0 refills | 90 days | Status: CP
Start: 2021-07-13 — End: 2021-10-11

## 2021-07-14 DIAGNOSIS — I739 Peripheral vascular disease, unspecified: Principal | ICD-10-CM

## 2021-07-14 MED ORDER — OXYCODONE 5 MG TABLET
ORAL_TABLET | ORAL | 0 refills | 1 days | Status: CP | PRN
Start: 2021-07-14 — End: 2021-07-19
  Filled 2021-07-14: qty 5, 1d supply, fill #0

## 2021-07-27 ENCOUNTER — Ambulatory Visit: Admit: 2021-07-27 | Discharge: 2021-07-27 | Payer: MEDICARE

## 2021-07-27 DIAGNOSIS — J449 Chronic obstructive pulmonary disease, unspecified: Principal | ICD-10-CM

## 2021-07-27 MED ORDER — PREDNISONE 20 MG TABLET
ORAL_TABLET | Freq: Every day | ORAL | 0 refills | 5 days | Status: CP
Start: 2021-07-27 — End: ?

## 2021-08-01 ENCOUNTER — Ambulatory Visit
Admit: 2021-08-01 | Discharge: 2021-08-02 | Payer: MEDICARE | Attending: Student in an Organized Health Care Education/Training Program | Primary: Student in an Organized Health Care Education/Training Program

## 2021-08-01 DIAGNOSIS — I519 Heart disease, unspecified: Principal | ICD-10-CM

## 2021-08-01 DIAGNOSIS — I2510 Atherosclerotic heart disease of native coronary artery without angina pectoris: Principal | ICD-10-CM

## 2021-08-01 DIAGNOSIS — I10 Essential (primary) hypertension: Principal | ICD-10-CM

## 2021-08-01 DIAGNOSIS — E785 Hyperlipidemia, unspecified: Principal | ICD-10-CM

## 2021-08-05 ENCOUNTER — Ambulatory Visit: Admit: 2021-08-05 | Discharge: 2021-08-06 | Payer: MEDICARE

## 2021-08-05 ENCOUNTER — Ambulatory Visit
Admit: 2021-08-05 | Discharge: 2021-08-06 | Payer: MEDICARE | Attending: Student in an Organized Health Care Education/Training Program | Primary: Student in an Organized Health Care Education/Training Program

## 2021-08-05 DIAGNOSIS — I739 Peripheral vascular disease, unspecified: Principal | ICD-10-CM

## 2021-09-01 NOTE — Progress Notes (Signed)
Cathy Knight presents today for follow-up after completing radiation to her right tonsil 09/23/2020  Pain issues, if any: Denies any mouth or throat pain Using a feeding tube?: N/A--removed 03/29/2021 Weight changes, if any:  Wt Readings from Last 3 Encounters:  09/02/21 112 lb 6.4 oz (51 kg)  03/14/21 111 lb (50.3 kg)  02/09/21 116 lb 2 oz (52.7 kg)   Swallowing issues, if any: Continues to struggle with some solids, but reports since her saliva production has improved she can tolerate more types of food Smoking or chewing tobacco? Continues to smoke cigarettes  Using fluoride trays daily? N/A--reports dental concerns, bnut waiting until the first of the year to address  Last ENT visit was on: Having difficulty getting scheduled with Dr. Neysa Hotter [facility]) Other notable issues, if any: Denies any ear or jaw pain. Continues to deal with dry mouth and thick saliva, but reports it's slowly improving. Denies any signs/concerns for swelling under neck. Overall, reports she's doing well and feeling good

## 2021-09-02 ENCOUNTER — Ambulatory Visit
Admission: RE | Admit: 2021-09-02 | Discharge: 2021-09-02 | Disposition: A | Discharge status: Home / Self Care | Payer: Medicare Other | Source: Ambulatory Visit | Attending: Radiation Oncology | Admitting: Radiation Oncology

## 2021-09-02 ENCOUNTER — Encounter: Payer: Self-pay | Admitting: Radiation Oncology

## 2021-09-02 ENCOUNTER — Other Ambulatory Visit: Payer: Self-pay

## 2021-09-02 VITALS — BP 148/85 | HR 67 | Temp 97.5°F | Resp 20 | Wt 112.4 lb

## 2021-09-02 DIAGNOSIS — Z79899 Other long term (current) drug therapy: Secondary | ICD-10-CM | POA: Insufficient documentation

## 2021-09-02 DIAGNOSIS — F17210 Nicotine dependence, cigarettes, uncomplicated: Secondary | ICD-10-CM | POA: Insufficient documentation

## 2021-09-02 DIAGNOSIS — C109 Malignant neoplasm of oropharynx, unspecified: Secondary | ICD-10-CM

## 2021-09-02 DIAGNOSIS — C090 Malignant neoplasm of tonsillar fossa: Secondary | ICD-10-CM

## 2021-09-02 DIAGNOSIS — Z7902 Long term (current) use of antithrombotics/antiplatelets: Secondary | ICD-10-CM | POA: Diagnosis not present

## 2021-09-02 DIAGNOSIS — Z7982 Long term (current) use of aspirin: Secondary | ICD-10-CM | POA: Insufficient documentation

## 2021-09-02 DIAGNOSIS — Z85819 Personal history of malignant neoplasm of unspecified site of lip, oral cavity, and pharynx: Secondary | ICD-10-CM | POA: Insufficient documentation

## 2021-09-02 NOTE — Progress Notes (Unsigned)
Oncology Nurse Navigator Documentation   I met with Cathy Knight before and during her follow up appointment with Dr. Isidore Moos today. She is feeling well and maintaining her weight. She will see Dr. Chryl Heck on 7/24 and Dr. Isidore Moos as needed. I will contact her ENT, Dr. Charolett Bumpers and request an appointment in 3-4 months for evaluation.   Harlow Asa RN, BSN, OCN Head & Neck Oncology Nurse [place] at [facility] Phone # [phone]  Fax # [phone]

## 2021-09-02 NOTE — Progress Notes (Signed)
Radiation Oncology         [phone] ________________________________  Name: Cathy Knight MRN: [id number]  Date: 09/02/2021  DOB: [birth year]  Follow-Up Visit Note  CC: Alphia Moh, MD  Alphia Moh, MD  Diagnosis and Prior Radiotherapy:    C09.0   ICD-10-CM   1. 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       Cancer Staging  Cancer of tonsillar fossa ([place]) Staging form: Pharynx - P16 Negative Oropharynx, AJCC 8th Edition - Clinical stage from 07/20/2020: Stage IVA (cT2, cN2c, cM0, p16-) - Signed by Eppie Gibson, MD on 07/20/2020 Stage prefix: Initial diagnosis  Squamous cell carcinoma of oropharynx ([place]) Staging form: Pharynx - P16 Negative Oropharynx, AJCC 8th Edition - Clinical stage from 07/13/2020: Stage IVA (cT2, cN2c, cM0, p16-) - Signed by Benay Pike, MD on 07/14/2020 Stage prefix: Initial diagnosis    Radiation Treatment Dates: 08/04/2020 through 09/23/2020 Site Technique Total Dose (Gy) Dose per Fx (Gy) Completed Fx Beam Energies  Tonsil, Right: [id number]_[id number] IMRT 70/70 2 35/35 6X    CHIEF COMPLAINT:  Here for follow-up and surveillance of throat cancer  Narrative:    [name] presents today for follow-up after completing radiation to her right tonsil 09/23/2020  Pain issues, if any: Denies any mouth or throat pain Using a feeding tube?: N/A--removed 03/29/2021 Weight changes, if any:  Wt Readings from Last 3 Encounters:  09/02/21 112 lb 6.4 oz (51 kg)  03/14/21 111 lb (50.3 kg)  02/09/21 116 lb 2 oz (52.7 kg)   Swallowing issues, if any: Continues to struggle with some solids, but reports since her saliva production has improved she can tolerate more types of food Smoking or chewing tobacco? Continues to smoke cigarettes  Using fluoride trays daily? N/A--reports dental concerns, but waiting until the first of the year to address  Last ENT visit was on: Having difficulty getting scheduled  with Dr. Neysa Hotter [facility]) Other notable issues, if any: Denies any ear or jaw pain. Continues to deal with dry mouth and thick saliva, but reports it's slowly improving. Denies any signs/concerns for swelling under neck. Overall, reports she's doing well and feeling good   ALLERGIES:  is allergic to atorvastatin.  Meds: Current Outpatient Medications  Medication Sig Dispense Refill   albuterol (VENTOLIN HFA) 108 (90 Base) MCG/ACT inhaler Inhale 1-2 puffs into the lungs every 6 (six) hours as needed for wheezing or shortness of breath.     amLODipine (NORVASC) 5 MG tablet Take 5 mg by mouth daily.     aspirin 81 MG chewable tablet Chew 81 mg by mouth daily.     calcium citrate-vitamin D (CITRACAL+D) 315-200 MG-UNIT tablet Take 1 tablet by mouth 2 (two) times daily.     carvedilol (COREG) 6.25 MG tablet Take 6.25 mg by mouth 2 (two) times daily with a meal.     Cholecalciferol (VITAMIN D3) 125 MCG (5000 UT) CAPS Take 5,000 Units by mouth daily.     clopidogrel (PLAVIX) 75 MG tablet Take 75 mg by mouth daily.     co-enzyme Q-10 30 MG capsule Take 30 mg by mouth daily.     Evolocumab (REPATHA SURECLICK) 034 MG/ML SOAJ Inject 140 mg into the skin every 14 (fourteen) days.     Flaxseed, Linseed, (FLAXSEED OIL) 1000 MG CAPS Take 2,000 mg by mouth daily.     lisinopril (ZESTRIL) 20 MG tablet Take 0.5 tablets by  mouth daily.     Multiple Vitamins-Minerals (MULTIVITAMIN WOMEN 50+ PO) Take 1 tablet by mouth daily.     nicotine (NICODERM CQ - DOSED IN MG/24 HOURS) 21 mg/24hr patch Place 21 mg onto the skin daily.     omeprazole (FIRST-OMEPRAZOLE) 2 mg/mL SUSP oral suspension Take 10 mLs (20 mg total) by mouth daily. 300 mL 0   potassium chloride (KLOR-CON) 10 MEQ tablet Take 10 mEq by mouth daily.     sharps container Use as directed for sharps disposal     sodium fluoride (SODIUM FLUORIDE 5000 PPM) 1.1 % GEL dental gel Place 1 application onto teeth at bedtime. Place 1 pea-size drop into each  tooth space of fluoride trays once a day at bedtime.  Leave trays in for 5 minutes and then remove.  Spit out excess fluoride, but DO NOT rinse with water, eat or drink for at least 30 minutes after use. 120 mL 11   SPIRIVA RESPIMAT 2.5 MCG/ACT AERS Inhale 2 puffs into the lungs daily.     vitamin B-12 (CYANOCOBALAMIN) 500 MCG tablet Take 500 mcg by mouth daily.     No current facility-administered medications for this encounter.    Physical Findings: The patient is in no acute distress. Patient is alert and oriented. Wt Readings from Last 3 Encounters:  09/02/21 112 lb 6.4 oz (51 kg)  03/14/21 111 lb (50.3 kg)  02/09/21 116 lb 2 oz (52.7 kg)    weight is 112 lb 6.4 oz (51 kg). Her temperature is 97.5 F (36.4 C) (abnormal). Her blood pressure is 148/85 (abnormal) and her pulse is 67. Her respiration is 20 and oxygen saturation is 99%. .  General: Alert and oriented, in no acute distress HEENT: Head is normocephalic. Extraocular movements are intact. Oropharynx is notable for no visible tumor.  Poor dentition. Neck:   No palpable masses  Skin: Skin in treatment fields is intact Ext: Erythema in lower extremities indicative of suboptimal vascularity  Lymphatics: see Neck Exam Psychiatric: Judgment and insight are intact. Affect is appropriate. Heart regular rate and rhythm Chest clear to auscultation bilaterally   Lab Findings: Lab Results  Component Value Date   WBC 7.5 03/29/2021   HGB 13.0 03/29/2021   HCT 39.1 03/29/2021   MCV 103.2 (H) 03/29/2021   PLT 235 03/29/2021    Lab Results  Component Value Date   TSH 1.434 03/29/2021    Radiographic Findings: No results found.  Impression/Plan:    1) Head and Neck Cancer Status: No evidence of disease.  We talked about the potential role for surveillance imaging, particularly because she continues to smoke and does not intend to quit.  She states that she gets yearly imaging with pulmonology and encouraged her to continue  to do so.  Physical exam is very reassuring for lack of recurrence or progression in the head and neck region  2) Depressed mood: This is markedly better since her last appointment with me.  3) She continues to have low body weight but states that she has improved her appetite and is eating better.  She will continue to push high-calorie foods  4)) Risk Factors: The patient has been educated about risk factors including alcohol and tobacco abuse; they understand that avoidance of alcohol and tobacco is important to prevent recurrences as well as other cancers.  She continues to smoke and does not seem motivated to quit at this time  5) Swallowing: Patient has been seeing by swallowing therapist and has been  encouraged continuously to follow through with their advice, including swallowing exercises   6)  Dental: Encouraged to continue regular followup with dentistry -cost is an issue.  She intends to regroup with Dr. Benson Norway when her insurance renews  7) Thyroid function: Check annually with medical oncology Lab Results  Component Value Date   TSH 1.434 03/29/2021   8) She is doing much better than her previous appointment with me and has no evidence of disease.  I will see her back on an as-needed basis and her point person here at the cancer center will be Dr Chryl Heck.  Our navigator will refer her back to ENT as she was lost to follow-up with them   On date of service, in total, I spent 30 minutes on this encounter. Patient was seen in person. _____________________________________   Eppie Gibson, MD

## 2021-09-06 ENCOUNTER — Encounter: Payer: Self-pay | Admitting: Hematology and Oncology

## 2021-09-07 MED ORDER — TIOTROPIUM BROMIDE 2.5 MCG/ACTUATION MIST FOR INHALATION
Freq: Every day | RESPIRATORY_TRACT | 11 refills | 0 days | Status: CP
Start: 2021-09-07 — End: ?

## 2021-09-09 DIAGNOSIS — F17200 Nicotine dependence, unspecified, uncomplicated: Principal | ICD-10-CM

## 2021-09-09 DIAGNOSIS — F17210 Nicotine dependence, cigarettes, uncomplicated: Principal | ICD-10-CM

## 2021-09-19 ENCOUNTER — Inpatient Hospital Stay (HOSPITAL_BASED_OUTPATIENT_CLINIC_OR_DEPARTMENT_OTHER): Payer: Medicare Other | Admitting: Hematology and Oncology

## 2021-09-19 ENCOUNTER — Other Ambulatory Visit: Payer: Self-pay

## 2021-09-19 ENCOUNTER — Inpatient Hospital Stay: Payer: Medicare Other | Attending: Hematology and Oncology

## 2021-09-19 DIAGNOSIS — Z79899 Other long term (current) drug therapy: Secondary | ICD-10-CM | POA: Insufficient documentation

## 2021-09-19 DIAGNOSIS — C109 Malignant neoplasm of oropharynx, unspecified: Secondary | ICD-10-CM | POA: Diagnosis present

## 2021-09-19 DIAGNOSIS — Z923 Personal history of irradiation: Secondary | ICD-10-CM | POA: Diagnosis not present

## 2021-09-19 DIAGNOSIS — Z9221 Personal history of antineoplastic chemotherapy: Secondary | ICD-10-CM | POA: Diagnosis not present

## 2021-09-19 DIAGNOSIS — F17210 Nicotine dependence, cigarettes, uncomplicated: Secondary | ICD-10-CM | POA: Diagnosis not present

## 2021-09-19 LAB — CBC WITH DIFFERENTIAL/PLATELET
Abs Immature Granulocytes: 0.01 10*3/uL (ref 0.00–0.07)
Basophils Absolute: 0 10*3/uL (ref 0.0–0.1)
Basophils Relative: 1 %
Eosinophils Absolute: 0.1 10*3/uL (ref 0.0–0.5)
Eosinophils Relative: 2 %
HCT: 39.7 % (ref 36.0–46.0)
Hemoglobin: 13.6 g/dL (ref 12.0–15.0)
Immature Granulocytes: 0 %
Lymphocytes Relative: 15 %
Lymphs Abs: 1 10*3/uL (ref 0.7–4.0)
MCH: 35.1 pg — ABNORMAL HIGH (ref 26.0–34.0)
MCHC: 34.3 g/dL (ref 30.0–36.0)
MCV: 102.3 fL — ABNORMAL HIGH (ref 80.0–100.0)
Monocytes Absolute: 0.5 10*3/uL (ref 0.1–1.0)
Monocytes Relative: 7 %
Neutro Abs: 4.8 10*3/uL (ref 1.7–7.7)
Neutrophils Relative %: 75 %
Platelets: 216 10*3/uL (ref 150–400)
RBC: 3.88 MIL/uL (ref 3.87–5.11)
RDW: 13.2 % (ref 11.5–15.5)
WBC: 6.5 10*3/uL (ref 4.0–10.5)
nRBC: 0 % (ref 0.0–0.2)

## 2021-09-19 LAB — COMPREHENSIVE METABOLIC PANEL
ALT: 14 U/L (ref 0–44)
AST: 19 U/L (ref 15–41)
Albumin: 4.1 g/dL (ref 3.5–5.0)
Alkaline Phosphatase: 46 U/L (ref 38–126)
Anion gap: 5 (ref 5–15)
BUN: 25 mg/dL — ABNORMAL HIGH (ref 8–23)
CO2: 28 mmol/L (ref 22–32)
Calcium: 9.5 mg/dL (ref 8.9–10.3)
Chloride: 105 mmol/L (ref 98–111)
Creatinine, Ser: 1.12 mg/dL — ABNORMAL HIGH (ref 0.44–1.00)
GFR, Estimated: 53 mL/min — ABNORMAL LOW (ref >60)
Glucose, Bld: 91 mg/dL (ref 70–99)
Potassium: 4.3 mmol/L (ref 3.5–5.1)
Sodium: 138 mmol/L (ref 135–145)
Total Bilirubin: 0.4 mg/dL (ref 0.3–1.2)
Total Protein: 6.3 g/dL — ABNORMAL LOW (ref 6.5–8.1)

## 2021-09-19 LAB — TSH: TSH: 3.475 u[IU]/mL (ref 0.350–4.500)

## 2021-09-19 NOTE — Assessment & Plan Note (Addendum)
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 She received 6 out of 7 planned cycles of chemotherapy.  Last treatment on September 07, 2020.  She is here for follow-up after completion of chemoradiation. End of treatment PET in November with no evidence of disease. At this time there is no concern for recurrent disease on physical exam.   She has to establish with ENT again for surveillance.  She can return to clinic with medical oncology in 1 year.  Weight loss, her weight since January 2023 has remained stable.  Encouraged her to eat healthy. Encourage smoking cessation.  She is not ready.  She should continue annual lung cancer screening and mammograms and colonoscopy.  She said she is already scheduled for lung cancer screening.

## 2021-09-19 NOTE — Progress Notes (Signed)
[place] NOTE  Patient Care Team: Alphia Moh, MD as PCP - General (Family Medicine) Malmfelt, Stephani Police, RN as Oncology Nurse Navigator Tyler Pita, MD as Referring Physician (Otolaryngology) Eppie Gibson, MD as Consulting Physician (Radiation Oncology) Benay Pike, MD as Consulting Physician (Hematology and Oncology)  CHIEF COMPLAINTS/PURPOSE OF CONSULTATION:  Tonsillar cancer follow up after completion of chemoradiation.  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 She received 6 out of 7 planned cycles of chemotherapy.  Last treatment on September 07, 2020.  She is here for follow-up after completion of chemoradiation. End of treatment PET in November with no evidence of disease. At this time there is no concern for recurrent disease on physical exam.   She has to establish with ENT again for surveillance.  She can return to clinic with medical oncology in 1 year.  Weight loss, her weight since January 2023 has remained stable.  Encouraged her to eat healthy. Encourage smoking cessation.  She is not ready.  She should continue annual lung cancer screening and mammograms and colonoscopy.  She said she is already scheduled for lung cancer screening.  No orders of the defined types were placed in this encounter.     HISTORY OF PRESENTING ILLNESS:   Cathy Knight 71 y.o. female is here because of SCC oropharynx, P 16 +  Chronology  This is a very pleasant 71 year old female patient with newly diagnosed oropharyngeal cancer referred to medical oncology for recommendations.  During her last visit we discussed about concurrent chemoradiation given bilateral disease. She completed radiation on September 23, 2020. Cycle 6 of  chemotherapy completed on September 07, 2020.   Cycle 7 of chemotherapy omitted because of worsening performance status and poor tolerance. She completed radiation on 09/23/2020 PET scan results from 12/30/2020, no evidence of disease.  Interval History  She is here for follow-up.  She was recently seen by Dr. Isidore Moos on September 02, 2021 with no clinical evidence of disease.  She was lost to follow-up with ENT and will reestablish, working with the nurse navigator to establish at [facility]. She denies any sore throat, difficulty swallowing, has some tinnitus.  She also has some vascular surgeries.  She continues to smoke.  No change in breathing, bowel habits or urinary habits. Rest of the pertinent 10 point ROS reviewed and negative.  MEDICAL HISTORY:  Past Medical History:  Diagnosis Date   Cancer ([place])    Hypertension     SURGICAL HISTORY: Past Surgical History:  Procedure Laterality Date   IR CM INJ ANY COLONIC TUBE W/FLUORO  08/09/2020   IR GASTROSTOMY TUBE MOD SED  07/28/2020   IR GASTROSTOMY TUBE REMOVAL  03/29/2021   IR IMAGING GUIDED PORT INSERTION  07/28/2020   IR REMOVAL TUN ACCESS W/ PORT W/O FL MOD SED  03/29/2021    SOCIAL HISTORY: Social History   Socioeconomic History   Marital status: Single    Spouse name: Not on file   Number of children: Not on file   Years of education: Not on file   Highest education level: Not on file  Occupational History   Not on file  Tobacco Use   Smoking status: Every Day    Packs/day: 1.00    Years: 15.00    Total pack  years: 15.00    Types: Cigarettes   Smokeless tobacco: Never  Substance and Sexual Activity   Alcohol use: Not on file   Drug use: Not on file   Sexual activity: Not on file  Other Topics Concern   Not on file  Social History Narrative   Not on file   Social Determinants of Health   Financial Resource Strain: Low Risk  (08/04/2020)   Overall Financial Resource Strain (CARDIA)    Difficulty of Paying Living Expenses:  Not very hard  Food Insecurity: No Food Insecurity (08/04/2020)   Hunger Vital Sign    Worried About Running Out of Food in the Last Year: Never true    Ran Out of Food in the Last Year: Never true  Transportation Needs: No Transportation Needs (08/04/2020)   PRAPARE - Hydrologist (Medical): No    Lack of Transportation (Non-Medical): No  Physical Activity: Not on file  Stress: Not on file  Social Connections: Not on file  Intimate Partner Violence: Not At Risk (08/24/2020)   Humiliation, Afraid, Rape, and Kick questionnaire    Fear of Current or Ex-Partner: No    Emotionally Abused: No    Physically Abused: No    Sexually Abused: No    FAMILY HISTORY: No family history on file.  ALLERGIES:  is allergic to atorvastatin.  MEDICATIONS:  Current Outpatient Medications  Medication Sig Dispense Refill   albuterol (VENTOLIN HFA) 108 (90 Base) MCG/ACT inhaler Inhale 1-2 puffs into the lungs every 6 (six) hours as needed for wheezing or shortness of breath.     amLODipine (NORVASC) 5 MG tablet Take 5 mg by mouth daily.     aspirin 81 MG chewable tablet Chew 81 mg by mouth daily.     calcium citrate-vitamin D (CITRACAL+D) 315-200 MG-UNIT tablet Take 1 tablet by mouth 2 (two) times daily.     carvedilol (COREG) 6.25 MG tablet Take 6.25 mg by mouth 2 (two) times daily with a meal.     Cholecalciferol (VITAMIN D3) 125 MCG (5000 UT) CAPS Take 5,000 Units by mouth daily.     clopidogrel (PLAVIX) 75 MG tablet Take 75 mg by mouth daily.     co-enzyme Q-10 30 MG capsule Take 30 mg by mouth daily.     Evolocumab (REPATHA SURECLICK) 638 MG/ML SOAJ Inject 140 mg into the skin every 14 (fourteen) days.     Flaxseed, Linseed, (FLAXSEED OIL) 1000 MG CAPS Take 2,000 mg by mouth daily.     lisinopril (ZESTRIL) 20 MG tablet Take 0.5 tablets by mouth daily.     Multiple Vitamins-Minerals (MULTIVITAMIN WOMEN 50+ PO) Take 1 tablet by mouth daily.     nicotine (NICODERM CQ - DOSED  IN MG/24 HOURS) 21 mg/24hr patch Place 21 mg onto the skin daily.     omeprazole (FIRST-OMEPRAZOLE) 2 mg/mL SUSP oral suspension Take 10 mLs (20 mg total) by mouth daily. 300 mL 0   potassium chloride (KLOR-CON) 10 MEQ tablet Take 10 mEq by mouth daily.     sharps container Use as directed for sharps disposal     sodium fluoride (SODIUM FLUORIDE 5000 PPM) 1.1 % GEL dental gel Place 1 application onto teeth at bedtime. Place 1 pea-size drop into each tooth space of fluoride trays once a day at bedtime.  Leave trays in for 5 minutes and then remove.  Spit out excess fluoride, but DO NOT rinse with water, eat or drink for at least 30 minutes  after use. 120 mL 11   SPIRIVA RESPIMAT 2.5 MCG/ACT AERS Inhale 2 puffs into the lungs daily.     vitamin B-12 (CYANOCOBALAMIN) 500 MCG tablet Take 500 mcg by mouth daily.     No current facility-administered medications for this visit.    PHYSICAL EXAMINATION:  ECOG PERFORMANCE STATUS: 1 - Symptomatic but completely ambulatory  Vitals:   09/19/21 1220  BP: (!) 114/57  Resp: 17  Temp: 98.1 F (36.7 C)    Physical Exam Constitutional:      Appearance: Normal appearance.  HENT:     Head: Normocephalic and atraumatic.     Mouth/Throat:     Mouth: Mucous membranes are dry.     Pharynx: No oropharyngeal exudate or posterior oropharyngeal erythema.  Cardiovascular:     Rate and Rhythm: Normal rate and regular rhythm.     Pulses: Normal pulses.     Heart sounds: Normal heart sounds.  Pulmonary:     Effort: Pulmonary effort is normal.     Breath sounds: Normal breath sounds.  Abdominal:     General: Abdomen is flat. Bowel sounds are normal.     Palpations: Abdomen is soft.  Musculoskeletal:        General: No swelling or tenderness.     Cervical back: Normal range of motion and neck supple.  Lymphadenopathy:     Cervical: No cervical adenopathy.  Skin:    General: Skin is warm and dry.     Findings: No erythema or rash.  Neurological:      General: No focal deficit present.     Mental Status: She is alert.  Psychiatric:        Mood and Affect: Mood normal.    LABORATORY DATA:    Lab Results  Component Value Date   WBC 6.5 09/19/2021   HGB 13.6 09/19/2021   HCT 39.7 09/19/2021   MCV 102.3 (H) 09/19/2021   PLT 216 09/19/2021     Chemistry      Component Value Date/Time   NA 136 03/29/2021 1432   K 4.2 03/29/2021 1432   CL 104 03/29/2021 1432   CO2 22 03/29/2021 1432   BUN 13 03/29/2021 1432   CREATININE 0.98 03/29/2021 1432   CREATININE 1.18 (H) 03/14/2021 1123      Component Value Date/Time   CALCIUM 9.8 03/29/2021 1432   ALKPHOS 43 03/29/2021 1432   AST 18 03/29/2021 1432   AST 14 (L) 07/13/2020 1239   ALT 11 03/29/2021 1432   ALT 8 07/13/2020 1239   BILITOT 0.5 03/29/2021 1432   BILITOT 0.3 07/13/2020 1239       In situ study for HPV 16/18 is negative. Slides will ne sent to [facility] for a broader HR HPV panel  Addendum electronically signed by Isac Caddy Askin, MD on 06/18/2020 at  5:04 PM  Addendum    The p16 immunostain is positive. HR HPV study ordered.  Addendum electronically signed by Isac Caddy Askin, MD on 06/18/2020 at  1:57 PM  Diagnosis    A: Throat, right, biopsy - Fragments of keratinizing well differentiated squamous carcinoma.  A single focus of submucosa with invasive carcinoma is present   Addendum 3    Noted 07/01/2000: Tissue was sent to [facility] for a broad HR HPV ISH study.  Results were: Negative.  The controls were appropriate     RADIOGRAPHIC STUDIES: I have personally reviewed the radiological images as listed and agreed with the findings in the report. I spent  30 minutes in the care of this patient including history, physical exam, review of records, counseling and coordination of care  No results found.    Benay Pike, MD 09/19/2021 12:30 PM

## 2021-09-20 ENCOUNTER — Other Ambulatory Visit: Payer: Self-pay

## 2021-09-27 ENCOUNTER — Ambulatory Visit: Admit: 2021-09-27 | Discharge: 2021-09-28 | Payer: MEDICARE

## 2021-09-28 DIAGNOSIS — F17200 Nicotine dependence, unspecified, uncomplicated: Principal | ICD-10-CM

## 2021-09-28 DIAGNOSIS — F17210 Nicotine dependence, cigarettes, uncomplicated: Principal | ICD-10-CM

## 2021-10-10 MED ORDER — PREDNISONE 20 MG TABLET
ORAL_TABLET | Freq: Every day | ORAL | 0 refills | 5 days | Status: CP
Start: 2021-10-10 — End: ?

## 2021-10-17 NOTE — Progress Notes (Signed)
Oncology Nurse Navigator Documentation   At Dr. Isidore Moos and Ms. Polzin's request I have scheduled her with Dr. Charolett Bumpers ENT at [facility] for a follow up visit on 12/13 at 2:30 as she has been lost to ENT follow up at [facility]. I left Ms. Macaluso a voice mail with the appointment date and time and asked her to return my call so that I know she is aware of the appointment.   Harlow Asa RN, BSN, OCN Head & Neck Oncology Nurse [place] at [facility] Phone # [phone]  Fax # [phone]

## 2021-10-19 MED ORDER — PREDNISONE 20 MG TABLET
ORAL_TABLET | Freq: Every day | ORAL | 0 refills | 5 days
Start: 2021-10-19 — End: ?

## 2021-10-24 ENCOUNTER — Ambulatory Visit: Admit: 2021-10-24 | Discharge: 2021-10-25 | Payer: MEDICARE

## 2021-10-28 MED ORDER — PREDNISONE 20 MG TABLET
ORAL_TABLET | Freq: Every day | ORAL | 0 refills | 5 days | Status: CP
Start: 2021-10-28 — End: ?

## 2022-01-16 NOTE — Therapy (Signed)
  Note entered in error.   Garald Balding, MS, CCC-SLP

## 2022-02-07 ENCOUNTER — Ambulatory Visit
Admit: 2022-02-07 | Discharge: 2022-02-08 | Payer: MEDICARE | Attending: Student in an Organized Health Care Education/Training Program | Primary: Student in an Organized Health Care Education/Training Program

## 2022-02-07 DIAGNOSIS — I739 Peripheral vascular disease, unspecified: Principal | ICD-10-CM

## 2022-02-07 DIAGNOSIS — E785 Hyperlipidemia, unspecified: Principal | ICD-10-CM

## 2022-02-07 DIAGNOSIS — I2510 Atherosclerotic heart disease of native coronary artery without angina pectoris: Principal | ICD-10-CM

## 2022-02-07 DIAGNOSIS — I10 Essential (primary) hypertension: Principal | ICD-10-CM

## 2022-03-08 ENCOUNTER — Ambulatory Visit
Admit: 2022-03-08 | Discharge: 2022-03-08 | Payer: MEDICARE | Attending: Student in an Organized Health Care Education/Training Program | Primary: Student in an Organized Health Care Education/Training Program

## 2022-03-08 ENCOUNTER — Ambulatory Visit: Admit: 2022-03-08 | Discharge: 2022-03-08 | Payer: MEDICARE

## 2022-03-08 DIAGNOSIS — I739 Peripheral vascular disease, unspecified: Principal | ICD-10-CM

## 2022-03-22 ENCOUNTER — Ambulatory Visit: Admit: 2022-03-22 | Discharge: 2022-03-23 | Payer: MEDICARE

## 2022-03-22 DIAGNOSIS — J449 Chronic obstructive pulmonary disease, unspecified: Principal | ICD-10-CM

## 2022-03-22 DIAGNOSIS — R0902 Hypoxemia: Principal | ICD-10-CM

## 2022-03-22 DIAGNOSIS — F17200 Nicotine dependence, unspecified, uncomplicated: Principal | ICD-10-CM

## 2022-03-22 DIAGNOSIS — Z87891 Personal history of nicotine dependence: Principal | ICD-10-CM

## 2022-03-22 MED ORDER — BREZTRI AEROSPHERE 160 MCG-9MCG-4.8MCG/ACTUATION HFA AEROSOL INHALER
Freq: Two times a day (BID) | RESPIRATORY_TRACT | 3 refills | 0 days | Status: CP
Start: 2022-03-22 — End: ?

## 2022-03-22 MED ORDER — TRELEGY ELLIPTA 200 MCG-62.5 MCG-25 MCG POWDER FOR INHALATION
Freq: Every day | RESPIRATORY_TRACT | 3 refills | 0 days | Status: CP
Start: 2022-03-22 — End: ?

## 2022-03-22 MED ORDER — PREDNISONE 20 MG TABLET
ORAL_TABLET | Freq: Every day | ORAL | 2 refills | 5 days | Status: CP
Start: 2022-03-22 — End: ?

## 2022-04-22 DIAGNOSIS — I2510 Atherosclerotic heart disease of native coronary artery without angina pectoris: Principal | ICD-10-CM

## 2022-04-22 DIAGNOSIS — E785 Hyperlipidemia, unspecified: Principal | ICD-10-CM

## 2022-04-22 MED ORDER — REPATHA SURECLICK 140 MG/ML SUBCUTANEOUS PEN INJECTOR
0 refills | 0 days
Start: 2022-04-22 — End: ?

## 2022-04-25 MED ORDER — REPATHA SURECLICK 140 MG/ML SUBCUTANEOUS PEN INJECTOR
0 refills | 0 days | Status: CP
Start: 2022-04-25 — End: ?

## 2022-05-10 ENCOUNTER — Ambulatory Visit: Admit: 2022-05-10 | Discharge: 2022-05-10 | Payer: MEDICARE

## 2022-05-10 ENCOUNTER — Ambulatory Visit
Admit: 2022-05-10 | Discharge: 2022-05-10 | Payer: MEDICARE | Attending: Student in an Organized Health Care Education/Training Program | Primary: Student in an Organized Health Care Education/Training Program

## 2022-05-10 DIAGNOSIS — I739 Peripheral vascular disease, unspecified: Principal | ICD-10-CM

## 2022-06-12 ENCOUNTER — Ambulatory Visit: Admit: 2022-06-12 | Discharge: 2022-06-12 | Payer: MEDICARE

## 2022-06-12 MED ORDER — OXYCODONE 5 MG TABLET
ORAL_TABLET | ORAL | 0 refills | 2 days | Status: CP | PRN
Start: 2022-06-12 — End: 2022-06-17

## 2022-06-13 MED ORDER — CLOPIDOGREL 75 MG TABLET
ORAL_TABLET | Freq: Every day | ORAL | 0 refills | 30 days | Status: CP
Start: 2022-06-13 — End: 2022-07-13

## 2022-06-13 MED ORDER — TRELEGY ELLIPTA 200 MCG-62.5 MCG-25 MCG POWDER FOR INHALATION
Freq: Every day | RESPIRATORY_TRACT | 3 refills | 0 days | Status: CP
Start: 2022-06-13 — End: ?

## 2022-06-14 MED ORDER — TRELEGY ELLIPTA 200 MCG-62.5 MCG-25 MCG POWDER FOR INHALATION
Freq: Every day | RESPIRATORY_TRACT | 3 refills | 0 days | Status: CP
Start: 2022-06-14 — End: ?

## 2022-07-05 ENCOUNTER — Ambulatory Visit
Admit: 2022-07-05 | Discharge: 2022-07-06 | Payer: MEDICARE | Attending: Student in an Organized Health Care Education/Training Program | Primary: Student in an Organized Health Care Education/Training Program

## 2022-07-05 DIAGNOSIS — Z8589 Personal history of malignant neoplasm of other organs and systems: Principal | ICD-10-CM

## 2022-07-12 ENCOUNTER — Ambulatory Visit
Admit: 2022-07-12 | Discharge: 2022-07-12 | Payer: MEDICARE | Attending: Student in an Organized Health Care Education/Training Program | Primary: Student in an Organized Health Care Education/Training Program

## 2022-07-12 ENCOUNTER — Ambulatory Visit: Admit: 2022-07-12 | Discharge: 2022-07-12 | Payer: MEDICARE

## 2022-07-13 DIAGNOSIS — J449 Chronic obstructive pulmonary disease, unspecified: Principal | ICD-10-CM

## 2022-07-14 ENCOUNTER — Ambulatory Visit: Admit: 2022-07-14 | Discharge: 2022-07-15 | Payer: MEDICARE

## 2022-07-21 DIAGNOSIS — E785 Hyperlipidemia, unspecified: Principal | ICD-10-CM

## 2022-07-21 DIAGNOSIS — I2510 Atherosclerotic heart disease of native coronary artery without angina pectoris: Principal | ICD-10-CM

## 2022-07-21 MED ORDER — REPATHA SURECLICK 140 MG/ML SUBCUTANEOUS PEN INJECTOR
0 refills | 0 days
Start: 2022-07-21 — End: ?

## 2022-07-25 MED ORDER — REPATHA SURECLICK 140 MG/ML SUBCUTANEOUS PEN INJECTOR
0 refills | 0 days | Status: CP
Start: 2022-07-25 — End: ?

## 2022-07-26 ENCOUNTER — Ambulatory Visit: Admit: 2022-07-26 | Discharge: 2022-07-27 | Payer: MEDICARE

## 2022-08-21 ENCOUNTER — Encounter: Payer: Self-pay | Admitting: Hematology and Oncology

## 2022-08-22 ENCOUNTER — Ambulatory Visit
Admit: 2022-08-22 | Discharge: 2022-08-23 | Payer: MEDICARE | Attending: Student in an Organized Health Care Education/Training Program | Primary: Student in an Organized Health Care Education/Training Program

## 2022-08-22 DIAGNOSIS — E785 Hyperlipidemia, unspecified: Principal | ICD-10-CM

## 2022-08-22 DIAGNOSIS — I2510 Atherosclerotic heart disease of native coronary artery without angina pectoris: Principal | ICD-10-CM

## 2022-08-22 DIAGNOSIS — R0600 Dyspnea, unspecified: Principal | ICD-10-CM

## 2022-08-22 DIAGNOSIS — I739 Peripheral vascular disease, unspecified: Principal | ICD-10-CM

## 2022-08-22 DIAGNOSIS — E782 Mixed hyperlipidemia: Principal | ICD-10-CM

## 2022-08-22 DIAGNOSIS — I10 Essential (primary) hypertension: Principal | ICD-10-CM

## 2022-08-22 DIAGNOSIS — I519 Heart disease, unspecified: Principal | ICD-10-CM

## 2022-08-22 DIAGNOSIS — I70209 Unspecified atherosclerosis of native arteries of extremities, unspecified extremity: Principal | ICD-10-CM

## 2022-08-22 MED ORDER — LISINOPRIL 10 MG TABLET
ORAL_TABLET | Freq: Every day | ORAL | 3 refills | 90 days | Status: CP
Start: 2022-08-22 — End: ?

## 2022-08-22 MED ORDER — REPATHA SURECLICK 140 MG/ML SUBCUTANEOUS PEN INJECTOR
SUBCUTANEOUS | 1 refills | 84 days | Status: CP
Start: 2022-08-22 — End: ?

## 2022-08-22 MED ORDER — CARVEDILOL 6.25 MG TABLET
ORAL_TABLET | Freq: Two times a day (BID) | ORAL | 3 refills | 90 days | Status: CP
Start: 2022-08-22 — End: ?

## 2022-09-13 ENCOUNTER — Encounter: Payer: Self-pay | Admitting: Hematology and Oncology

## 2022-09-20 ENCOUNTER — Encounter: Payer: Self-pay | Admitting: Hematology and Oncology

## 2022-09-20 ENCOUNTER — Inpatient Hospital Stay: Payer: Medicare HMO | Attending: Hematology and Oncology | Admitting: Hematology and Oncology

## 2022-09-20 ENCOUNTER — Other Ambulatory Visit: Payer: Self-pay

## 2022-09-20 VITALS — BP 131/71 | HR 81 | Resp 18 | Ht 66.0 in | Wt 119.9 lb

## 2022-09-20 DIAGNOSIS — C109 Malignant neoplasm of oropharynx, unspecified: Secondary | ICD-10-CM

## 2022-09-20 DIAGNOSIS — F17210 Nicotine dependence, cigarettes, uncomplicated: Secondary | ICD-10-CM | POA: Insufficient documentation

## 2022-09-20 DIAGNOSIS — Z85818 Personal history of malignant neoplasm of other sites of lip, oral cavity, and pharynx: Secondary | ICD-10-CM | POA: Diagnosis not present

## 2022-09-20 DIAGNOSIS — Z7982 Long term (current) use of aspirin: Secondary | ICD-10-CM | POA: Diagnosis not present

## 2022-09-20 DIAGNOSIS — Z9221 Personal history of antineoplastic chemotherapy: Secondary | ICD-10-CM | POA: Insufficient documentation

## 2022-09-20 DIAGNOSIS — Z923 Personal history of irradiation: Secondary | ICD-10-CM | POA: Diagnosis not present

## 2022-09-20 DIAGNOSIS — Z79899 Other long term (current) drug therapy: Secondary | ICD-10-CM | POA: Diagnosis not present

## 2022-09-20 NOTE — Progress Notes (Signed)
[facility] CONSULT NOTE  Patient Care Team: Hughie Closs, MD as PCP - General (Family Medicine) Malmfelt, Lise Auer, RN as Oncology Nurse Navigator Sarajane Marek, MD as Referring Physician (Otolaryngology) Lonie Peak, MD as Consulting Physician (Radiation Oncology) Rachel Moulds, MD as Consulting Physician (Hematology and Oncology)  CHIEF COMPLAINTS/PURPOSE OF CONSULTATION:  Tonsillar cancer follow up after completion of chemoradiation.  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 She received 6 out of 7 planned cycles of chemotherapy.  Last treatment on September 07, 2020.  She is here for follow-up after completion of chemoradiation. End of treatment PET in November with no evidence of disease. At this time there is no concern for recurrent disease on physical exam.   She will continue follow-up with Dr. Fenton Malling for ENT surveillance at [facility].  Her most recent CT neck was also without any evidence of disease.  She also continues to follow-up with pulmonology for her lung cancer screening. Encouraged smoking cessation.  She was recommended to follow-up on her thyroid primary care physician.  She can return to clinic with [place] in 1 year.  No orders of the defined types were placed in this encounter.     HISTORY OF PRESENTING ILLNESS:   Cathy Knight 72 y.o. female is here because of SCC oropharynx, P 16 +  Chronology  This is a very pleasant 72 year old female patient with newly diagnosed oropharyngeal cancer referred to medical oncology for recommendations.  During her last visit we discussed about concurrent chemoradiation given bilateral disease. She completed radiation on September 23, 2020. Cycle 6 of chemotherapy completed on September 07, 2020.   Cycle 7 of chemotherapy omitted because of worsening performance status and poor tolerance. She completed radiation on 09/23/2020 PET scan results from 12/30/2020, no evidence of disease.  Interval History  She is here for follow-up.  Since her last visit here she was seen by Dr. Fenton Malling at [facility] there appears to be no evidence of recurrent disease on this most recent exam.  She also has CT chest every 6 months through her pulmonologist.  Dr. Fenton Malling recommended a CT neck with contrast annually.  She had a couple vascular procedures in both lower extremities.  She continues to smoke. Rest of the pertinent 10 point ROS reviewed and negative.  MEDICAL HISTORY:  Past Medical History:  Diagnosis Date   Cancer (HCC)    Hypertension     SURGICAL HISTORY: Past Surgical History:  Procedure Laterality Date   IR CM INJ ANY COLONIC TUBE W/FLUORO  08/09/2020   IR GASTROSTOMY TUBE MOD SED  07/28/2020   IR GASTROSTOMY TUBE REMOVAL  03/29/2021   IR IMAGING GUIDED PORT INSERTION  07/28/2020   IR REMOVAL TUN ACCESS W/ PORT W/O FL MOD SED  03/29/2021    SOCIAL HISTORY: Social History   Socioeconomic History   Marital status: Single    Spouse name: Not on file   Number of children: Not on file   Years of education: Not on file   Highest education level: Not on file  Occupational History   Not on file  Tobacco Use   Smoking status: Every Day    Current packs/day: 1.00    Average packs/day: 1 pack/day for 15.0 years (15.0 ttl pk-yrs)    Types: Cigarettes  Smokeless tobacco: Never  Substance and Sexual Activity   Alcohol use: Not on file   Drug use: Not on file   Sexual activity: Not on file  Other Topics Concern   Not on file  Social History Narrative   Not on file   Social Determinants of Health   Financial Resource Strain: Low Risk  (08/04/2020)   Overall Financial Resource Strain (CARDIA)    Difficulty of Paying Living Expenses: Not very hard  Food Insecurity: No Food Insecurity  (08/04/2020)   Hunger Vital Sign    Worried About Running Out of Food in the Last Year: Never true    Ran Out of Food in the Last Year: Never true  Transportation Needs: No Transportation Needs (08/04/2020)   PRAPARE - Administrator, Civil Service (Medical): No    Lack of Transportation (Non-Medical): No  Physical Activity: Not on file  Stress: Not on file  Social Connections: Not on file  Intimate Partner Violence: Not At Risk (08/24/2020)   Humiliation, Afraid, Rape, and Kick questionnaire    Fear of Current or Ex-Partner: No    Emotionally Abused: No    Physically Abused: No    Sexually Abused: No    FAMILY HISTORY: No family history on file.  ALLERGIES:  is allergic to atorvastatin.  MEDICATIONS:  Current Outpatient Medications  Medication Sig Dispense Refill   albuterol (VENTOLIN HFA) 108 (90 Base) MCG/ACT inhaler Inhale 1-2 puffs into the lungs every 6 (six) hours as needed for wheezing or shortness of breath.     amLODipine (NORVASC) 5 MG tablet Take 5 mg by mouth daily.     aspirin 81 MG chewable tablet Chew 81 mg by mouth daily.     calcium citrate-vitamin D (CITRACAL+D) 315-200 MG-UNIT tablet Take 1 tablet by mouth 2 (two) times daily.     carvedilol (COREG) 6.25 MG tablet Take 6.25 mg by mouth 2 (two) times daily with a meal.     Cholecalciferol (VITAMIN D3) 125 MCG (5000 UT) CAPS Take 5,000 Units by mouth daily.     clopidogrel (PLAVIX) 75 MG tablet Take 75 mg by mouth daily.     co-enzyme Q-10 30 MG capsule Take 30 mg by mouth daily.     Evolocumab (REPATHA SURECLICK) 140 MG/ML SOAJ Inject 140 mg into the skin every 14 (fourteen) days.     Flaxseed, Linseed, (FLAXSEED OIL) 1000 MG CAPS Take 2,000 mg by mouth daily.     lisinopril (ZESTRIL) 20 MG tablet Take 0.5 tablets by mouth daily.     Multiple Vitamins-Minerals (MULTIVITAMIN WOMEN 50+ PO) Take 1 tablet by mouth daily.     nicotine (NICODERM CQ - DOSED IN MG/24 HOURS) 21 mg/24hr patch Place 21 mg onto the  skin daily.     omeprazole (FIRST-OMEPRAZOLE) 2 mg/mL SUSP oral suspension Take 10 mLs (20 mg total) by mouth daily. 300 mL 0   potassium chloride (KLOR-CON) 10 MEQ tablet Take 10 mEq by mouth daily.     sharps container Use as directed for sharps disposal     sodium fluoride (SODIUM FLUORIDE 5000 PPM) 1.1 % GEL dental gel Place 1 application onto teeth at bedtime. Place 1 pea-size drop into each tooth space of fluoride trays once a day at bedtime.  Leave trays in for 5 minutes and then remove.  Spit out excess fluoride, but DO NOT rinse with water, eat or drink for at least 30 minutes after use. 120 mL 11   SPIRIVA RESPIMAT  2.5 MCG/ACT AERS Inhale 2 puffs into the lungs daily.     vitamin B-12 (CYANOCOBALAMIN) 500 MCG tablet Take 500 mcg by mouth daily.     No current facility-administered medications for this visit.    PHYSICAL EXAMINATION:  ECOG PERFORMANCE STATUS: 1 - Symptomatic but completely ambulatory  Vitals:   09/20/22 1153  BP: 131/71  Pulse: 81  Resp: 18  SpO2: 99%    Physical Exam Constitutional:      Appearance: Normal appearance.  HENT:     Head: Normocephalic and atraumatic.     Mouth/Throat:     Pharynx: No oropharyngeal exudate or posterior oropharyngeal erythema.     Comments: No tonsillar mass on oral exam Cardiovascular:     Rate and Rhythm: Normal rate and regular rhythm.     Pulses: Normal pulses.     Heart sounds: Normal heart sounds.  Pulmonary:     Effort: Pulmonary effort is normal.     Breath sounds: Normal breath sounds.  Abdominal:     General: Abdomen is flat. Bowel sounds are normal.     Palpations: Abdomen is soft.  Musculoskeletal:        General: No swelling or tenderness.     Cervical back: Normal range of motion and neck supple.  Lymphadenopathy:     Cervical: No cervical adenopathy.  Skin:    General: Skin is warm and dry.     Comments: BLE extremities with some signs of poor circulation  Neurological:     General: No focal  deficit present.     Mental Status: She is alert.  Psychiatric:        Mood and Affect: Mood normal.    LABORATORY DATA:    Lab Results  Component Value Date   WBC 6.5 09/19/2021   HGB 13.6 09/19/2021   HCT 39.7 09/19/2021   MCV 102.3 (H) 09/19/2021   PLT 216 09/19/2021     Chemistry      Component Value Date/Time   NA 138 09/19/2021 1148   K 4.3 09/19/2021 1148   CL 105 09/19/2021 1148   CO2 28 09/19/2021 1148   BUN 25 (H) 09/19/2021 1148   CREATININE 1.12 (H) 09/19/2021 1148   CREATININE 1.18 (H) 03/14/2021 1123      Component Value Date/Time   CALCIUM 9.5 09/19/2021 1148   ALKPHOS 46 09/19/2021 1148   AST 19 09/19/2021 1148   AST 14 (L) 07/13/2020 1239   ALT 14 09/19/2021 1148   ALT 8 07/13/2020 1239   BILITOT 0.4 09/19/2021 1148   BILITOT 0.3 07/13/2020 1239       In situ study for HPV 16/18 is negative. Slides will ne sent to [facility] for a broader HR HPV panel  Addendum electronically signed by Elyn Peers Askin, MD on 06/18/2020 at  5:04 PM  Addendum    The p16 immunostain is positive. HR HPV study ordered.  Addendum electronically signed by Elyn Peers Askin, MD on 06/18/2020 at  1:57 PM  Diagnosis    A: Throat, right, biopsy - Fragments of keratinizing well differentiated squamous carcinoma.  A single focus of submucosa with invasive carcinoma is present   Addendum 3    Noted 07/01/2000: Tissue was sent to [facility] for a broad HR HPV ISH study.  Results were: Negative.  The controls were appropriate     RADIOGRAPHIC STUDIES: I have personally reviewed the radiological images as listed and agreed with the findings in the report. I spent 30 minutes in  the care of this patient including history, physical exam, review of records, counseling and coordination of care  No results found.    Rachel Moulds, MD 09/20/2022 12:36 PM

## 2022-09-20 NOTE — Assessment & Plan Note (Signed)
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 She received 6 out of 7 planned cycles of chemotherapy.  Last treatment on September 07, 2020.  She is here for follow-up after completion of chemoradiation. End of treatment PET in November with no evidence of disease. At this time there is no concern for recurrent disease on physical exam.   She will continue follow-up with Dr. Fenton Malling for ENT surveillance at [facility].  Her most recent CT neck was also without any evidence of disease.  She also continues to follow-up with pulmonology for her lung cancer screening. Encouraged smoking cessation.  She was recommended to follow-up on her thyroid primary care physician.  She can return to clinic with [place] in 1 year.

## 2022-09-21 ENCOUNTER — Other Ambulatory Visit: Payer: Self-pay

## 2022-10-11 ENCOUNTER — Ambulatory Visit: Admit: 2022-10-11 | Discharge: 2022-10-11 | Payer: MEDICARE

## 2022-10-11 ENCOUNTER — Ambulatory Visit
Admit: 2022-10-11 | Discharge: 2022-10-11 | Payer: MEDICARE | Attending: Student in an Organized Health Care Education/Training Program | Primary: Student in an Organized Health Care Education/Training Program

## 2022-10-11 DIAGNOSIS — I739 Peripheral vascular disease, unspecified: Principal | ICD-10-CM

## 2022-10-11 DIAGNOSIS — J449 Chronic obstructive pulmonary disease, unspecified: Principal | ICD-10-CM

## 2022-10-11 DIAGNOSIS — C109 Malignant neoplasm of oropharynx, unspecified: Principal | ICD-10-CM

## 2022-10-11 DIAGNOSIS — Z8589 Personal history of malignant neoplasm of other organs and systems: Principal | ICD-10-CM

## 2022-10-24 ENCOUNTER — Ambulatory Visit: Admit: 2022-10-24 | Discharge: 2022-10-25 | Payer: MEDICARE

## 2022-12-17 ENCOUNTER — Emergency Department: Admit: 2022-12-17 | Discharge: 2022-12-17 | Disposition: A | Discharge status: Home / Self Care | Payer: MEDICARE

## 2022-12-17 ENCOUNTER — Ambulatory Visit: Admit: 2022-12-17 | Discharge: 2022-12-17 | Disposition: A | Discharge status: Home / Self Care | Payer: MEDICARE

## 2022-12-17 DIAGNOSIS — S82874A Nondisplaced pilon fracture of right tibia, initial encounter for closed fracture: Principal | ICD-10-CM

## 2022-12-17 DIAGNOSIS — S8991XA Unspecified injury of right lower leg, initial encounter: Principal | ICD-10-CM

## 2022-12-17 DIAGNOSIS — S82201A Unspecified fracture of shaft of right tibia, initial encounter for closed fracture: Principal | ICD-10-CM

## 2022-12-17 DIAGNOSIS — S82401A Unspecified fracture of shaft of right fibula, initial encounter for closed fracture: Principal | ICD-10-CM

## 2022-12-17 MED ORDER — OXYCODONE 5 MG TABLET
ORAL_TABLET | Freq: Four times a day (QID) | ORAL | 0 refills | 2 days | Status: CP | PRN
Start: 2022-12-17 — End: 2022-12-22

## 2022-12-21 MED ORDER — OXYCODONE 5 MG TABLET
ORAL_TABLET | Freq: Four times a day (QID) | ORAL | 0 refills | 2 days | PRN
Start: 2022-12-21 — End: 2022-12-26

## 2022-12-26 ENCOUNTER — Ambulatory Visit: Admit: 2022-12-26 | Discharge: 2022-12-27 | Payer: MEDICARE

## 2022-12-26 DIAGNOSIS — S8991XA Unspecified injury of right lower leg, initial encounter: Principal | ICD-10-CM

## 2022-12-26 DIAGNOSIS — S82201A Unspecified fracture of shaft of right tibia, initial encounter for closed fracture: Principal | ICD-10-CM

## 2022-12-26 DIAGNOSIS — S82401A Unspecified fracture of shaft of right fibula, initial encounter for closed fracture: Principal | ICD-10-CM

## 2022-12-26 DIAGNOSIS — S82874A Nondisplaced pilon fracture of right tibia, initial encounter for closed fracture: Principal | ICD-10-CM

## 2022-12-27 MED ORDER — HYDROCODONE 10 MG-ACETAMINOPHEN 325 MG TABLET
ORAL_TABLET | Freq: Four times a day (QID) | ORAL | 0 refills | 5 days | Status: CP | PRN
Start: 2022-12-27 — End: ?

## 2023-01-04 MED ORDER — HYDROCODONE 10 MG-ACETAMINOPHEN 325 MG TABLET
ORAL_TABLET | Freq: Four times a day (QID) | ORAL | 0 refills | 5 days | Status: CP | PRN
Start: 2023-01-04 — End: ?

## 2023-01-16 ENCOUNTER — Ambulatory Visit: Admit: 2023-01-16 | Discharge: 2023-01-17 | Payer: MEDICARE

## 2023-01-16 DIAGNOSIS — M79661 Pain in right lower leg: Principal | ICD-10-CM

## 2023-01-16 DIAGNOSIS — S82391D Other fracture of lower end of right tibia, subsequent encounter for closed fracture with routine healing: Principal | ICD-10-CM

## 2023-01-16 MED ORDER — HYDROCODONE 10 MG-ACETAMINOPHEN 325 MG TABLET
ORAL_TABLET | Freq: Four times a day (QID) | ORAL | 0 refills | 5 days | Status: CP | PRN
Start: 2023-01-16 — End: ?

## 2023-01-17 ENCOUNTER — Ambulatory Visit: Admit: 2023-01-17 | Discharge: 2023-01-18 | Payer: MEDICARE

## 2023-01-17 ENCOUNTER — Encounter: Payer: Self-pay | Admitting: Hematology and Oncology

## 2023-01-17 NOTE — Telephone Encounter (Signed)
Telephone call  

## 2023-02-05 ENCOUNTER — Ambulatory Visit: Admit: 2023-02-05 | Discharge: 2023-02-06 | Payer: MEDICARE

## 2023-02-05 DIAGNOSIS — S82391D Other fracture of lower end of right tibia, subsequent encounter for closed fracture with routine healing: Principal | ICD-10-CM

## 2023-02-15 IMAGING — XA IR PERC PLACEMENT GASTROSTOMY
3 series · 3 of 3 positions shown · non-contrast
Comparison: none

INDICATION: 69-year-old female with oro pharyngeal cancer. She presents for
percutaneous gastrostomy tube in preparation for possible radiation
esophagitis and dysphagia during treatment.

[Series 1: fl (-) angio · 1 of 1 slices shown (1 of 3)]
[im 1/1]
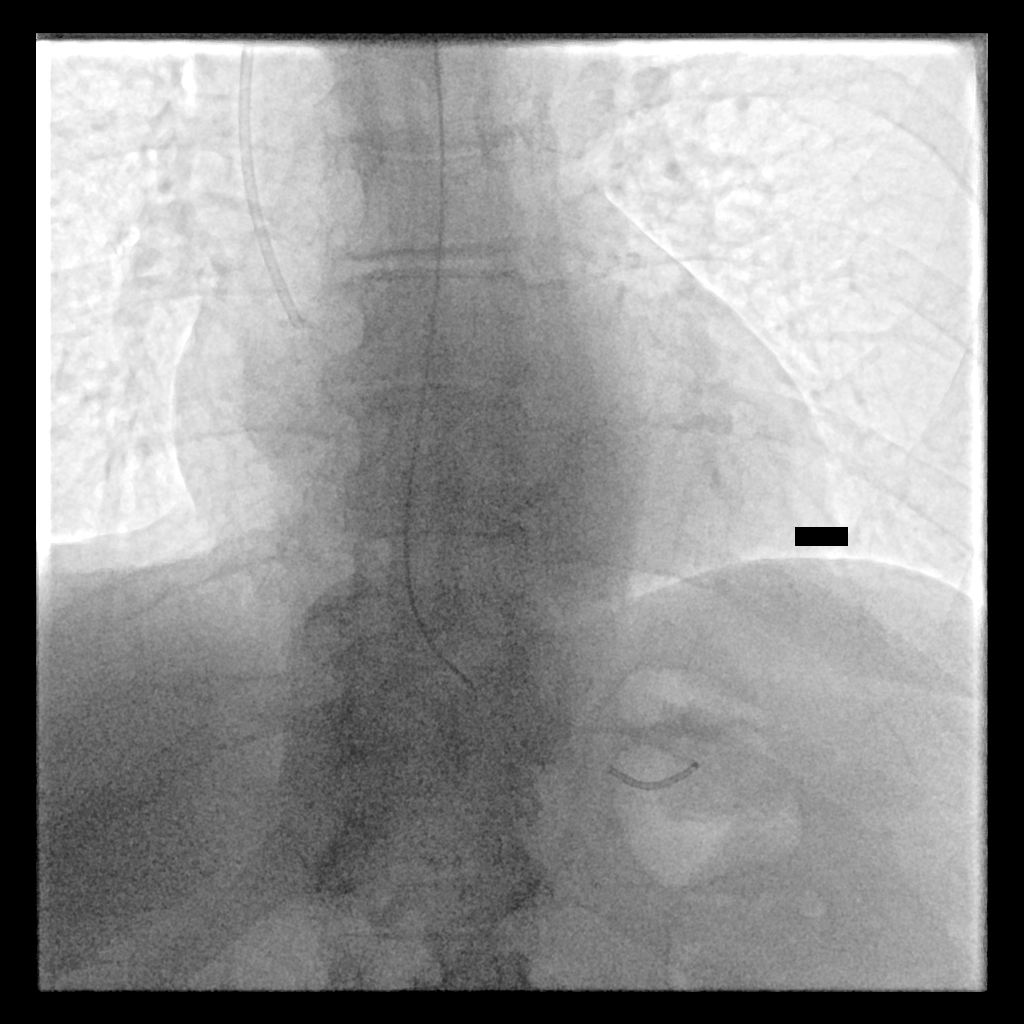

[Series 2: fl (-) angio · 1 of 1 slices shown (2 of 3)]
[im 1/1]
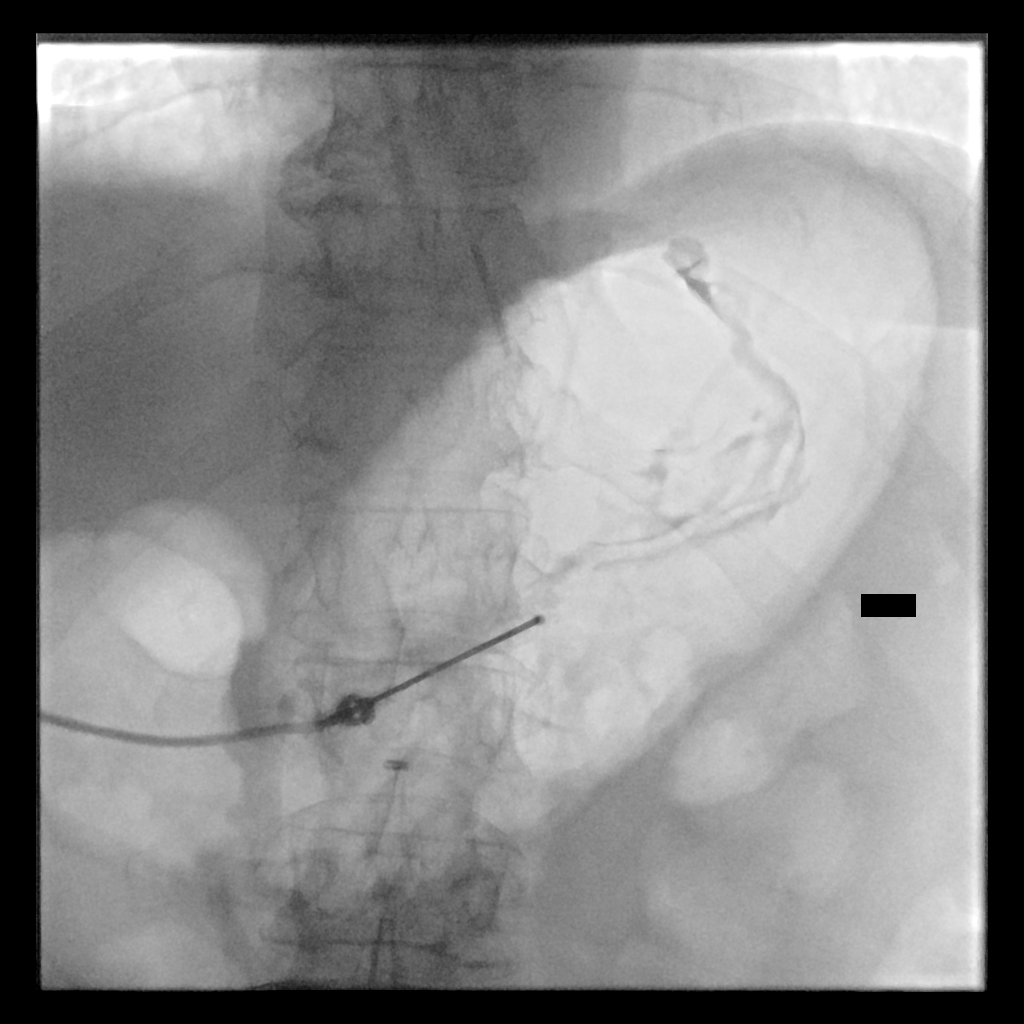

[Series 4: fl (-) angio · 1 of 1 slices shown (3 of 3)]
[im 1/1]
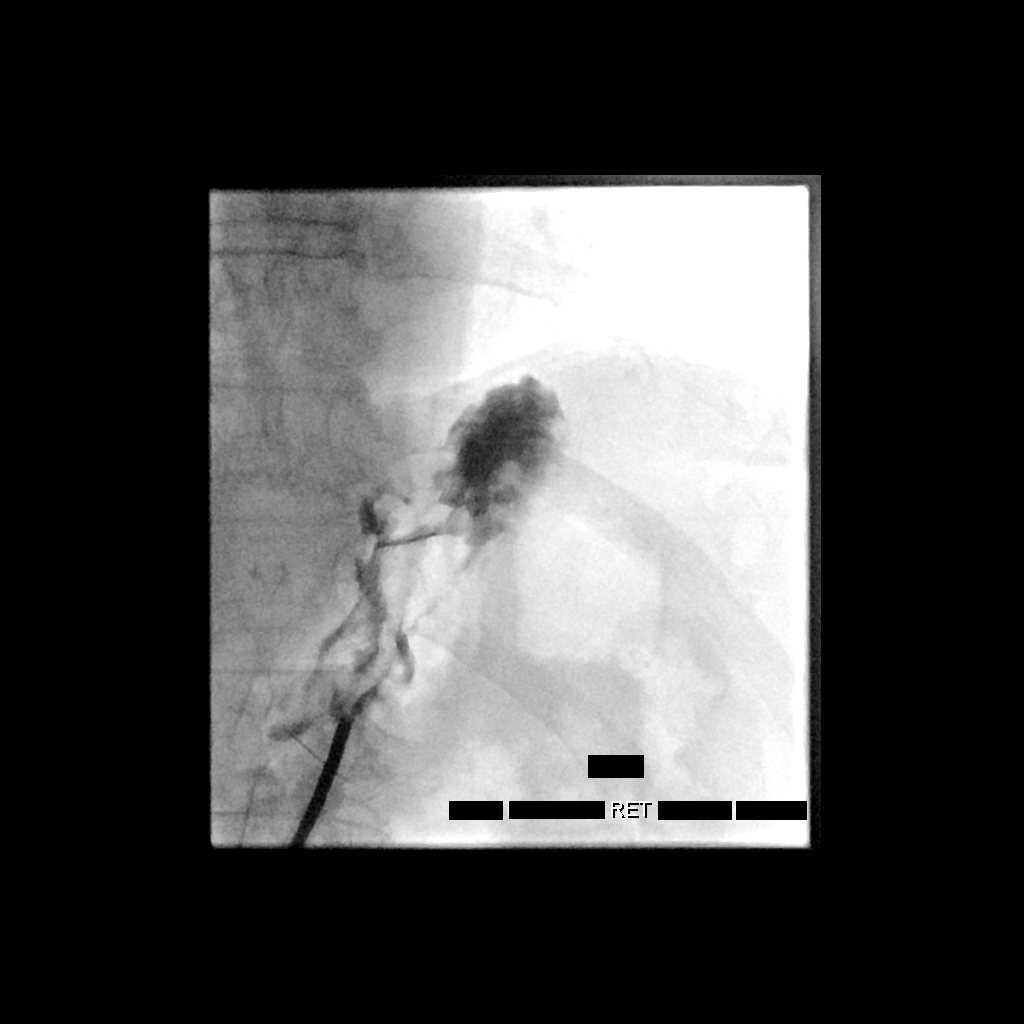

[3 of 3 positions shown; findings below may reference images not displayed]

EXAM:
Fluoroscopically guided placement of percutaneous balloon retention
gastrostomy tube

MEDICATIONS:
None.

ANESTHESIA/SEDATION:
Versed 2 mg IV; Fentanyl 50 mcg IV

Moderate Sedation Time:  22 minutes

The patient was continuously monitored during the procedure by the
interventional radiology nurse under my direct supervision.

CONTRAST:  20mL OMNIPAQUE IOHEXOL 300 MG/ML  SOLN

FLUOROSCOPY TIME:  Fluoroscopy Time: 4 minutes 0 seconds (22 mGy).

COMPLICATIONS:
None immediate.

PROCEDURE:
Informed written consent was obtained from the patient after a
thorough discussion of the procedural risks, benefits and
alternatives. All questions were addressed. Maximal Sterile Barrier
Technique was utilized including caps, mask, sterile gowns, sterile
gloves, sterile drape, hand hygiene and skin antiseptic. A timeout
was performed prior to the initiation of the procedure.

Maximal barrier sterile technique utilized including caps, mask,
sterile gowns, sterile gloves, large sterile drape, hand hygiene,
and chlorhexadine skin prep.

An angled catheter was advanced over a wire under fluoroscopic
guidance through the nose, down the esophagus and into the body of
the stomach. The stomach was then insufflated with several 100 ml of
air. Fluoroscopy confirmed location of the gastric bubble, as well
as inferior displacement of the barium stained colon. Under direct
fluoroscopic guidance, a single T-tack was placed, and the anterior
gastric wall drawn up against the anterior abdominal wall.
Percutaneous access was then obtained into the mid gastric body with
an 18 gauge sheath needle. Aspiration of air, and injection of
contrast material under fluoroscopy confirmed needle placement.

An Amplatz wire was advanced in the gastric body and the access
needle exchanged for a 9-French vascular sheath. A snare device was
advanced through the vascular sheath and an Amplatz wire advanced
through the angled catheter. There was a difficulty in snaring the
wire from within the stomach. Given difficulty maintaining
insufflation of the stomach, the decision was made to proceed with
placement of a balloon retention gastrostomy tube with the
assistance of balloon expansion. Therefore, the 9 French sheath was
removed and a 10 x 4 Athletis balloon was advanced over the wire and
positioned across the tract from the abdominal wall surface into the
stomach. A 20 French Entuit gastrostomy tube was also positioned on
the balloon. The balloon was inflated and then the balloon and
gastrostomy tube were pushed as a unit into the stomach. The
retention balloon was filled with 18 mL saline and pulled back snug
against the anterior abdominal wall. The external bumper was fixed
in place. Contrast injection through the tube confirms tube
placement within the stomach. The tube was then flushed with saline.
Hand injection of contrast material confirmed intragastric location.
The T-tack retention suture was then cut.

The patient will be observed for several hours with the newly placed
tube on low wall suction to evaluate for any post procedure
complication. The patient tolerated the procedure well, there is no
immediate complication.
IMPRESSION: Successful placement of a 20 French balloon retention gastrostomy
tube.

## 2023-02-16 ENCOUNTER — Ambulatory Visit
Admit: 2023-02-16 | Discharge: 2023-02-17 | Payer: MEDICARE | Attending: Student in an Organized Health Care Education/Training Program | Primary: Student in an Organized Health Care Education/Training Program

## 2023-02-16 DIAGNOSIS — Z8589 Personal history of malignant neoplasm of other organs and systems: Principal | ICD-10-CM

## 2023-02-16 DIAGNOSIS — C109 Malignant neoplasm of oropharynx, unspecified: Principal | ICD-10-CM

## 2023-02-27 IMAGING — XA IR GI TUBE CONTRAST INJECTION
4 series · 13 of 13 positions shown · non-contrast
Comparison: none

INDICATION: 69-year-old woman with history of oro pharyngeal malignancy status
post 20 French G-tube placement on 07/28/2020 returns with continued
pain at insertion site.

[Series 1: fl - angio · 4 of 56 frames shown (1 of 2)]
[frame 4/56]
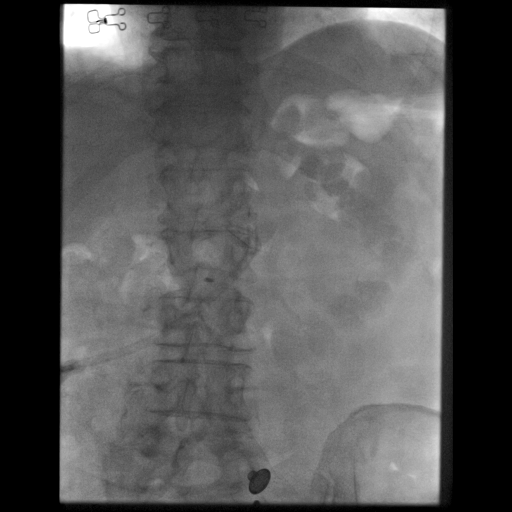
[frame 9/56]
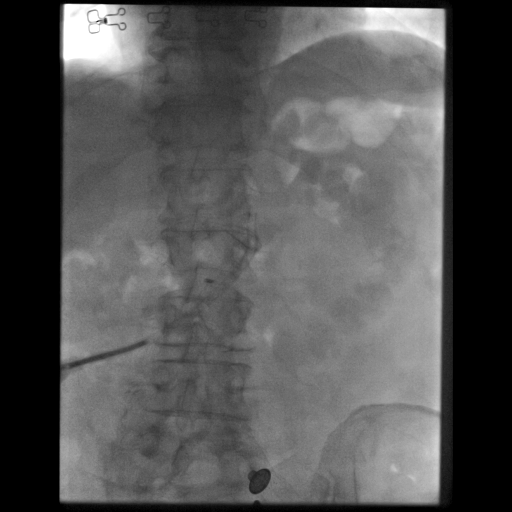
[frame 29/56]
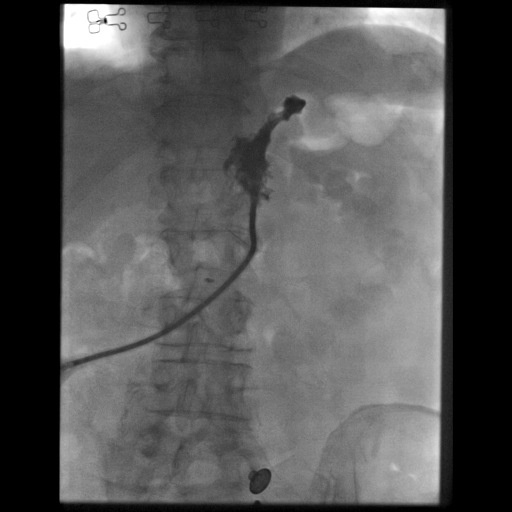
[frame 48/56]
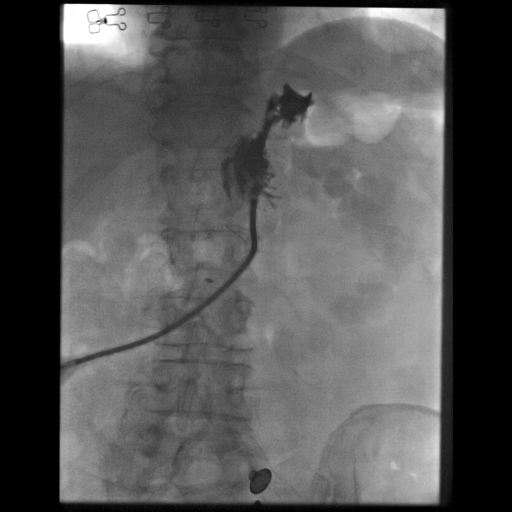

[Series 2: fl - angio · 4 of 84 frames shown (2 of 2)]
[frame 13/84]
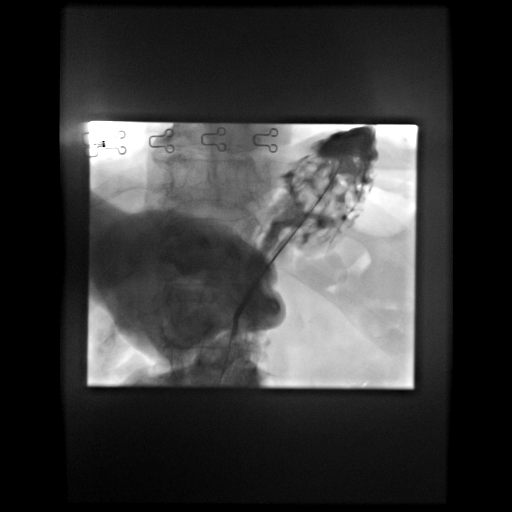
[frame 43/84]
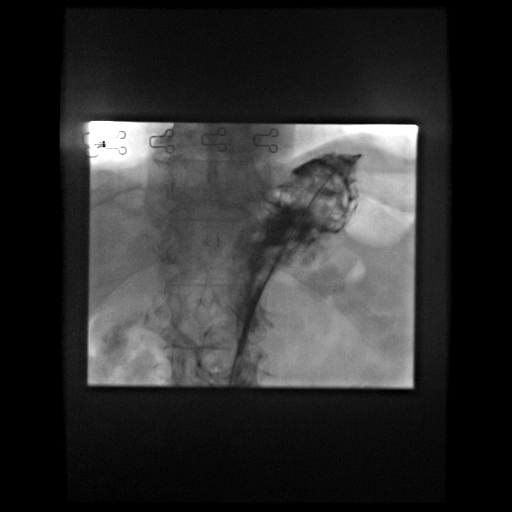
[frame 72/84]
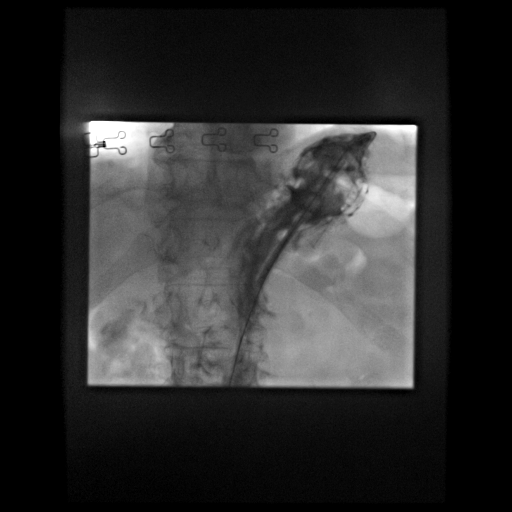
[frame 75/84]
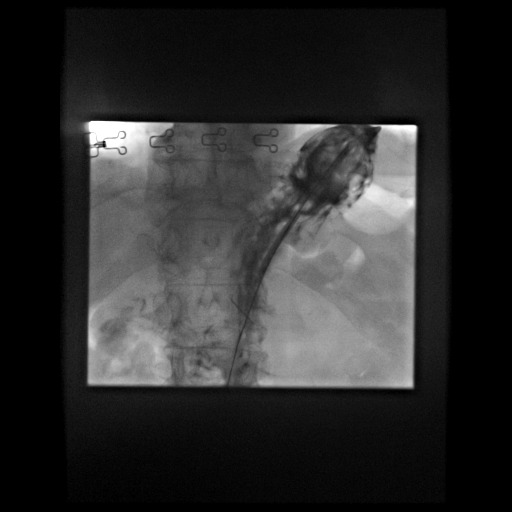

[Series 3: care body 4 · 3 of 4 frames shown]
[frame 1/4]
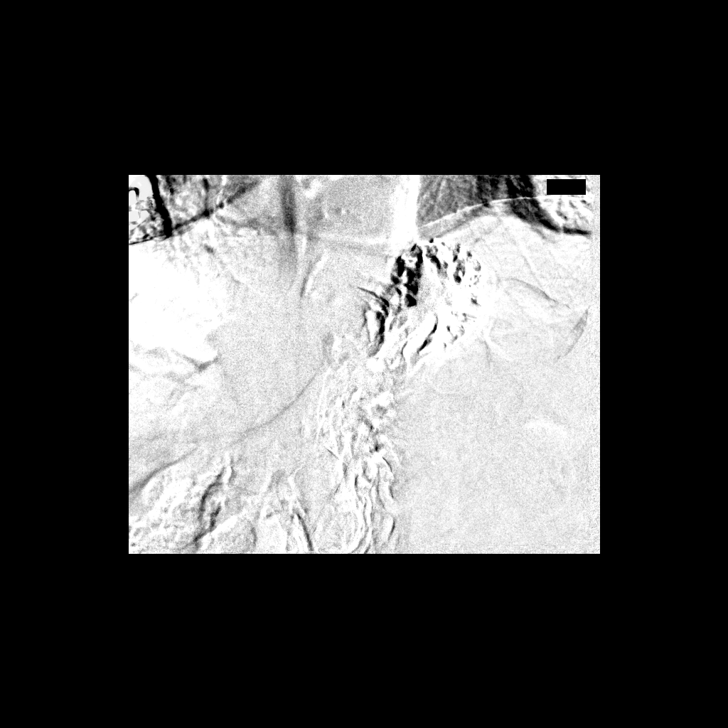
[frame 3/4]
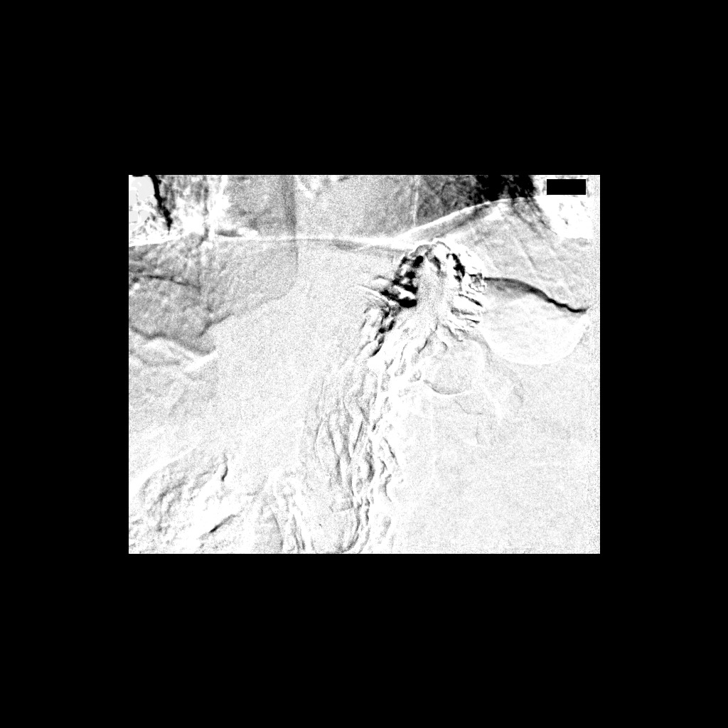
[frame 4/4]
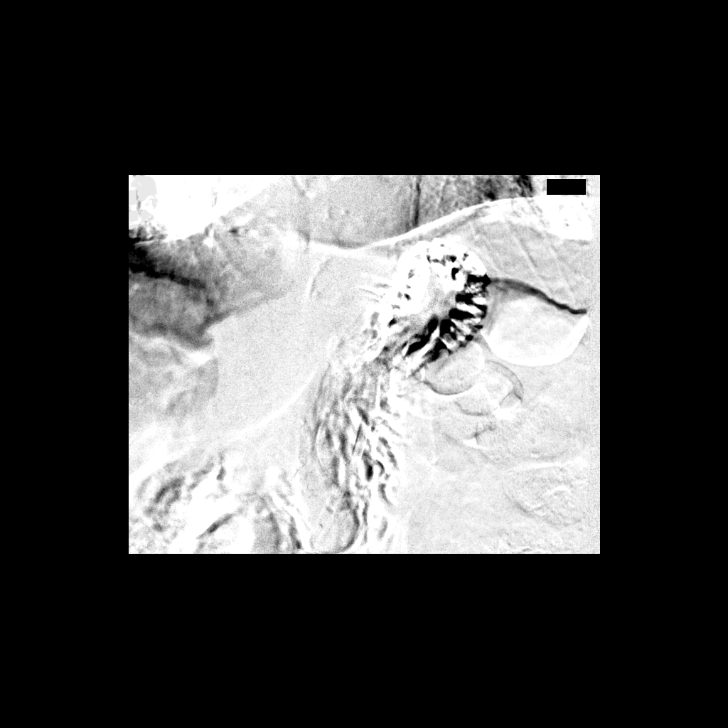

[Series 300: ld dsa body · 2 of 2 slices shown]
[im 1/2]
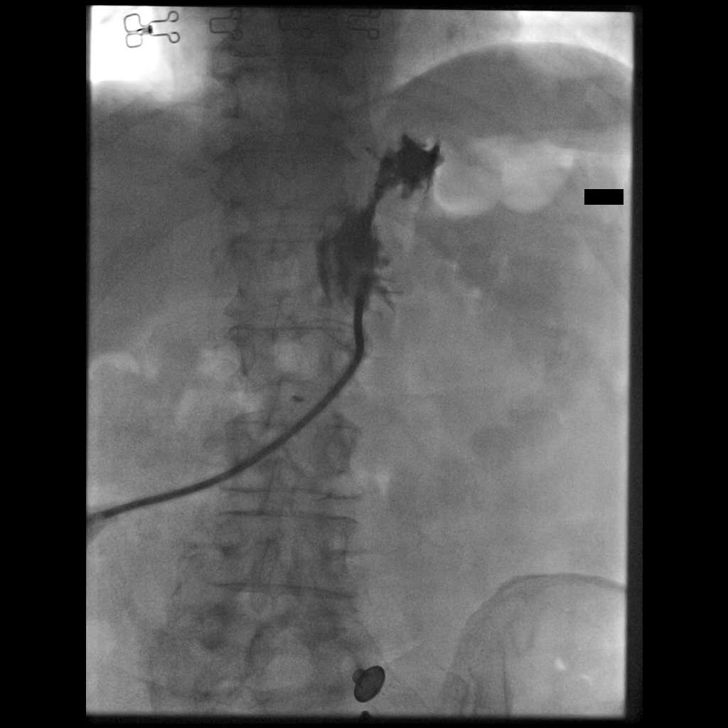
[im 2/2]
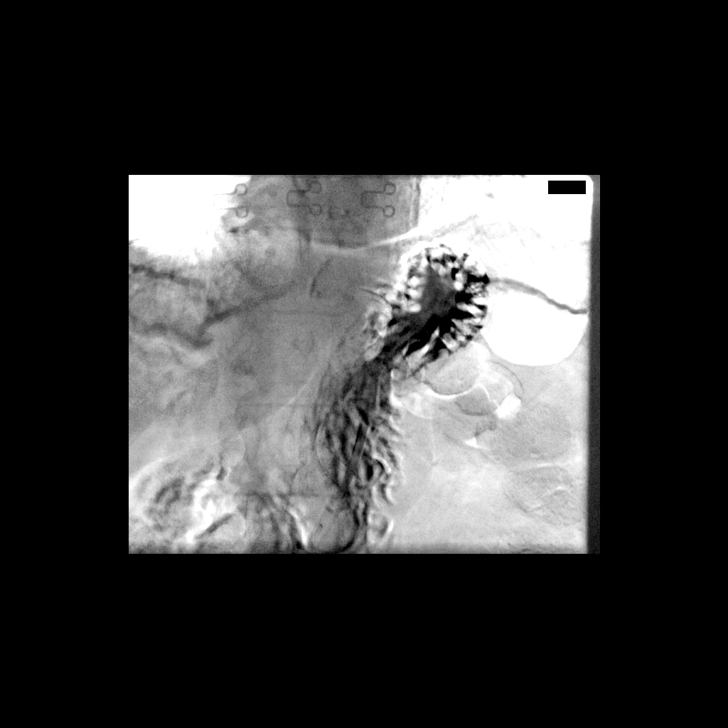

[13 of 13 positions shown; findings below may reference images not displayed]

EXAM:
Fluoroscopic gastrostomy tube evaluation.

MEDICATIONS:
None

ANESTHESIA/SEDATION:
None

CONTRAST:  20 mL Omnipaque 300-administered into the gastric lumen.

FLUOROSCOPY TIME:  Fluoroscopy Time: 1 minutes 6 seconds (26 mGy).

COMPLICATIONS:
None immediate.

PROCEDURE:
Informed written consent was obtained from the patient after a
thorough discussion of the procedural risks, benefits and
alternatives.

Patient positioned supine on the angiography table. Scout image
demonstrated gastrostomy tube in appropriate position. Contrast
administered through the tube under fluoroscopy confirmed
appropriate positioning within the gastric lumen. The balloon was
advanced easily into the gastric lumen confirming that it was not
inflated within the wall of the stomach. The balloon was deflated
while in the gastric fundus and reinflated with normal saline and
retracted to the anterior abdominal wall.
IMPRESSION: Fluoroscopic evaluation of gastrostomy tube demonstrates appropriate
positioning of the catheter tip and balloon.

## 2023-03-15 ENCOUNTER — Inpatient Hospital Stay: Admit: 2023-03-15 | Discharge: 2023-03-16 | Payer: MEDICARE

## 2023-03-15 ENCOUNTER — Ambulatory Visit: Admit: 2023-03-15 | Discharge: 2023-03-16 | Payer: MEDICARE

## 2023-03-15 DIAGNOSIS — S82831D Other fracture of upper and lower end of right fibula, subsequent encounter for closed fracture with routine healing: Principal | ICD-10-CM

## 2023-03-15 DIAGNOSIS — S82244D Nondisplaced spiral fracture of shaft of right tibia, subsequent encounter for closed fracture with routine healing: Principal | ICD-10-CM

## 2023-03-29 ENCOUNTER — Inpatient Hospital Stay: Admit: 2023-03-29 | Discharge: 2023-03-30 | Payer: MEDICARE

## 2023-04-02 ENCOUNTER — Inpatient Hospital Stay: Admit: 2023-04-02 | Discharge: 2023-04-02 | Payer: MEDICARE

## 2023-04-02 DIAGNOSIS — L97529 Non-pressure chronic ulcer of other part of left foot with unspecified severity: Principal | ICD-10-CM

## 2023-04-10 ENCOUNTER — Ambulatory Visit
Admit: 2023-04-10 | Discharge: 2023-04-11 | Payer: MEDICARE | Attending: Student in an Organized Health Care Education/Training Program | Primary: Student in an Organized Health Care Education/Training Program

## 2023-04-10 DIAGNOSIS — I2510 Atherosclerotic heart disease of native coronary artery without angina pectoris: Principal | ICD-10-CM

## 2023-04-16 ENCOUNTER — Inpatient Hospital Stay: Admit: 2023-04-16 | Discharge: 2023-04-17 | Payer: MEDICARE

## 2023-04-18 ENCOUNTER — Encounter
Admit: 2023-04-18 | Discharge: 2023-04-19 | Payer: MEDICARE | Attending: Student in an Organized Health Care Education/Training Program | Primary: Student in an Organized Health Care Education/Training Program

## 2023-04-23 ENCOUNTER — Inpatient Hospital Stay: Admit: 2023-04-23 | Discharge: 2023-04-24 | Payer: MEDICARE

## 2023-04-25 ENCOUNTER — Ambulatory Visit
Admit: 2023-04-25 | Discharge: 2023-04-26 | Payer: MEDICARE | Attending: Student in an Organized Health Care Education/Training Program | Primary: Student in an Organized Health Care Education/Training Program

## 2023-04-25 DIAGNOSIS — I70223 Atherosclerosis of native arteries of extremities with rest pain, bilateral legs: Principal | ICD-10-CM

## 2023-04-25 DIAGNOSIS — I739 Peripheral vascular disease, unspecified: Principal | ICD-10-CM

## 2023-04-25 DIAGNOSIS — I70209 Unspecified atherosclerosis of native arteries of extremities, unspecified extremity: Principal | ICD-10-CM

## 2023-04-25 DIAGNOSIS — J42 Unspecified chronic bronchitis: Principal | ICD-10-CM

## 2023-04-25 DIAGNOSIS — E782 Mixed hyperlipidemia: Principal | ICD-10-CM

## 2023-04-25 DIAGNOSIS — F17200 Nicotine dependence, unspecified, uncomplicated: Principal | ICD-10-CM

## 2023-04-27 DIAGNOSIS — I739 Peripheral vascular disease, unspecified: Principal | ICD-10-CM

## 2023-04-30 ENCOUNTER — Ambulatory Visit: Admit: 2023-04-30 | Discharge: 2023-05-01 | Payer: MEDICARE

## 2023-05-01 ENCOUNTER — Encounter
Admit: 2023-05-01 | Discharge: 2023-05-02 | Payer: MEDICARE | Attending: Student in an Organized Health Care Education/Training Program | Primary: Student in an Organized Health Care Education/Training Program

## 2023-05-01 ENCOUNTER — Inpatient Hospital Stay: Admit: 2023-05-01 | Discharge: 2023-05-02 | Payer: MEDICARE

## 2023-05-01 ENCOUNTER — Ambulatory Visit: Admit: 2023-05-01 | Discharge: 2023-05-02 | Payer: MEDICARE

## 2023-05-01 ENCOUNTER — Encounter: Admit: 2023-05-01 | Discharge: 2023-05-02 | Payer: MEDICARE | Attending: Anesthesiology | Primary: Anesthesiology

## 2023-05-02 MED ORDER — APIXABAN 5 MG TABLET
ORAL_TABLET | Freq: Two times a day (BID) | ORAL | 5 refills | 30.00 days | Status: CP
Start: 2023-05-02 — End: 2023-05-02

## 2023-05-02 MED ORDER — ACETAMINOPHEN 500 MG TABLET
ORAL_TABLET | Freq: Three times a day (TID) | ORAL | 0 refills | 5.00 days | Status: CP
Start: 2023-05-02 — End: 2023-05-02

## 2023-05-02 MED ORDER — ELIQUIS 5 MG TABLET
ORAL_TABLET | Freq: Two times a day (BID) | ORAL | 0 refills | 30.00 days | Status: CP
Start: 2023-05-02 — End: 2023-05-02

## 2023-05-02 MED ORDER — HYDROCORTISONE 2.5 % TOPICAL CREAM
Freq: Two times a day (BID) | TOPICAL | 0 refills | 7.00 days | Status: CP
Start: 2023-05-02 — End: 2023-05-09

## 2023-05-02 MED ORDER — OXYCODONE 5 MG TABLET
ORAL_TABLET | Freq: Three times a day (TID) | ORAL | 0 refills | 4.00 days | Status: CP | PRN
Start: 2023-05-02 — End: 2023-05-02

## 2023-05-02 MED ORDER — CLOPIDOGREL 75 MG TABLET
ORAL_TABLET | Freq: Every day | ORAL | 5 refills | 30.00 days | Status: CP
Start: 2023-05-02 — End: 2023-05-02

## 2023-05-16 ENCOUNTER — Ambulatory Visit: Admit: 2023-05-16 | Discharge: 2023-05-16 | Payer: MEDICARE

## 2023-05-16 ENCOUNTER — Ambulatory Visit
Admit: 2023-05-16 | Discharge: 2023-05-16 | Payer: MEDICARE | Attending: Student in an Organized Health Care Education/Training Program | Primary: Student in an Organized Health Care Education/Training Program

## 2023-05-16 DIAGNOSIS — Z79899 Other long term (current) drug therapy: Principal | ICD-10-CM

## 2023-05-16 DIAGNOSIS — I70209 Unspecified atherosclerosis of native arteries of extremities, unspecified extremity: Principal | ICD-10-CM

## 2023-05-16 DIAGNOSIS — J42 Unspecified chronic bronchitis: Principal | ICD-10-CM

## 2023-05-16 DIAGNOSIS — I739 Peripheral vascular disease, unspecified: Principal | ICD-10-CM

## 2023-05-16 DIAGNOSIS — I70223 Atherosclerosis of native arteries of extremities with rest pain, bilateral legs: Principal | ICD-10-CM

## 2023-05-16 DIAGNOSIS — F17200 Nicotine dependence, unspecified, uncomplicated: Principal | ICD-10-CM

## 2023-05-16 DIAGNOSIS — R5383 Other fatigue: Principal | ICD-10-CM

## 2023-05-16 DIAGNOSIS — R0600 Dyspnea, unspecified: Principal | ICD-10-CM

## 2023-05-16 DIAGNOSIS — I10 Essential (primary) hypertension: Principal | ICD-10-CM

## 2023-05-23 ENCOUNTER — Ambulatory Visit
Admit: 2023-05-23 | Discharge: 2023-05-24 | Payer: MEDICARE | Attending: Student in an Organized Health Care Education/Training Program | Primary: Student in an Organized Health Care Education/Training Program

## 2023-05-23 DIAGNOSIS — Z8589 Personal history of malignant neoplasm of other organs and systems: Principal | ICD-10-CM

## 2023-05-23 DIAGNOSIS — J310 Chronic rhinitis: Principal | ICD-10-CM

## 2023-05-23 MED ORDER — FLUTICASONE PROPIONATE 50 MCG/ACTUATION NASAL SPRAY,SUSPENSION
Freq: Two times a day (BID) | NASAL | 6 refills | 30 days | Status: CP
Start: 2023-05-23 — End: 2024-05-22

## 2023-05-28 MED ORDER — PREDNISONE 20 MG TABLET
ORAL_TABLET | Freq: Every day | ORAL | 0 refills | 4 days | Status: CN
Start: 2023-05-28 — End: 2023-06-01

## 2023-06-06 ENCOUNTER — Ambulatory Visit
Admit: 2023-06-06 | Discharge: 2023-06-07 | Attending: Student in an Organized Health Care Education/Training Program | Primary: Student in an Organized Health Care Education/Training Program

## 2023-06-06 ENCOUNTER — Ambulatory Visit: Admit: 2023-06-06 | Discharge: 2023-06-07

## 2023-06-06 DIAGNOSIS — I70223 Atherosclerosis of native arteries of extremities with rest pain, bilateral legs: Principal | ICD-10-CM

## 2023-06-06 DIAGNOSIS — I739 Peripheral vascular disease, unspecified: Principal | ICD-10-CM

## 2023-06-23 DIAGNOSIS — E785 Hyperlipidemia, unspecified: Principal | ICD-10-CM

## 2023-06-23 DIAGNOSIS — I2510 Atherosclerotic heart disease of native coronary artery without angina pectoris: Principal | ICD-10-CM

## 2023-06-23 MED ORDER — REPATHA SURECLICK 140 MG/ML SUBCUTANEOUS PEN INJECTOR
0 refills | 0.00000 days
Start: 2023-06-23 — End: ?

## 2023-06-25 MED ORDER — REPATHA SURECLICK 140 MG/ML SUBCUTANEOUS PEN INJECTOR
SUBCUTANEOUS | 0 refills | 0.00000 days | Status: CP
Start: 2023-06-25 — End: ?

## 2023-06-27 ENCOUNTER — Ambulatory Visit
Admit: 2023-06-27 | Discharge: 2023-06-28 | Payer: Medicare (Managed Care) | Attending: Student in an Organized Health Care Education/Training Program | Primary: Student in an Organized Health Care Education/Training Program

## 2023-06-27 ENCOUNTER — Inpatient Hospital Stay: Admit: 2023-06-27 | Discharge: 2023-06-28 | Payer: Medicare (Managed Care)

## 2023-06-27 DIAGNOSIS — I739 Peripheral vascular disease, unspecified: Principal | ICD-10-CM

## 2023-06-27 DIAGNOSIS — F17200 Nicotine dependence, unspecified, uncomplicated: Principal | ICD-10-CM

## 2023-06-27 DIAGNOSIS — I70223 Atherosclerosis of native arteries of extremities with rest pain, bilateral legs: Principal | ICD-10-CM

## 2023-07-27 ENCOUNTER — Ambulatory Visit: Admit: 2023-07-27 | Discharge: 2023-07-28 | Payer: Medicare (Managed Care)

## 2023-07-27 ENCOUNTER — Ambulatory Visit
Admit: 2023-07-27 | Discharge: 2023-07-28 | Payer: Medicare (Managed Care) | Attending: Student in an Organized Health Care Education/Training Program | Primary: Student in an Organized Health Care Education/Training Program

## 2023-07-27 DIAGNOSIS — Z8589 Personal history of malignant neoplasm of other organs and systems: Principal | ICD-10-CM

## 2023-07-27 DIAGNOSIS — C109 Malignant neoplasm of oropharynx, unspecified: Principal | ICD-10-CM

## 2023-07-27 DIAGNOSIS — J449 Chronic obstructive pulmonary disease, unspecified: Principal | ICD-10-CM

## 2023-07-27 DIAGNOSIS — H6590 Unspecified nonsuppurative otitis media, unspecified ear: Principal | ICD-10-CM

## 2023-08-02 ENCOUNTER — Ambulatory Visit
Admit: 2023-08-02 | Discharge: 2023-08-03 | Payer: Medicare (Managed Care) | Attending: Audiologist | Primary: Audiologist

## 2023-08-02 DIAGNOSIS — H9190 Unspecified hearing loss, unspecified ear: Principal | ICD-10-CM

## 2023-08-07 ENCOUNTER — Ambulatory Visit: Admit: 2023-08-07 | Discharge: 2023-08-08 | Payer: Medicare (Managed Care)

## 2023-08-07 DIAGNOSIS — H6590 Unspecified nonsuppurative otitis media, unspecified ear: Principal | ICD-10-CM

## 2023-08-07 DIAGNOSIS — H6121 Impacted cerumen, right ear: Principal | ICD-10-CM

## 2023-08-07 DIAGNOSIS — H6521 Chronic serous otitis media, right ear: Principal | ICD-10-CM

## 2023-08-07 DIAGNOSIS — H903 Sensorineural hearing loss, bilateral: Principal | ICD-10-CM

## 2023-08-07 MED ORDER — METHYLPREDNISOLONE 4 MG TABLETS IN A DOSE PACK
ORAL | 0 refills | 0.00000 days | Status: CP
Start: 2023-08-07 — End: ?

## 2023-08-07 MED ORDER — LEVOFLOXACIN 750 MG TABLET
ORAL_TABLET | Freq: Every day | ORAL | 0 refills | 14.00000 days | Status: CP
Start: 2023-08-07 — End: 2023-08-21

## 2023-08-21 ENCOUNTER — Encounter: Payer: Self-pay | Admitting: Hematology and Oncology

## 2023-09-10 DIAGNOSIS — I2510 Atherosclerotic heart disease of native coronary artery without angina pectoris: Principal | ICD-10-CM

## 2023-09-10 DIAGNOSIS — E785 Hyperlipidemia, unspecified: Principal | ICD-10-CM

## 2023-09-10 MED ORDER — REPATHA SURECLICK 140 MG/ML SUBCUTANEOUS PEN INJECTOR
SUBCUTANEOUS | 0 refills | 0.00000 days | Status: CP
Start: 2023-09-10 — End: ?

## 2023-09-13 ENCOUNTER — Encounter: Payer: Self-pay | Admitting: Hematology and Oncology

## 2023-09-19 ENCOUNTER — Telehealth: Payer: Self-pay

## 2023-09-20 ENCOUNTER — Inpatient Hospital Stay: Payer: Medicare HMO | Attending: Hematology and Oncology | Admitting: Hematology and Oncology

## 2023-09-26 ENCOUNTER — Ambulatory Visit
Admit: 2023-09-26 | Discharge: 2023-09-26 | Payer: Medicare (Managed Care) | Attending: Student in an Organized Health Care Education/Training Program | Primary: Student in an Organized Health Care Education/Training Program

## 2023-09-26 ENCOUNTER — Inpatient Hospital Stay: Admit: 2023-09-26 | Discharge: 2023-09-26 | Payer: Medicare (Managed Care)

## 2023-09-26 DIAGNOSIS — I739 Peripheral vascular disease, unspecified: Principal | ICD-10-CM

## 2023-09-26 DIAGNOSIS — F17200 Nicotine dependence, unspecified, uncomplicated: Principal | ICD-10-CM

## 2023-09-26 DIAGNOSIS — I70223 Atherosclerosis of native arteries of extremities with rest pain, bilateral legs: Principal | ICD-10-CM

## 2023-10-02 ENCOUNTER — Inpatient Hospital Stay: Admit: 2023-10-02 | Discharge: 2023-10-02 | Payer: Medicare (Managed Care)

## 2023-10-19 DIAGNOSIS — J310 Chronic rhinitis: Principal | ICD-10-CM

## 2023-10-19 DIAGNOSIS — C109 Malignant neoplasm of oropharynx, unspecified: Principal | ICD-10-CM

## 2023-11-28 MED ORDER — APIXABAN 5 MG TABLET
ORAL_TABLET | Freq: Two times a day (BID) | ORAL | 5 refills | 30.00000 days | Status: CP
Start: 2023-11-28 — End: ?

## 2023-12-04 DIAGNOSIS — E785 Hyperlipidemia, unspecified: Principal | ICD-10-CM

## 2023-12-04 DIAGNOSIS — I2510 Atherosclerotic heart disease of native coronary artery without angina pectoris: Principal | ICD-10-CM

## 2023-12-04 MED ORDER — REPATHA SURECLICK 140 MG/ML SUBCUTANEOUS PEN INJECTOR
0 refills | 0.00000 days
Start: 2023-12-04 — End: ?

## 2024-01-01 ENCOUNTER — Ambulatory Visit
Admit: 2024-01-01 | Discharge: 2024-01-02 | Payer: Medicare (Managed Care) | Attending: Student in an Organized Health Care Education/Training Program | Primary: Student in an Organized Health Care Education/Training Program

## 2024-01-01 DIAGNOSIS — I10 Essential (primary) hypertension: Principal | ICD-10-CM

## 2024-01-01 DIAGNOSIS — I2510 Atherosclerotic heart disease of native coronary artery without angina pectoris: Principal | ICD-10-CM

## 2024-01-01 DIAGNOSIS — R0600 Dyspnea, unspecified: Principal | ICD-10-CM

## 2024-01-01 DIAGNOSIS — I5189 Other ill-defined heart diseases: Principal | ICD-10-CM

## 2024-01-01 DIAGNOSIS — E785 Hyperlipidemia, unspecified: Principal | ICD-10-CM

## 2024-01-01 MED ORDER — REPATHA SURECLICK 140 MG/ML SUBCUTANEOUS PEN INJECTOR
SUBCUTANEOUS | 3 refills | 84.00000 days | Status: CP
Start: 2024-01-01 — End: ?

## 2024-01-23 ENCOUNTER — Inpatient Hospital Stay: Admit: 2024-01-23 | Discharge: 2024-01-23 | Payer: Medicare (Managed Care)

## 2024-01-23 DIAGNOSIS — J310 Chronic rhinitis: Principal | ICD-10-CM

## 2024-01-23 DIAGNOSIS — R911 Solitary pulmonary nodule: Principal | ICD-10-CM

## 2024-03-11 ENCOUNTER — Ambulatory Visit
Admit: 2024-03-11 | Discharge: 2024-03-12 | Payer: Medicare (Managed Care) | Attending: Pulmonary Disease | Primary: Pulmonary Disease

## 2024-04-02 ENCOUNTER — Inpatient Hospital Stay: Admit: 2024-04-02 | Discharge: 2024-04-02 | Payer: Medicare (Managed Care)

## 2024-04-02 ENCOUNTER — Ambulatory Visit
Admit: 2024-04-02 | Discharge: 2024-04-02 | Payer: Medicare (Managed Care) | Attending: Student in an Organized Health Care Education/Training Program | Primary: Student in an Organized Health Care Education/Training Program

## 2024-04-02 DIAGNOSIS — I70223 Atherosclerosis of native arteries of extremities with rest pain, bilateral legs: Secondary | ICD-10-CM

## 2024-04-02 DIAGNOSIS — F17200 Nicotine dependence, unspecified, uncomplicated: Secondary | ICD-10-CM

## 2024-04-02 DIAGNOSIS — I739 Peripheral vascular disease, unspecified: Principal | ICD-10-CM
